# Patient Record
Sex: Female | Born: 1955 | Race: White | Hispanic: No | Marital: Married | Smoking: Current some day smoker
Health system: Southern US, Community
[De-identification: ages and names within clinical notes are randomized; demographics above are authoritative.]

## PROBLEM LIST (undated history)

## (undated) DIAGNOSIS — F410 Panic disorder [episodic paroxysmal anxiety] without agoraphobia: Secondary | ICD-10-CM

## (undated) DIAGNOSIS — N189 Chronic kidney disease, unspecified: Secondary | ICD-10-CM

## (undated) DIAGNOSIS — F319 Bipolar disorder, unspecified: Secondary | ICD-10-CM

## (undated) DIAGNOSIS — N1330 Unspecified hydronephrosis: Secondary | ICD-10-CM

## (undated) DIAGNOSIS — E785 Hyperlipidemia, unspecified: Secondary | ICD-10-CM

## (undated) DIAGNOSIS — R739 Hyperglycemia, unspecified: Secondary | ICD-10-CM

## (undated) DIAGNOSIS — K219 Gastro-esophageal reflux disease without esophagitis: Secondary | ICD-10-CM

## (undated) DIAGNOSIS — R319 Hematuria, unspecified: Secondary | ICD-10-CM

## (undated) DIAGNOSIS — J302 Other seasonal allergic rhinitis: Secondary | ICD-10-CM

## (undated) DIAGNOSIS — F329 Major depressive disorder, single episode, unspecified: Secondary | ICD-10-CM

## (undated) DIAGNOSIS — H269 Unspecified cataract: Secondary | ICD-10-CM

## (undated) DIAGNOSIS — E7800 Pure hypercholesterolemia, unspecified: Secondary | ICD-10-CM

## (undated) DIAGNOSIS — N289 Disorder of kidney and ureter, unspecified: Secondary | ICD-10-CM

## (undated) DIAGNOSIS — N6019 Diffuse cystic mastopathy of unspecified breast: Secondary | ICD-10-CM

## (undated) DIAGNOSIS — I10 Essential (primary) hypertension: Secondary | ICD-10-CM

## (undated) DIAGNOSIS — E876 Hypokalemia: Secondary | ICD-10-CM

## (undated) DIAGNOSIS — T7840XA Allergy, unspecified, initial encounter: Secondary | ICD-10-CM

## (undated) HISTORY — DX: Unspecified hydronephrosis: N13.30

## (undated) HISTORY — DX: Allergy, unspecified, initial encounter: T78.40XA

## (undated) HISTORY — DX: Disorder of kidney and ureter, unspecified: N28.9

## (undated) HISTORY — DX: Major depressive disorder, single episode, unspecified: F32.9

## (undated) HISTORY — DX: Diffuse cystic mastopathy of unspecified breast: N60.19

## (undated) HISTORY — DX: Unspecified cataract: H26.9

## (undated) HISTORY — DX: Hematuria, unspecified: R31.9

## (undated) HISTORY — DX: Chronic kidney disease, unspecified: N18.9

## (undated) HISTORY — DX: Bipolar disorder, unspecified: F31.9

## (undated) HISTORY — PX: ABDOMINAL HYSTERECTOMY: SHX81

## (undated) HISTORY — DX: Pure hypercholesterolemia, unspecified: E78.00

## (undated) HISTORY — PX: CHOLECYSTECTOMY: SHX55

## (undated) HISTORY — DX: Hyperlipidemia, unspecified: E78.5

## (undated) HISTORY — DX: Hypokalemia: E87.6

## (undated) HISTORY — PX: EYE SURGERY: SHX253

## (undated) HISTORY — DX: Hyperglycemia, unspecified: R73.9

## (undated) HISTORY — PX: KIDNEY SURGERY: SHX687

## (undated) HISTORY — DX: Hypercalcemia: E83.52

## (undated) HISTORY — DX: Essential (primary) hypertension: I10

## (undated) HISTORY — PX: TUBAL LIGATION: SHX77

## (undated) HISTORY — DX: Panic disorder (episodic paroxysmal anxiety): F41.0

---

## 1994-05-04 HISTORY — PX: OOPHORECTOMY: SHX86

## 2008-08-24 DIAGNOSIS — I10 Essential (primary) hypertension: Secondary | ICD-10-CM

## 2008-08-24 DIAGNOSIS — F329 Major depressive disorder, single episode, unspecified: Secondary | ICD-10-CM

## 2008-08-24 DIAGNOSIS — F32A Depression, unspecified: Secondary | ICD-10-CM

## 2008-08-24 DIAGNOSIS — R55 Syncope and collapse: Secondary | ICD-10-CM | POA: Insufficient documentation

## 2008-08-24 DIAGNOSIS — G4700 Insomnia, unspecified: Secondary | ICD-10-CM | POA: Insufficient documentation

## 2008-08-24 DIAGNOSIS — R319 Hematuria, unspecified: Secondary | ICD-10-CM

## 2008-08-24 DIAGNOSIS — N809 Endometriosis, unspecified: Secondary | ICD-10-CM | POA: Insufficient documentation

## 2008-08-24 DIAGNOSIS — E785 Hyperlipidemia, unspecified: Secondary | ICD-10-CM

## 2008-08-24 DIAGNOSIS — F319 Bipolar disorder, unspecified: Secondary | ICD-10-CM

## 2008-08-24 DIAGNOSIS — N189 Chronic kidney disease, unspecified: Secondary | ICD-10-CM | POA: Insufficient documentation

## 2008-08-24 HISTORY — DX: Essential (primary) hypertension: I10

## 2008-08-24 HISTORY — DX: Major depressive disorder, single episode, unspecified: F32.9

## 2008-08-24 HISTORY — DX: Bipolar disorder, unspecified: F31.9

## 2008-08-24 HISTORY — DX: Hyperlipidemia, unspecified: E78.5

## 2008-08-24 HISTORY — DX: Depression, unspecified: F32.A

## 2008-08-24 HISTORY — DX: Hematuria, unspecified: R31.9

## 2008-09-21 DIAGNOSIS — F17200 Nicotine dependence, unspecified, uncomplicated: Secondary | ICD-10-CM | POA: Insufficient documentation

## 2009-01-29 DIAGNOSIS — E7800 Pure hypercholesterolemia, unspecified: Secondary | ICD-10-CM

## 2009-01-29 HISTORY — DX: Pure hypercholesterolemia, unspecified: E78.00

## 2009-02-07 DIAGNOSIS — Z09 Encounter for follow-up examination after completed treatment for conditions other than malignant neoplasm: Secondary | ICD-10-CM | POA: Insufficient documentation

## 2009-02-07 DIAGNOSIS — E559 Vitamin D deficiency, unspecified: Secondary | ICD-10-CM | POA: Insufficient documentation

## 2009-03-12 ENCOUNTER — Ambulatory Visit: Payer: Self-pay | Admitting: Internal Medicine

## 2009-07-19 DIAGNOSIS — N399 Disorder of urinary system, unspecified: Secondary | ICD-10-CM | POA: Insufficient documentation

## 2009-07-19 DIAGNOSIS — N289 Disorder of kidney and ureter, unspecified: Secondary | ICD-10-CM

## 2009-07-19 HISTORY — DX: Hypercalcemia: E83.52

## 2009-07-19 HISTORY — DX: Disorder of kidney and ureter, unspecified: N28.9

## 2010-05-04 HISTORY — PX: BREAST BIOPSY: SHX20

## 2010-07-07 ENCOUNTER — Ambulatory Visit: Payer: Self-pay

## 2010-09-22 ENCOUNTER — Ambulatory Visit: Payer: Self-pay | Admitting: General Surgery

## 2010-09-23 LAB — PATHOLOGY REPORT

## 2011-08-13 ENCOUNTER — Ambulatory Visit: Payer: Self-pay | Admitting: Family Medicine

## 2012-05-04 HISTORY — PX: BREAST BIOPSY: SHX20

## 2012-08-29 DIAGNOSIS — I10 Essential (primary) hypertension: Secondary | ICD-10-CM | POA: Insufficient documentation

## 2012-08-29 DIAGNOSIS — N281 Cyst of kidney, acquired: Secondary | ICD-10-CM | POA: Insufficient documentation

## 2012-08-29 DIAGNOSIS — Z8639 Personal history of other endocrine, nutritional and metabolic disease: Secondary | ICD-10-CM | POA: Insufficient documentation

## 2012-08-29 DIAGNOSIS — N182 Chronic kidney disease, stage 2 (mild): Secondary | ICD-10-CM | POA: Insufficient documentation

## 2012-11-18 ENCOUNTER — Ambulatory Visit: Payer: Self-pay

## 2013-07-14 LAB — HEPATIC FUNCTION PANEL
ALT: 20 U/L (ref 7–35)
AST: 24 U/L (ref 13–35)

## 2013-07-14 LAB — BASIC METABOLIC PANEL
BUN: 17 mg/dL (ref 4–21)
Creatinine: 1.3 mg/dL — AB (ref 0.5–1.1)
Glucose: 110 mg/dL
POTASSIUM: 3.6 mmol/L (ref 3.4–5.3)
Sodium: 140 mmol/L (ref 137–147)

## 2013-07-14 LAB — LIPID PANEL
Cholesterol: 190 mg/dL (ref 0–200)
HDL: 51 mg/dL (ref 35–70)
LDL CALC: 102 mg/dL
Triglycerides: 187 mg/dL — AB (ref 40–160)

## 2013-07-14 LAB — HEMOGLOBIN A1C: Hgb A1c MFr Bld: 5.2 % (ref 4.0–6.0)

## 2014-12-06 DIAGNOSIS — J309 Allergic rhinitis, unspecified: Secondary | ICD-10-CM | POA: Insufficient documentation

## 2014-12-06 DIAGNOSIS — N6019 Diffuse cystic mastopathy of unspecified breast: Secondary | ICD-10-CM | POA: Insufficient documentation

## 2014-12-06 DIAGNOSIS — M79673 Pain in unspecified foot: Secondary | ICD-10-CM | POA: Insufficient documentation

## 2014-12-06 DIAGNOSIS — R739 Hyperglycemia, unspecified: Secondary | ICD-10-CM | POA: Insufficient documentation

## 2014-12-06 DIAGNOSIS — F419 Anxiety disorder, unspecified: Secondary | ICD-10-CM | POA: Insufficient documentation

## 2014-12-06 DIAGNOSIS — R799 Abnormal finding of blood chemistry, unspecified: Secondary | ICD-10-CM | POA: Insufficient documentation

## 2014-12-06 DIAGNOSIS — N289 Disorder of kidney and ureter, unspecified: Secondary | ICD-10-CM | POA: Insufficient documentation

## 2014-12-06 DIAGNOSIS — N261 Atrophy of kidney (terminal): Secondary | ICD-10-CM | POA: Insufficient documentation

## 2014-12-06 DIAGNOSIS — Z8659 Personal history of other mental and behavioral disorders: Secondary | ICD-10-CM | POA: Insufficient documentation

## 2014-12-06 DIAGNOSIS — R7989 Other specified abnormal findings of blood chemistry: Secondary | ICD-10-CM | POA: Insufficient documentation

## 2014-12-06 DIAGNOSIS — B379 Candidiasis, unspecified: Secondary | ICD-10-CM | POA: Insufficient documentation

## 2014-12-06 DIAGNOSIS — R031 Nonspecific low blood-pressure reading: Secondary | ICD-10-CM | POA: Insufficient documentation

## 2014-12-06 DIAGNOSIS — R0602 Shortness of breath: Secondary | ICD-10-CM | POA: Insufficient documentation

## 2014-12-06 DIAGNOSIS — Z87448 Personal history of other diseases of urinary system: Secondary | ICD-10-CM | POA: Insufficient documentation

## 2014-12-06 DIAGNOSIS — R928 Other abnormal and inconclusive findings on diagnostic imaging of breast: Secondary | ICD-10-CM | POA: Insufficient documentation

## 2014-12-06 DIAGNOSIS — R945 Abnormal results of liver function studies: Secondary | ICD-10-CM | POA: Insufficient documentation

## 2014-12-06 DIAGNOSIS — E7800 Pure hypercholesterolemia, unspecified: Secondary | ICD-10-CM | POA: Insufficient documentation

## 2014-12-06 DIAGNOSIS — E876 Hypokalemia: Secondary | ICD-10-CM | POA: Insufficient documentation

## 2014-12-06 DIAGNOSIS — R109 Unspecified abdominal pain: Secondary | ICD-10-CM | POA: Insufficient documentation

## 2014-12-07 ENCOUNTER — Encounter: Payer: Self-pay | Admitting: Physician Assistant

## 2014-12-07 ENCOUNTER — Ambulatory Visit (INDEPENDENT_AMBULATORY_CARE_PROVIDER_SITE_OTHER): Payer: Managed Care, Other (non HMO) | Admitting: Physician Assistant

## 2014-12-07 VITALS — BP 104/70 | HR 70 | Temp 97.6°F | Resp 16 | Wt 179.0 lb

## 2014-12-07 DIAGNOSIS — N182 Chronic kidney disease, stage 2 (mild): Secondary | ICD-10-CM | POA: Diagnosis not present

## 2014-12-07 DIAGNOSIS — R739 Hyperglycemia, unspecified: Secondary | ICD-10-CM

## 2014-12-07 DIAGNOSIS — R5383 Other fatigue: Secondary | ICD-10-CM

## 2014-12-07 DIAGNOSIS — G4700 Insomnia, unspecified: Secondary | ICD-10-CM

## 2014-12-07 DIAGNOSIS — I10 Essential (primary) hypertension: Secondary | ICD-10-CM | POA: Diagnosis not present

## 2014-12-07 DIAGNOSIS — E7800 Pure hypercholesterolemia, unspecified: Secondary | ICD-10-CM

## 2014-12-07 DIAGNOSIS — R945 Abnormal results of liver function studies: Secondary | ICD-10-CM

## 2014-12-07 DIAGNOSIS — R7989 Other specified abnormal findings of blood chemistry: Secondary | ICD-10-CM | POA: Diagnosis not present

## 2014-12-07 MED ORDER — ZOLPIDEM TARTRATE 10 MG PO TABS: 10.0000 mg | ORAL_TABLET | Freq: Every evening | ORAL | Status: DC | PRN

## 2014-12-07 NOTE — Patient Instructions (Signed)
Insomnia Insomnia is frequent trouble falling and/or staying asleep. Insomnia can be a long term problem or a short term problem. Both are common. Insomnia can be a short term problem when the wakefulness is related to a certain stress or worry. Long term insomnia is often related to ongoing stress during waking hours and/or poor sleeping habits. Overtime, sleep deprivation itself can make the problem worse. Every little thing feels more severe because you are overtired and your ability to cope is decreased. CAUSES   Stress, anxiety, and depression.  Poor sleeping habits.  Distractions such as TV in the bedroom.  Naps close to bedtime.  Engaging in emotionally charged conversations before bed.  Technical reading before sleep.  Alcohol and other sedatives. They may make the problem worse. They can hurt normal sleep patterns and normal dream activity.  Stimulants such as caffeine for several hours prior to bedtime.  Pain syndromes and shortness of breath can cause insomnia.  Exercise late at night.  Changing time zones may cause sleeping problems (jet lag). It is sometimes helpful to have someone observe your sleeping patterns. They should look for periods of not breathing during the night (sleep apnea). They should also look to see how long those periods last. If you live alone or observers are uncertain, you can also be observed at a sleep clinic where your sleep patterns will be professionally monitored. Sleep apnea requires a checkup and treatment. Give your caregivers your medical history. Give your caregivers observations your family has made about your sleep.  SYMPTOMS   Not feeling rested in the morning.  Anxiety and restlessness at bedtime.  Difficulty falling and staying asleep. TREATMENT   Your caregiver may prescribe treatment for an underlying medical disorders. Your caregiver can give advice or help if you are using alcohol or other drugs for self-medication. Treatment  of underlying problems will usually eliminate insomnia problems.  Medications can be prescribed for short time use. They are generally not recommended for lengthy use.  Over-the-counter sleep medicines are not recommended for lengthy use. They can be habit forming.  You can promote easier sleeping by making lifestyle changes such as:  Using relaxation techniques that help with breathing and reduce muscle tension.  Exercising earlier in the day.  Changing your diet and the time of your last meal. No night time snacks.  Establish a regular time to go to bed.  Counseling can help with stressful problems and worry.  Soothing music and white noise may be helpful if there are background noises you cannot remove.  Stop tedious detailed work at least one hour before bedtime. HOME CARE INSTRUCTIONS   Keep a diary. Inform your caregiver about your progress. This includes any medication side effects. See your caregiver regularly. Take note of:  Times when you are asleep.  Times when you are awake during the night.  The quality of your sleep.  How you feel the next day. This information will help your caregiver care for you.  Get out of bed if you are still awake after 15 minutes. Read or do some quiet activity. Keep the lights down. Wait until you feel sleepy and go back to bed.  Keep regular sleeping and waking hours. Avoid naps.  Exercise regularly.  Avoid distractions at bedtime. Distractions include watching television or engaging in any intense or detailed activity like attempting to balance the household checkbook.  Develop a bedtime ritual. Keep a familiar routine of bathing, brushing your teeth, climbing into bed at the same   time each night, listening to soothing music. Routines increase the success of falling to sleep faster.  Use relaxation techniques. This can be using breathing and muscle tension release routines. It can also include visualizing peaceful scenes. You can  also help control troubling or intruding thoughts by keeping your mind occupied with boring or repetitive thoughts like the old concept of counting sheep. You can make it more creative like imagining planting one beautiful flower after another in your backyard garden.  During your day, work to eliminate stress. When this is not possible use some of the previous suggestions to help reduce the anxiety that accompanies stressful situations. MAKE SURE YOU:   Understand these instructions.  Will watch your condition.  Will get help right away if you are not doing well or get worse. Document Released: 04/17/2000 Document Revised: 07/13/2011 Document Reviewed: 05/18/2007 Exit[employer] Patient Information 2015 [employer]. This information is not intended to replace advice given to you by your health care provider. Make sure you discuss any questions you have with your health care provider.  

## 2014-12-07 NOTE — Progress Notes (Signed)
Patient: Stephanie Ball Female    DOB: 12/25/1955   59 y.o.   MRN: [id number] Visit Date: 12/07/2014  Today's Provider: Mar Daring, PA-C   Chief Complaint  Patient presents with  . Medication Refill   Subjective:    HPI Stephanie Ball is a 59 year old female that returns to the office today for follow-up of her insomnia. She reports she has to take her Ambien nightly but it does help her sleep the majority of the time. Her husband does say that on occasion she will have nights where she doesn't fall asleep immediately, but she does eventually get to sleep and sleep throughout the night. She is also wanting to get her labs today for her 6 month rechecks. 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    Allergies  Allergen Reactions  . Amoxicillin-Pot Clavulanate   . Aciphex  [Rabeprazole Sodium]   . Iron   . Macrobid  [Nitrofurantoin Monohyd Macro]    Previous Medications   ASPIRIN 81 MG TABLET    Take 2 tablets by mouth at bedtime.   BUSPIRONE (BUSPAR) 15 MG TABLET    Take by mouth.   CLOTRIMAZOLE-BETAMETHASONE (LOTRISONE) CREAM    CLOTRIMAZOLE-BETAMETHASONE, 1-0.05% (External Cream)  1 Cream Apply twice a day to affected area for 0 days  Quantity: 45;  Refills: 2   Ordered :09-Aug-2012  Maurine Minister FNP;  Started 09-Aug-2012 Active   HYDROCHLOROTHIAZIDE (HYDRODIURIL) 12.5 MG TABLET    Take by mouth.   LISINOPRIL (PRINIVIL,ZESTRIL) 40 MG TABLET    Take by mouth.   LOVASTATIN (MEVACOR) 40 MG TABLET    Take by mouth.   MONTELUKAST (SINGULAIR) 10 MG TABLET    Take by mouth.   OLANZAPINE (ZYPREXA) 15 MG TABLET    Take by mouth.   OMEGA-3 FATTY ACIDS (FISH OIL) 1000 MG CAPS    Take 5 capsules by mouth daily.   POTASSIUM CHLORIDE (MICRO-K) 10 MEQ CR CAPSULE    Take by mouth.   PSYLLIUM (METAMUCIL) 0.52 G CAPSULE    Take 1 capsule by mouth daily.   TRAZODONE (DESYREL) 100 MG TABLET    Take by mouth.   VENLAFAXINE XR (EFFEXOR XR) 75 MG 24 HR CAPSULE    Take by mouth.   ZOLPIDEM (AMBIEN) 10 MG TABLET    Take by mouth.    Review of Systems  Constitutional: Negative.   HENT: Negative.   Eyes: Negative.   Respiratory: Negative.   Cardiovascular: Negative.   Gastrointestinal: Negative.   Endocrine: Negative.   Genitourinary: Negative.   Musculoskeletal: Negative.   Skin: Negative.   Allergic/Immunologic: Negative.   Neurological: Negative.   Hematological: Negative.   Psychiatric/Behavioral: Negative.     History  Substance Use Topics  . Smoking status: Smoker, Current Status Unknown  . Smokeless tobacco: Never Used     Comment: would like to quit; as stated pt smoked 1/2 PPd for 25 years and now is smoking 5-6 cigarettes per day.  . Alcohol Use: No   Objective:   There were no vitals taken for this visit.  Physical Exam  Constitutional: She appears well-developed and well-nourished. No distress.  Neck: Normal range of motion. Neck supple. No JVD present. No tracheal deviation present. No thyromegaly present.  Cardiovascular: Normal rate, regular rhythm and normal heart sounds.  Exam reveals no gallop and no friction rub.   No  murmur heard. Pulmonary/Chest: Effort normal and breath sounds normal. No respiratory distress. She has no wheezes. She has no rales.  Lymphadenopathy:    She has no cervical adenopathy.  Skin: She is not diaphoretic.  Vitals reviewed.       Assessment & Plan:     1. Insomnia Stable. Continue current medical treatment plan.  We will follow up in 6 months. - zolpidem (AMBIEN) 10 MG tablet; Take 1 tablet (10 mg total) by mouth at bedtime as needed for sleep.  Dispense: 30 tablet; Refill: 5  2. Benign essential HTN Well-controlled. Continue current medical treatment plan. Will check labs today and follow-up pending lab results. If labs are stable will follow-up in 6 months.  3. Chronic kidney  disease, stage 2 (mild) Will check labs. Follow-up pending labs. - Comprehensive Metabolic Panel (CMET)  4. Abnormal LFTs Will check labs. Follow up pending labs. - Comprehensive Metabolic Panel (CMET)  5. Hypercholesteremia Will check labs. Follow up pending labs. - Lipid panel  6. Blood glucose elevated Will check labs. Follow up pending labs. - HgB A1c - CBC with Differential  7. Other fatigue Will check TSH. Follow-up pending results. If labs are stable will follow-up in 6 months. - TSH - CBC with Differential       Mar Daring, PA-C  [facility]

## 2015-01-01 ENCOUNTER — Encounter: Payer: Self-pay | Admitting: Physician Assistant

## 2015-05-22 ENCOUNTER — Other Ambulatory Visit: Payer: Self-pay | Admitting: Family Medicine

## 2015-05-31 ENCOUNTER — Ambulatory Visit (INDEPENDENT_AMBULATORY_CARE_PROVIDER_SITE_OTHER): Payer: Managed Care, Other (non HMO) | Admitting: Family Medicine

## 2015-05-31 ENCOUNTER — Encounter: Payer: Self-pay | Admitting: Family Medicine

## 2015-05-31 VITALS — BP 118/88 | HR 104 | Temp 97.9°F | Resp 16 | Wt 184.6 lb

## 2015-05-31 DIAGNOSIS — H66003 Acute suppurative otitis media without spontaneous rupture of ear drum, bilateral: Secondary | ICD-10-CM | POA: Diagnosis not present

## 2015-05-31 DIAGNOSIS — J0100 Acute maxillary sinusitis, unspecified: Secondary | ICD-10-CM | POA: Diagnosis not present

## 2015-05-31 MED ORDER — AZITHROMYCIN 250 MG PO TABS: ORAL_TABLET | ORAL | Status: DC

## 2015-05-31 NOTE — Progress Notes (Signed)
Patient ID: Stephanie Ball, female   DOB: [birth year], 60 y.o.   MRN: [id number]   Patient: Stephanie Ball Female    DOB: 1955/05/10   60 y.o.   MRN: [id number] Visit Date: 05/31/2015  Today's Provider: Vernie Murders, PA   Chief Complaint  Patient presents with  . URI   Subjective:    URI  This is a new problem. The current episode started 1 to 4 weeks ago (2 weeks). There has been no fever. Associated symptoms include congestion, coughing, ear pain, sneezing and a sore throat. Pertinent negatives include no headaches. Associated symptoms comments: Upper teeth ache.. She has tried decongestant, antihistamine and acetaminophen for the symptoms. Improvement on treatment: Dayquil helped sore throat.   Patient Active Problem List   Diagnosis Date Noted  . Insomnia 12/07/2014  . Anxiety 12/06/2014  . Allergic rhinitis 12/06/2014  . 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 Candidiasis 12/06/2014  . Abnormal LFTs 12/06/2014  . Bloodgood disease 12/06/2014  . Foot pain 12/06/2014  . Hydronephrosis 12/06/2014  . Hypercholesteremia 12/06/2014  . Blood glucose elevated 12/06/2014  . Decreased potassium in the blood 12/06/2014  . Arterial blood pressure decreased 12/06/2014  . Disorder of kidney 12/06/2014  . Panic attack 12/06/2014  . Breath shortness 12/06/2014  . Abnormal mammogram 12/06/2014  . Abnormal blood chemistry 12/06/2014  . Abdominal pain 12/06/2014  . Chronic kidney disease (CKD), stage II (mild) 08/29/2012  . BP (high blood pressure) 08/29/2012  . History of metabolic disorder [id number]  . Kidney cysts 08/29/2012  . Urinary system disease 07/19/2009  . Calcium blood increased 07/19/2009  . Follow-up examination following treatment with high-risk medication 02/07/2009  . Avitaminosis D 02/07/2009  . Hypercholesterolemia without hypertriglyceridemia 01/29/2009  . Compulsive tobacco user syndrome 09/21/2008  . Affective bipolar disorder ([place]) 08/24/2008  . Chronic kidney  disease 08/24/2008  . Clinical depression 08/24/2008  . Endometriosis 08/24/2008  . Blood in the urine 08/24/2008  . HLD (hyperlipidemia) 08/24/2008  . Benign essential HTN 08/24/2008  . Cannot sleep 08/24/2008  . Syncope and collapse 08/24/2008   Past Surgical History  Procedure Laterality Date  . Oophorectomy Bilateral 1996    Dr, DeFrancisco  . Abdominal hysterectomy      abdominal:due to fibroid and endometriosis. No history of abnormal paps. Ovaries removed also.  . Tubal ligation    . Cholecystectomy      no further information given  . Kidney surgery      as stated pt had kidney surgery with no further given   Family History  Problem Relation Age of Onset  . Diabetes Father   . Cancer Paternal Grandmother     skin     Previous Medications   ALPRAZOLAM (XANAX) 0.5 MG TABLET    Take 0.5 mg by mouth.   ASPIRIN 81 MG TABLET    Take 2 tablets by mouth at bedtime.   BUSPIRONE (BUSPAR) 15 MG TABLET    Take by mouth.   CLOTRIMAZOLE-BETAMETHASONE (LOTRISONE) CREAM    CLOTRIMAZOLE-BETAMETHASONE, 1-0.05% (External Cream)  1 Cream Apply twice a day to affected area for 0 days  Quantity: 45;  Refills: 2   Ordered :09-Aug-2012  Maurine Minister FNP;  Started 09-Aug-2012 Active   HYDROCHLOROTHIAZIDE (HYDRODIURIL) 12.5 MG TABLET    Take by mouth.   LISINOPRIL (PRINIVIL,ZESTRIL) 40 MG TABLET    Take by mouth.   LOVASTATIN (MEVACOR) 40 MG TABLET    Take by mouth.   MONTELUKAST (SINGULAIR) 10 MG TABLET    TAKE 1  TABLET BY MOUTH EVERY DAY   OLANZAPINE (ZYPREXA) 15 MG TABLET    Take by mouth.   OMEGA-3 FATTY ACIDS (FISH OIL) 1000 MG CAPS    Take 5 capsules by mouth daily.   POTASSIUM CHLORIDE (MICRO-K) 10 MEQ CR CAPSULE    Take by mouth.   PSYLLIUM (METAMUCIL) 0.52 G CAPSULE    Take 1 capsule by mouth daily.   TRAZODONE (DESYREL) 100 MG TABLET    Take by mouth.   VENLAFAXINE XR (EFFEXOR XR) 75 MG 24 HR CAPSULE    Take by mouth.   ZOLPIDEM (AMBIEN) 10 MG TABLET    Take 1 tablet (10 mg  total) by mouth at bedtime as needed for sleep.   Allergies  Allergen Reactions  . Amoxicillin-Pot Clavulanate   . Aciphex  [Rabeprazole Sodium]   . Iron   . Metoprolol Other (See Comments)    After taking medication for about 72 hours she noted elevated systolic BP and swelling of hands and feet and discontinued.  Marland Kitchen Macrobid  [Nitrofurantoin Monohyd Macro]      Review of Systems  Constitutional: Negative.   HENT: Positive for congestion, ear pain, sneezing and sore throat.   Eyes: Negative.   Respiratory: Positive for cough.   Cardiovascular: Negative.   Gastrointestinal: Negative.   Endocrine: Negative.   Genitourinary: Negative.   Musculoskeletal: Negative.   Skin: Negative.   Allergic/Immunologic: Negative.   Neurological: Negative.  Negative for headaches.  Hematological: Negative.   Psychiatric/Behavioral: Negative.     Social History  Substance Use Topics  . Smoking status: Current Every Day Smoker  . Smokeless tobacco: Never Used     Comment: would like to quit; as stated pt smoked 1/2 PPd for 25 years and now is smoking 5-6 cigarettes per day.  . Alcohol Use: No   Objective:   BP 118/88 mmHg  Pulse 104  Temp(Src) 97.9 F (36.6 C) (Oral)  Resp 16  Wt 184 lb 9.6 oz (83.734 kg)  SpO2 99%  Physical Exam  Constitutional: She is oriented to person, place, and time. She appears well-developed and well-nourished. No distress.  HENT:  Head: Normocephalic and atraumatic.  Right Ear: Hearing normal.  Left Ear: Hearing normal.  Nose: Nose normal.  Opaque and cream colored TM's. Cloudy maxillary sinuses to test transillumination. Slightly reddened posterior pharynx.  Eyes: Conjunctivae, EOM and lids are normal. Right eye exhibits no discharge. Left eye exhibits no discharge. No scleral icterus.  Neck: Normal range of motion. Neck supple.  Cardiovascular: Normal rate and regular rhythm.   Pulmonary/Chest: Effort normal. No respiratory distress.  Abdominal: Bowel  sounds are normal.  Musculoskeletal: Normal range of motion.  Lymphadenopathy:    She has no cervical adenopathy.  Neurological: She is alert and oriented to person, place, and time.  Skin: Skin is intact. No lesion and no rash noted.  Psychiatric: She has a normal mood and affect. Her speech is normal and behavior is normal. Thought content normal.      Assessment & Plan:     1. Subacute maxillary sinusitis Onset with URI symptoms over the past 2 weeks after husband had similar symptoms. No fever or chills. May use Dayquil and/or Mucinex with Flonase-OTC. Increase fluid intake and add Z-pak. Recheck prn. - azithromycin (ZITHROMAX) 250 MG tablet; Two tablets by mouth today then one daily for 4 days.  Dispense: 6 tablet; Refill: 0  2. Suppurative otitis media of both ears without spontaneous rupture of tympanic membrane, recurrence not specified,  unspecified chronicity Onset with sinusitis drainage and ears feel stopped up. No sign of TM rupture or drainage from ears. Treat with antibiotic and Dayquil with Mucinex and/or Flonase. Recheck prn 1 week.

## 2015-05-31 NOTE — Patient Instructions (Signed)

## 2015-06-10 ENCOUNTER — Telehealth: Payer: Self-pay | Admitting: Family Medicine

## 2015-06-10 ENCOUNTER — Encounter: Payer: Self-pay | Admitting: Physician Assistant

## 2015-06-10 ENCOUNTER — Ambulatory Visit (INDEPENDENT_AMBULATORY_CARE_PROVIDER_SITE_OTHER): Payer: Managed Care, Other (non HMO) | Admitting: Physician Assistant

## 2015-06-10 VITALS — BP 140/90 | HR 116 | Temp 98.8°F | Resp 16 | Wt 183.4 lb

## 2015-06-10 DIAGNOSIS — I10 Essential (primary) hypertension: Secondary | ICD-10-CM | POA: Diagnosis not present

## 2015-06-10 DIAGNOSIS — H66002 Acute suppurative otitis media without spontaneous rupture of ear drum, left ear: Secondary | ICD-10-CM

## 2015-06-10 DIAGNOSIS — G4700 Insomnia, unspecified: Secondary | ICD-10-CM | POA: Diagnosis not present

## 2015-06-10 MED ORDER — HYDROCHLOROTHIAZIDE 12.5 MG PO TABS: 12.5000 mg | ORAL_TABLET | Freq: Every day | ORAL | Status: DC

## 2015-06-10 MED ORDER — OLANZAPINE 15 MG PO TABS: 15.0000 mg | ORAL_TABLET | Freq: Every day | ORAL | Status: DC

## 2015-06-10 MED ORDER — ZOLPIDEM TARTRATE 10 MG PO TABS: 10.0000 mg | ORAL_TABLET | Freq: Every evening | ORAL | Status: DC | PRN

## 2015-06-10 MED ORDER — MONTELUKAST SODIUM 10 MG PO TABS: 10.0000 mg | ORAL_TABLET | Freq: Every day | ORAL | Status: DC

## 2015-06-10 MED ORDER — VENLAFAXINE HCL ER 75 MG PO CP24: 75.0000 mg | ORAL_CAPSULE | Freq: Every day | ORAL | Status: DC

## 2015-06-10 MED ORDER — CLARITHROMYCIN 500 MG PO TABS: 500.0000 mg | ORAL_TABLET | Freq: Two times a day (BID) | ORAL | Status: DC

## 2015-06-10 MED ORDER — LOVASTATIN 40 MG PO TABS: 40.0000 mg | ORAL_TABLET | Freq: Every day | ORAL | Status: DC

## 2015-06-10 MED ORDER — TRAZODONE HCL 100 MG PO TABS: 100.0000 mg | ORAL_TABLET | Freq: Every evening | ORAL | Status: DC | PRN

## 2015-06-10 MED ORDER — LISINOPRIL 40 MG PO TABS: 40.0000 mg | ORAL_TABLET | Freq: Every day | ORAL | Status: DC

## 2015-06-10 MED ORDER — BUSPIRONE HCL 15 MG PO TABS: 15.0000 mg | ORAL_TABLET | Freq: Three times a day (TID) | ORAL | Status: DC

## 2015-06-10 NOTE — Telephone Encounter (Signed)
Sent to me by mistake 

## 2015-06-10 NOTE — Patient Instructions (Signed)

## 2015-06-10 NOTE — Telephone Encounter (Signed)
Ok to refill for 90 days? Please advise. Thanks!

## 2015-06-10 NOTE — Progress Notes (Signed)
Patient: Stephanie Ball Female    DOB: 06-28-1955   60 y.o.   MRN: [id number] Visit Date: 06/10/2015  Today's Provider: Mar Daring, PA-C   Chief Complaint  Patient presents with  . Ear Pain   Subjective:    Otalgia  There is pain in the left ear. This is a new (Came two weeks to see Simona Huh and patient was prescribed Zpak) problem. The current episode started yesterday (Patient had blood yesterday in the morning, patient used a Qtip to clean her ear.). The problem occurs constantly. Progression since onset: can't hear. There has been no fever. Associated symptoms include ear discharge (blood). Pertinent negatives include no abdominal pain, coughing, headaches, rhinorrhea, sore throat or vomiting. She has tried antibiotics, acetaminophen and ear drops (Zpak, wax removal, peroxide.) for the symptoms. The treatment provided no relief.       Allergies  Allergen Reactions  . Amoxicillin-Pot Clavulanate   . Aciphex  [Rabeprazole Sodium]   . Iron   . Metoprolol Other (See Comments)    After taking medication for about 72 hours she noted elevated systolic BP and swelling of hands and feet and discontinued.  Marland Kitchen Macrobid  [Nitrofurantoin Monohyd Macro]    Previous Medications   ALPRAZOLAM (XANAX) 0.5 MG TABLET    Take 0.5 mg by mouth.   ASPIRIN 81 MG TABLET    Take 2 tablets by mouth at bedtime.   AZITHROMYCIN (ZITHROMAX) 250 MG TABLET    Two tablets by mouth today then one daily for 4 days.   BUSPIRONE (BUSPAR) 15 MG TABLET    Take by mouth.   CLOTRIMAZOLE-BETAMETHASONE (LOTRISONE) CREAM    CLOTRIMAZOLE-BETAMETHASONE, 1-0.05% (External Cream)  1 Cream Apply twice a day to affected area for 0 days  Quantity: 45;  Refills: 2   Ordered :09-Aug-2012  Maurine Minister FNP;  Started 09-Aug-2012 Active   HYDROCHLOROTHIAZIDE (HYDRODIURIL) 12.5 MG TABLET    Take by mouth.   LISINOPRIL (PRINIVIL,ZESTRIL) 40 MG TABLET    Take by mouth.   LOVASTATIN (MEVACOR) 40 MG TABLET    Take by  mouth.   MONTELUKAST (SINGULAIR) 10 MG TABLET    TAKE 1 TABLET BY MOUTH EVERY DAY   OLANZAPINE (ZYPREXA) 15 MG TABLET    Take by mouth.   OMEGA-3 FATTY ACIDS (FISH OIL) 1000 MG CAPS    Take 5 capsules by mouth daily.   POTASSIUM CHLORIDE (MICRO-K) 10 MEQ CR CAPSULE    Take by mouth.   PSYLLIUM (METAMUCIL) 0.52 G CAPSULE    Take 1 capsule by mouth daily.   TRAZODONE (DESYREL) 100 MG TABLET    Take by mouth.   VENLAFAXINE XR (EFFEXOR XR) 75 MG 24 HR CAPSULE    Take by mouth.   ZOLPIDEM (AMBIEN) 10 MG TABLET    Take 1 tablet (10 mg total) by mouth at bedtime as needed for sleep.    Review of Systems  Constitutional: Positive for fever (low grade; not meausred). Negative for chills and fatigue.  HENT: Positive for ear discharge (blood) and ear pain. Negative for congestion, postnasal drip, rhinorrhea, sinus pressure, sneezing, sore throat and tinnitus.   Respiratory: Negative for cough, chest tightness, shortness of breath and wheezing.   Cardiovascular: Negative for chest pain.  Gastrointestinal: Negative for nausea, vomiting and abdominal pain.  Neurological: Negative for dizziness and headaches.    Social History  Substance Use Topics  . Smoking status: Current Every Day Smoker  . Smokeless tobacco: Never Used  Comment: would like to quit; as stated pt smoked 1/2 PPd for 25 years and now is smoking 5-6 cigarettes per day.  . Alcohol Use: No   Objective:   BP 140/90 mmHg  Pulse 116  Temp(Src) 98.8 F (37.1 C) (Oral)  Resp 16  Wt 183 lb 6.4 oz (83.19 kg)  Physical Exam  Constitutional: She appears well-developed and well-nourished. No distress.  HENT:  Head: Normocephalic and atraumatic.  Right Ear: Hearing, tympanic membrane, external ear and ear canal normal.  Left Ear: Hearing, external ear and ear canal normal. Tympanic membrane is erythematous and bulging. Tympanic membrane is not perforated. A middle ear effusion is present.  Nose: Nose normal.  Mouth/Throat: Uvula is  midline, oropharynx is clear and moist and mucous membranes are normal. No oropharyngeal exudate.  Eyes: Conjunctivae are normal. Pupils are equal, round, and reactive to light. Right eye exhibits no discharge. Left eye exhibits no discharge. No scleral icterus.  Neck: Normal range of motion. Neck supple. No tracheal deviation present. No thyromegaly present.  Cardiovascular: Normal rate, regular rhythm and normal heart sounds.  Exam reveals no gallop and no friction rub.   No murmur heard. Pulmonary/Chest: Effort normal and breath sounds normal. No stridor. No respiratory distress. She has no wheezes. She has no rales.  Lymphadenopathy:    She has no cervical adenopathy.  Skin: Skin is warm and dry. She is not diaphoretic.  Vitals reviewed.       Assessment & Plan:     1. Acute suppurative otitis media of left ear without spontaneous rupture of tympanic membrane, recurrence not specified Worsening. Due to allergies I will use Biaxin as below. She may use Flonase for congestion. She may take Tylenol and/or ibuprofen as needed for fever and pain. She needs to make sure to stay well-hydrated and get plenty of rest. She is to call the office if symptoms fail to improve. - clarithromycin (BIAXIN) 500 MG tablet; Take 1 tablet (500 mg total) by mouth 2 (two) times daily.  Dispense: 20 tablet; Refill: 0  2. Essential hypertension Stable. Diagnosis was pulled to refill lisinopril and HCTZ. Continue current medical treatment plan.  3. Insomnia Stable with Ambien. Diagnosis was pulled for medication refill as below. Continue current medical treatment plan. - zolpidem (AMBIEN) 10 MG tablet; Take 1 tablet (10 mg total) by mouth at bedtime as needed for sleep.  Dispense: 30 tablet; Refill: [place], PA-C  [facility]

## 2015-06-10 NOTE — Telephone Encounter (Signed)
Pt called saying all of her prescriptions need to be for 90 days or insurance will not pay. She uses [facility].  She says she was just in to see you today.  Her call is [phone]  Thanks Con Memos

## 2015-06-11 MED ORDER — BUSPIRONE HCL 15 MG PO TABS: 15.0000 mg | ORAL_TABLET | Freq: Three times a day (TID) | ORAL | Status: DC

## 2015-06-11 MED ORDER — LISINOPRIL 40 MG PO TABS: 40.0000 mg | ORAL_TABLET | Freq: Every day | ORAL | Status: DC

## 2015-06-11 MED ORDER — VENLAFAXINE HCL ER 75 MG PO CP24: 75.0000 mg | ORAL_CAPSULE | Freq: Every day | ORAL | Status: DC

## 2015-06-11 MED ORDER — HYDROCHLOROTHIAZIDE 12.5 MG PO TABS: 12.5000 mg | ORAL_TABLET | Freq: Every day | ORAL | Status: DC

## 2015-06-11 MED ORDER — OLANZAPINE 15 MG PO TABS: 15.0000 mg | ORAL_TABLET | Freq: Every day | ORAL | Status: DC

## 2015-06-11 MED ORDER — MONTELUKAST SODIUM 10 MG PO TABS: 10.0000 mg | ORAL_TABLET | Freq: Every day | ORAL | Status: DC

## 2015-06-11 MED ORDER — LOVASTATIN 40 MG PO TABS: 40.0000 mg | ORAL_TABLET | Freq: Every day | ORAL | Status: DC

## 2015-06-11 MED ORDER — TRAZODONE HCL 100 MG PO TABS: 100.0000 mg | ORAL_TABLET | Freq: Every evening | ORAL | Status: DC | PRN

## 2015-06-11 NOTE — Telephone Encounter (Signed)
Advised patient as below.  

## 2015-06-11 NOTE — Telephone Encounter (Signed)
90 day Rx sent in to [facility]

## 2015-06-17 ENCOUNTER — Telehealth: Payer: Self-pay | Admitting: Family Medicine

## 2015-06-17 NOTE — Telephone Encounter (Signed)
Patient advised as directed below.  Thanks,  -Joseline 

## 2015-06-17 NOTE — Telephone Encounter (Signed)
Please advise.  Thanks,  -Kaeya Schiffer 

## 2015-06-17 NOTE — Telephone Encounter (Signed)
Pt is called stating her left ear is still stopped up.  Pt states she has been taking the Rx clarithromycin (BIAXIN) 500 MG tablet and it is helping a little.  Pt states she is still not able to hear.  [id number]

## 2015-06-17 NOTE — Telephone Encounter (Signed)
She can add flonase to see if that helps if she is not using already. She may also could try coricidin HBP for congestion that wont affect her BP. She can do both also, and that may help the best.  If desperate she could do sudafed and flonase instead but just know sudafed is going to increase her BP. If she uses sudafed I do not recommend using for more than one week.

## 2015-06-26 ENCOUNTER — Other Ambulatory Visit: Payer: Self-pay | Admitting: Family Medicine

## 2015-07-27 ENCOUNTER — Other Ambulatory Visit: Payer: Self-pay | Admitting: Family Medicine

## 2015-08-23 ENCOUNTER — Other Ambulatory Visit: Payer: Self-pay | Admitting: Family Medicine

## 2015-08-23 DIAGNOSIS — F319 Bipolar disorder, unspecified: Secondary | ICD-10-CM

## 2015-08-26 NOTE — Telephone Encounter (Signed)
Stephanie Ball is requesting refill on her her Zyprexa Thanks Elena

## 2015-09-24 ENCOUNTER — Other Ambulatory Visit: Payer: Self-pay

## 2015-09-24 MED ORDER — VENLAFAXINE HCL ER 75 MG PO CP24: 225.0000 mg | ORAL_CAPSULE | Freq: Every day | ORAL | Status: DC

## 2015-09-26 ENCOUNTER — Other Ambulatory Visit: Payer: Self-pay

## 2015-09-26 DIAGNOSIS — F32A Depression, unspecified: Secondary | ICD-10-CM

## 2015-09-26 DIAGNOSIS — F329 Major depressive disorder, single episode, unspecified: Secondary | ICD-10-CM

## 2015-09-26 MED ORDER — VENLAFAXINE HCL ER 75 MG PO CP24: 225.0000 mg | ORAL_CAPSULE | Freq: Every day | ORAL | Status: DC

## 2015-12-06 ENCOUNTER — Ambulatory Visit (INDEPENDENT_AMBULATORY_CARE_PROVIDER_SITE_OTHER): Payer: Managed Care, Other (non HMO) | Admitting: Physician Assistant

## 2015-12-06 ENCOUNTER — Encounter: Payer: Self-pay | Admitting: Physician Assistant

## 2015-12-06 VITALS — BP 120/80 | HR 100 | Temp 98.1°F | Resp 16 | Wt 181.8 lb

## 2015-12-06 DIAGNOSIS — R945 Abnormal results of liver function studies: Secondary | ICD-10-CM

## 2015-12-06 DIAGNOSIS — F319 Bipolar disorder, unspecified: Secondary | ICD-10-CM

## 2015-12-06 DIAGNOSIS — J302 Other seasonal allergic rhinitis: Secondary | ICD-10-CM

## 2015-12-06 DIAGNOSIS — F32A Depression, unspecified: Secondary | ICD-10-CM

## 2015-12-06 DIAGNOSIS — I10 Essential (primary) hypertension: Secondary | ICD-10-CM | POA: Diagnosis not present

## 2015-12-06 DIAGNOSIS — F3170 Bipolar disorder, currently in remission, most recent episode unspecified: Secondary | ICD-10-CM | POA: Diagnosis not present

## 2015-12-06 DIAGNOSIS — F329 Major depressive disorder, single episode, unspecified: Secondary | ICD-10-CM

## 2015-12-06 DIAGNOSIS — R7989 Other specified abnormal findings of blood chemistry: Secondary | ICD-10-CM

## 2015-12-06 DIAGNOSIS — E7800 Pure hypercholesterolemia, unspecified: Secondary | ICD-10-CM | POA: Diagnosis not present

## 2015-12-06 DIAGNOSIS — G4700 Insomnia, unspecified: Secondary | ICD-10-CM

## 2015-12-06 MED ORDER — ZOLPIDEM TARTRATE 10 MG PO TABS: 10.0000 mg | ORAL_TABLET | Freq: Every evening | ORAL | 1 refills | Status: DC | PRN

## 2015-12-06 MED ORDER — LOVASTATIN 40 MG PO TABS: 40.0000 mg | ORAL_TABLET | Freq: Every day | ORAL | 3 refills | Status: DC

## 2015-12-06 MED ORDER — LISINOPRIL 40 MG PO TABS: 40.0000 mg | ORAL_TABLET | Freq: Every day | ORAL | 3 refills | Status: DC

## 2015-12-06 MED ORDER — HYDROCHLOROTHIAZIDE 12.5 MG PO TABS
12.5000 mg | ORAL_TABLET | Freq: Every day | ORAL | 3 refills | Status: DC
Start: 2015-12-06 — End: 2016-12-02

## 2015-12-06 MED ORDER — BUSPIRONE HCL 15 MG PO TABS: 15.0000 mg | ORAL_TABLET | Freq: Two times a day (BID) | ORAL | 3 refills | Status: DC

## 2015-12-06 MED ORDER — MONTELUKAST SODIUM 10 MG PO TABS: 10.0000 mg | ORAL_TABLET | Freq: Every day | ORAL | 3 refills | Status: DC

## 2015-12-06 MED ORDER — VENLAFAXINE HCL ER 75 MG PO CP24: 225.0000 mg | ORAL_CAPSULE | Freq: Every day | ORAL | 3 refills | Status: DC

## 2015-12-06 MED ORDER — TRAZODONE HCL 100 MG PO TABS: 100.0000 mg | ORAL_TABLET | Freq: Every evening | ORAL | 3 refills | Status: DC | PRN

## 2015-12-06 MED ORDER — OLANZAPINE 15 MG PO TABS: 15.0000 mg | ORAL_TABLET | Freq: Every day | ORAL | 3 refills | Status: DC

## 2015-12-06 NOTE — Patient Instructions (Signed)

## 2015-12-06 NOTE — Progress Notes (Signed)
Patient: Stephanie Ball Female    DOB: April 28, 1956   60 y.o.   MRN: [id number] Visit Date: 12/06/2015  Today's Provider: Mar Daring, PA-C   Chief Complaint  Patient presents with  . Medication Refill   Subjective:    HPI Patient is here for Medication Refills of all her medications. She reports feeling well. Denies chest pain/chest discomfort, leg swelling, blurry vision, headache, palpitations, or fatigue. She reports that her diet is well balanced, not exercising and sleeping well.    Allergies  Allergen Reactions  . Aciphex  [Rabeprazole Sodium]   . Amoxicillin-Pot Clavulanate   . Iron   . Macrobid  [employer] Macro]   . Metoprolol Other (See Comments)    After taking medication for about 72 hours she noted elevated systolic BP and swelling of hands and feet and discontinued.   Current Meds  Medication Sig  . aspirin 81 MG tablet Take 2 tablets by mouth at bedtime.  . busPIRone (BUSPAR) 15 MG tablet TAKE 1 TABLET BY MOUTH TWICE A DAY  . clotrimazole-betamethasone (LOTRISONE) cream CLOTRIMAZOLE-BETAMETHASONE, 1-0.05% (External Cream)  1 Cream Apply twice a day to affected area for 0 days  Quantity: 45;  Refills: 2   Ordered :09-Aug-2012  Maurine Minister FNP;  Started 09-Aug-2012 Active  . hydrochlorothiazide (HYDRODIURIL) 12.5 MG tablet TAKE 1 TABLET BY MOUTH EVERY DAY  . lisinopril (PRINIVIL,ZESTRIL) 40 MG tablet Take 1 tablet (40 mg total) by mouth daily.  Marland Kitchen lovastatin (MEVACOR) 40 MG tablet Take 1 tablet (40 mg total) by mouth at bedtime.  . montelukast (SINGULAIR) 10 MG tablet Take 1 tablet (10 mg total) by mouth daily.  Marland Kitchen OLANZapine (ZYPREXA) 15 MG tablet TAKE 1 TABLET BY MOUTH EVERY DAY  . traZODone (DESYREL) 100 MG tablet Take 1 tablet (100 mg total) by mouth at bedtime as needed for sleep.  Marland Kitchen venlafaxine XR (EFFEXOR-XR) 75 MG 24 hr capsule Take 3 capsules (225 mg total) by mouth daily.  Marland Kitchen zolpidem (AMBIEN) 10 MG tablet Take 1 tablet (10  mg total) by mouth at bedtime as needed for sleep.    Review of Systems  Constitutional: Negative for fatigue.  Eyes: Negative for visual disturbance.  Respiratory: Negative for cough, chest tightness and shortness of breath.   Cardiovascular: Negative for chest pain, palpitations and leg swelling.  Gastrointestinal: Negative for abdominal pain.  Neurological: Negative for dizziness, light-headedness and headaches.  Psychiatric/Behavioral: Negative.     Social History  Substance Use Topics  . Smoking status: Current Every Day Smoker  . Smokeless tobacco: Never Used     Comment: would like to quit; as stated pt smoked 1/2 PPd for 25 years and now is smoking 5-6 cigarettes per day.  . Alcohol use No   Objective:   BP 120/80 (BP Location: Right Arm, Patient Position: Sitting, Cuff Size: Normal)   Pulse 100   Temp 98.1 F (36.7 C) (Oral)   Resp 16   Wt 181 lb 12.8 oz (82.5 kg)   Physical Exam  Constitutional: She appears well-developed and well-nourished. No distress.  Neck: Normal range of motion. Neck supple. No tracheal deviation present. No thyromegaly present.  Cardiovascular: Normal rate, regular rhythm and normal heart sounds.  Exam reveals no gallop and no friction rub.   No murmur heard. Pulmonary/Chest: Effort normal and breath sounds normal. No respiratory distress. She has no wheezes. She has no rales.  Lymphadenopathy:    She has no cervical adenopathy.  Skin: She  is not diaphoretic.  Psychiatric: She has a normal mood and affect. Her behavior is normal. Judgment and thought content normal.  Vitals reviewed.     Assessment & Plan:     1. Bipolar disorder in full remission, most recent episode unspecified type ([place]) Stable. Diagnosis pulled for medication refill. Continue current medical treatment plan. - busPIRone (BUSPAR) 15 MG tablet; Take 1 tablet (15 mg total) by mouth 2 (two) times daily.  Dispense: 180 tablet; Refill: 3 - OLANZapine (ZYPREXA) 15 MG tablet;  Take 1 tablet (15 mg total) by mouth daily.  Dispense: 90 tablet; Refill: 3  2. Benign essential HTN Stable. Diagnosis pulled for medication refill. Continue current medical treatment plan. Will check labs as below and f/u pending results. - hydrochlorothiazide (HYDRODIURIL) 12.5 MG tablet; Take 1 tablet (12.5 mg total) by mouth daily.  Dispense: 90 tablet; Refill: 3 - lisinopril (PRINIVIL,ZESTRIL) 40 MG tablet; Take 1 tablet (40 mg total) by mouth daily.  Dispense: 90 tablet; Refill: 3 - CBC with Differential - Comprehensive Metabolic Panel (CMET)  3. Hypercholesterolemia without hypertriglyceridemia Stable. Had labs drawn through her husband's employer for biometric screen and was told her cholesterol was normal. Advised if she had a copy if she could bring [place] a copy to scan into her chart. She agrees.  Diagnosis pulled for medication refill. Continue current medical treatment plan. - lovastatin (MEVACOR) 40 MG tablet; Take 1 tablet (40 mg total) by mouth at bedtime.  Dispense: 90 tablet; Refill: 3  4. Other seasonal allergic rhinitis Stable. Diagnosis pulled for medication refill. Continue current medical treatment plan. - montelukast (SINGULAIR) 10 MG tablet; Take 1 tablet (10 mg total) by mouth daily.  Dispense: 90 tablet; Refill: 3  5. Abnormal LFTs Will check labs as below and f/u pending results. - Comprehensive Metabolic Panel (CMET)  6. Insomnia Stable. Diagnosis pulled for medication refill. Continue current medical treatment plan. - traZODone (DESYREL) 100 MG tablet; Take 1 tablet (100 mg total) by mouth at bedtime as needed for sleep.  Dispense: 90 tablet; Refill: 3 - zolpidem (AMBIEN) 10 MG tablet; Take 1 tablet (10 mg total) by mouth at bedtime as needed for sleep.  Dispense: 90 tablet; Refill: 1  7. Depression Stable. Diagnosis pulled for medication refill. Continue current medical treatment plan. Will check labs as below and f/u pending results. - venlafaxine XR  (EFFEXOR-XR) 75 MG 24 hr capsule; Take 3 capsules (225 mg total) by mouth daily.  Dispense: 270 capsule; Refill: 3 - TSH       Mar Daring, PA-C  [facility]

## 2015-12-16 ENCOUNTER — Telehealth: Payer: Self-pay | Admitting: Family Medicine

## 2015-12-16 DIAGNOSIS — M109 Gout, unspecified: Secondary | ICD-10-CM

## 2015-12-16 MED ORDER — COLCHICINE 0.6 MG PO TABS: ORAL_TABLET | ORAL | 0 refills | Status: DC

## 2015-12-16 NOTE — Telephone Encounter (Signed)
Please review. Does she need an appointment?

## 2015-12-16 NOTE — Telephone Encounter (Signed)
Patient advised as directed below. Voiced understanding.  Thanks,  -Joseline 

## 2015-12-16 NOTE — Telephone Encounter (Signed)
Pt has gout in left foot.  She would like something sent I nfor gout.  [facility]  Call back is [phone]  [facility]

## 2015-12-16 NOTE — Telephone Encounter (Signed)
Colchicine sent in to [facility]. She is to call if symptoms do not improve. May not repeat treatment for 3 days. She needs to hold lovastatin for 3 days as well.

## 2015-12-20 ENCOUNTER — Encounter: Payer: Self-pay | Admitting: Physician Assistant

## 2016-01-15 ENCOUNTER — Encounter: Payer: Self-pay | Admitting: Family Medicine

## 2016-01-15 ENCOUNTER — Ambulatory Visit (INDEPENDENT_AMBULATORY_CARE_PROVIDER_SITE_OTHER): Payer: Managed Care, Other (non HMO) | Admitting: Family Medicine

## 2016-01-15 VITALS — BP 100/76 | HR 78 | Temp 98.4°F | Resp 16 | Wt 183.0 lb

## 2016-01-15 DIAGNOSIS — M109 Gout, unspecified: Secondary | ICD-10-CM | POA: Diagnosis not present

## 2016-01-15 MED ORDER — PREDNISONE 20 MG PO TABS: ORAL_TABLET | ORAL | 1 refills | Status: DC

## 2016-01-15 NOTE — Progress Notes (Signed)
   Subjective:    Patient ID: Stephanie Ball, female    DOB: 06-14-1955, 60 y.o.   MRN: [id number]  HPI  Chief Complaint  Patient presents with  . Foot Pain    Patient comes into office with complaints of left foot pain for the past 2 days. Associated with pain patient reports redness and swelling.   States she is having a recurrence of gout in her foot. Has been taking Tylenol with little relief. Has hx of CKD so does not wish to use nsaid's. Denies injury or alcohol use. Currently on both HCTZ and ASA.    Review of Systems     Objective:   Physical Exam  Constitutional: She appears well-developed and well-nourished. She appears distressed (antalgic gait).  Cardiovascular:  Pulses:      Dorsalis pedis pulses are 2+ on the left side.       Posterior tibial pulses are 2+ on the left side.  Musculoskeletal:  Left foot DF/PF 5/5. Ankle ligaments stable.  Skin:  Dorsum and lateral aspect of left foot swollen with mild erythema. Moderate tenderness on palpation.           Assessment & Plan:  1. Acute gouty arthritis: hand out provided/ update labs when not flaring. - predniSONE (DELTASONE) 20 MG tablet; One pill twice daily with food.  Dispense: 14 tablet; Refill: 1 - Renal function panel - Uric acid

## 2016-01-15 NOTE — Patient Instructions (Addendum)
Please get labs after this has flared down.  Gout Gout is an inflammatory arthritis caused by a buildup of uric acid crystals in the joints. Uric acid is a chemical that is normally present in the blood. When the level of uric acid in the blood is too high it can form crystals that deposit in your joints and tissues. This causes joint redness, soreness, and swelling (inflammation). Repeat attacks are common. Over time, uric acid crystals can form into masses (tophi) near a joint, destroying bone and causing disfigurement. Gout is treatable and often preventable. CAUSES  The disease begins with elevated levels of uric acid in the blood. Uric acid is produced by your body when it breaks down a naturally found substance called purines. Certain foods you eat, such as meats and fish, contain high amounts of purines. Causes of an elevated uric acid level include:  Being passed down from parent to child (heredity).  Diseases that cause increased uric acid production (such as obesity, psoriasis, and certain cancers).  Excessive alcohol use.  Diet, especially diets rich in meat and seafood.  Medicines, including certain cancer-fighting medicines (chemotherapy), water pills (diuretics), and aspirin.  Chronic kidney disease. The kidneys are no longer able to remove uric acid well.  Problems with metabolism. Conditions strongly associated with gout include:  Obesity.  High blood pressure.  High cholesterol.  Diabetes. Not everyone with elevated uric acid levels gets gout. It is not understood why some people get gout and others do not. Surgery, joint injury, and eating too much of certain foods are some of the factors that can lead to gout attacks. SYMPTOMS   An attack of gout comes on quickly. It causes intense pain with redness, swelling, and warmth in a joint.  Fever can occur.  Often, only one joint is involved. Certain joints are more commonly involved:  Base of the big  toe.  Knee.  Ankle.  Wrist.  Finger. Without treatment, an attack usually goes away in a few days to weeks. Between attacks, you usually will not have symptoms, which is different from many other forms of arthritis. DIAGNOSIS  Your caregiver will suspect gout based on your symptoms and exam. In some cases, tests may be recommended. The tests may include:  Blood tests.  Urine tests.  X-rays.  Joint fluid exam. This exam requires a needle to remove fluid from the joint (arthrocentesis). Using a microscope, gout is confirmed when uric acid crystals are seen in the joint fluid. TREATMENT  There are two phases to gout treatment: treating the sudden onset (acute) attack and preventing attacks (prophylaxis).  Treatment of an Acute Attack.  Medicines are used. These include anti-inflammatory medicines or steroid medicines.  An injection of steroid medicine into the affected joint is sometimes necessary.  The painful joint is rested. Movement can worsen the arthritis.  You may use warm or cold treatments on painful joints, depending which works best for you.  Treatment to Prevent Attacks.  If you suffer from frequent gout attacks, your caregiver may advise preventive medicine. These medicines are started after the acute attack subsides. These medicines either help your kidneys eliminate uric acid from your body or decrease your uric acid production. You may need to stay on these medicines for a very long time.  The early phase of treatment with preventive medicine can be associated with an increase in acute gout attacks. For this reason, during the first few months of treatment, your caregiver may also advise you to take medicines  usually used for acute gout treatment. Be sure you understand your caregiver's directions. Your caregiver may make several adjustments to your medicine dose before these medicines are effective.  Discuss dietary treatment with your caregiver or dietitian.  Alcohol and drinks high in sugar and fructose and foods such as meat, poultry, and seafood can increase uric acid levels. Your caregiver or dietitian can advise you on drinks and foods that should be limited. HOME CARE INSTRUCTIONS   Do not take aspirin to relieve pain. This raises uric acid levels.  Only take over-the-counter or prescription medicines for pain, discomfort, or fever as directed by your caregiver.  Rest the joint as much as possible. When in bed, keep sheets and blankets off painful areas.  Keep the affected joint raised (elevated).  Apply warm or cold treatments to painful joints. Use of warm or cold treatments depends on which works best for you.  Use crutches if the painful joint is in your leg.  Drink enough fluids to keep your urine clear or pale yellow. This helps your body get rid of uric acid. Limit alcohol, sugary drinks, and fructose drinks.  Follow your dietary instructions. Pay careful attention to the amount of protein you eat. Your daily diet should emphasize fruits, vegetables, whole grains, and fat-free or low-fat milk products. Discuss the use of coffee, vitamin C, and cherries with your caregiver or dietitian. These may be helpful in lowering uric acid levels.  Maintain a healthy body weight. SEEK MEDICAL CARE IF:   You develop diarrhea, vomiting, or any side effects from medicines.  You do not feel better in 24 hours, or you are getting worse. SEEK IMMEDIATE MEDICAL CARE IF:   Your joint becomes suddenly more tender, and you have chills or a fever. MAKE SURE YOU:   Understand these instructions.  Will watch your condition.  Will get help right away if you are not doing well or get worse.   This information is not intended to replace advice given to you by your health care provider. Make sure you discuss any questions you have with your health care provider.   Document Released: 04/17/2000 Document Revised: 05/11/2014 Document Reviewed:  12/02/2011 Elsevier Interactive Patient Education [employer].

## 2016-01-22 ENCOUNTER — Other Ambulatory Visit: Payer: Self-pay | Admitting: Family Medicine

## 2016-01-22 DIAGNOSIS — M109 Gout, unspecified: Secondary | ICD-10-CM

## 2016-01-22 MED ORDER — ALLOPURINOL 300 MG PO TABS: 300.0000 mg | ORAL_TABLET | Freq: Every day | ORAL | 6 refills | Status: DC

## 2016-01-23 ENCOUNTER — Encounter: Payer: Self-pay | Admitting: Family Medicine

## 2016-01-27 ENCOUNTER — Telehealth: Payer: Self-pay | Admitting: Family Medicine

## 2016-01-27 NOTE — Telephone Encounter (Signed)
Please advise 

## 2016-01-27 NOTE — Telephone Encounter (Signed)
Stop the allopurinol for now but this sounds more like you developed a coincidental upper respiratory infection.  If symptoms persist after stopping allopurinol would suspect viral infection to be the case and would be happy to see you in the office.

## 2016-01-27 NOTE — Telephone Encounter (Signed)
[id number] 

## 2016-01-27 NOTE — Telephone Encounter (Signed)
Pt states she started taking the Rx allopurinol (ZYLOPRIM) 300 MG tablet on Friday and on Saturday morning pt started having side effects.  Pt states she  started having body aches, sore throat and feeling like it was hard to breath.  Pt is asking if she can get something different.  [facility].  [id number]

## 2016-01-27 NOTE — Telephone Encounter (Signed)
Advised patient of results.  

## 2016-02-10 ENCOUNTER — Ambulatory Visit (INDEPENDENT_AMBULATORY_CARE_PROVIDER_SITE_OTHER): Payer: Managed Care, Other (non HMO) | Admitting: Physician Assistant

## 2016-02-10 ENCOUNTER — Encounter: Payer: Self-pay | Admitting: Physician Assistant

## 2016-02-10 DIAGNOSIS — M109 Gout, unspecified: Secondary | ICD-10-CM | POA: Insufficient documentation

## 2016-02-10 MED ORDER — PREDNISONE 20 MG PO TABS: ORAL_TABLET | ORAL | 1 refills | Status: DC

## 2016-02-10 NOTE — Progress Notes (Signed)
Patient: Stephanie Ball Female    DOB: March 19, 1956   60 y.o.   MRN: [id number] Visit Date: 02/10/2016  Today's Provider: Mar Daring, PA-C   No chief complaint on file.  Subjective:    HPI Gout: Patient is here with c/o of gout flare up.  Left 1st MTP joint is red and warm, pain is a 10/10. Even light touch is almost unbearable. Patient is on Allopurinol 300mg . She also reports that she has tried extra strength Tylenol, ice, heat and elevation for the current episode. Most recent episode prior was on 01/15/16. She was treated with prednisone and reports complete relief. She does admit to having red meats recently and shrimp.    Allergies  Allergen Reactions  . Aciphex  [Rabeprazole Sodium]   . Amoxicillin-Pot Clavulanate   . Iron   . Macrobid  [employer] Macro]   . Metoprolol Other (See Comments)    After taking medication for about 72 hours she noted elevated systolic BP and swelling of hands and feet and discontinued.     Current Outpatient Prescriptions:  .  allopurinol (ZYLOPRIM) 300 MG tablet, Take 1 tablet (300 mg total) by mouth daily., Disp: 30 tablet, Rfl: 6 .  aspirin 81 MG tablet, Take 2 tablets by mouth at bedtime., Disp: , Rfl:  .  busPIRone (BUSPAR) 15 MG tablet, Take 1 tablet (15 mg total) by mouth 2 (two) times daily., Disp: 180 tablet, Rfl: 3 .  clotrimazole-betamethasone (LOTRISONE) cream, CLOTRIMAZOLE-BETAMETHASONE, 1-0.05% (External Cream)  1 Cream Apply twice a day to affected area for 0 days  Quantity: 45;  Refills: 2   Ordered :09-Aug-2012  Maurine Minister FNP;  Started 09-Aug-2012 Active, Disp: , Rfl:  .  hydrochlorothiazide (HYDRODIURIL) 12.5 MG tablet, Take 1 tablet (12.5 mg total) by mouth daily., Disp: 90 tablet, Rfl: 3 .  lisinopril (PRINIVIL,ZESTRIL) 40 MG tablet, Take 1 tablet (40 mg total) by mouth daily., Disp: 90 tablet, Rfl: 3 .  lovastatin (MEVACOR) 40 MG tablet, Take 1 tablet (40 mg total) by mouth at bedtime., Disp: 90  tablet, Rfl: 3 .  montelukast (SINGULAIR) 10 MG tablet, Take 1 tablet (10 mg total) by mouth daily., Disp: 90 tablet, Rfl: 3 .  OLANZapine (ZYPREXA) 15 MG tablet, Take 1 tablet (15 mg total) by mouth daily., Disp: 90 tablet, Rfl: 3 .  predniSONE (DELTASONE) 20 MG tablet, One pill twice daily with food., Disp: 14 tablet, Rfl: 1 .  traZODone (DESYREL) 100 MG tablet, Take 1 tablet (100 mg total) by mouth at bedtime as needed for sleep., Disp: 90 tablet, Rfl: 3 .  venlafaxine XR (EFFEXOR-XR) 75 MG 24 hr capsule, Take 3 capsules (225 mg total) by mouth daily., Disp: 270 capsule, Rfl: 3 .  zolpidem (AMBIEN) 10 MG tablet, Take 1 tablet (10 mg total) by mouth at bedtime as needed for sleep., Disp: 90 tablet, Rfl: 1  Review of Systems  Constitutional: Negative.   Respiratory: Negative.   Cardiovascular: Negative for chest pain, palpitations and leg swelling.       Left Toe is swollen and red  Gastrointestinal: Negative.   Musculoskeletal: Positive for arthralgias and joint swelling.  Skin: Positive for color change. Negative for pallor, rash and wound.  Neurological: Negative.     Social History  Substance Use Topics  . Smoking status: Current Every Day Smoker  . Smokeless tobacco: Never Used     Comment: would like to quit; as stated pt smoked 1/2 PPd for 25  years and now is smoking 5-6 cigarettes per day.  . Alcohol use No   Objective:   BP 120/80 (BP Location: Right Arm, Patient Position: Sitting, Cuff Size: Normal)   Pulse 98   Temp 98.7 F (37.1 C) (Oral)   Resp 16   Wt 183 lb 6.4 oz (83.2 kg)   Physical Exam  Constitutional: She appears well-developed and well-nourished. No distress.  Neck: Normal range of motion. Neck supple. No tracheal deviation present. No thyromegaly present.  Cardiovascular: Normal rate, regular rhythm and normal heart sounds.  Exam reveals no gallop and no friction rub.   No murmur heard. Pulmonary/Chest: Effort normal and breath sounds normal. No  respiratory distress. She has no wheezes. She has no rales.  Lymphadenopathy:    She has no cervical adenopathy.  Skin: She is not diaphoretic. There is erythema.     Vitals reviewed.      Assessment & Plan:     1. Acute gouty arthritis Worsening symptoms that has not responded to over-the-counter nonsteroidal anti-inflammatories. Will give prednisone as below. She is to continue allopurinol 300 mg. She is to call the office if symptoms do not improve. If symptoms do improve I will see her back at her earliest convenience for her annual physical exam with Pap smear. - predniSONE (DELTASONE) 20 MG tablet; One pill twice daily with food.  Dispense: 14 tablet; Refill: Gila, PA-C  [facility]

## 2016-02-10 NOTE — Patient Instructions (Signed)
Low-Purine Diet  Purines are compounds that affect the level of uric acid in your body. A low-purine diet is a diet that is low in purines. Eating a low-purine diet can prevent the level of uric acid in your body from getting too high and causing gout or kidney stones or both.  WHAT DO I NEED TO KNOW ABOUT THIS DIET?  · Choose low-purine foods. Examples of low-purine foods are listed in the next section.  · Drink plenty of fluids, especially water. Fluids can help remove uric acid from your body. Try to drink 8-16 cups (1.9-3.8 L) a day.  · Limit foods high in fat, especially saturated fat, as fat makes it harder for the body to get rid of uric acid. Foods high in saturated fat include pizza, cheese, ice cream, whole milk, fried foods, and gravies. Choose foods that are lower in fat and lean sources of protein. Use olive oil when cooking as it contains healthy fats that are not high in saturated fat.  · Limit alcohol. Alcohol interferes with the elimination of uric acid from your body. If you are having a gout attack, avoid all alcohol.  · Keep in mind that different people's bodies react differently to different foods. You will probably learn over time which foods do or do not affect you. If you discover that a food tends to cause your gout to flare up, avoid eating that food. You can more freely enjoy foods that do not cause problems. If you have any questions about a food item, talk to your dietitian or health care provider.  WHICH FOODS ARE LOW, MODERATE, AND HIGH IN PURINES?  The following is a list of foods that are low, moderate, and high in purines. You can eat any amount of the foods that are low in purines. You may be able to have small amounts of foods that are moderate in purines. Ask your health care provider how much of a food moderate in purines you can have. Avoid foods high in purines.  Grains  · Foods low in purines: Enriched white bread, pasta, rice, cake, cornbread, popcorn.  · Foods moderate in  purines: Whole-grain breads and cereals, wheat germ, bran, oatmeal. Uncooked oatmeal. Dry wheat bran or wheat germ.  · Foods high in purines: Pancakes, French toast, biscuits, muffins.  Vegetables  · Foods low in purines: All vegetables, except those that are moderate in purines.  · Foods moderate in purines: Asparagus, cauliflower, spinach, mushrooms, green peas.  Fruits  · All fruits are low in purines.  Meats and other Protein Foods  · Foods low in purines: Eggs, nuts, peanut butter.  · Foods moderate in purines: 80-90% lean beef, lamb, veal, pork, poultry, fish, eggs, peanut butter, nuts. Crab, lobster, oysters, and shrimp. Cooked dried beans, peas, and lentils.  · Foods high in purines: Anchovies, sardines, herring, mussels, tuna, codfish, scallops, trout, and haddock. Bacon. Organ meats (such as liver or kidney). Tripe. Game meat. Goose. Sweetbreads.  Dairy  · All dairy foods are low in purines. Low-fat and fat-free dairy products are best because they are low in saturated fat.  Beverages  · Drinks low in purines: Water, carbonated beverages, tea, coffee, cocoa.  · Drinks moderate in purines: Soft drinks and other drinks sweetened with high-fructose corn syrup. Juices. To find whether a food or drink is sweetened with high-fructose corn syrup, look at the ingredients list.  · Drinks high in purines: Alcoholic beverages (such as beer).  Condiments  · Foods   low in purines: Salt, herbs, olives, pickles, relishes, vinegar.  · Foods moderate in purines: Butter, margarine, oils, mayonnaise.  Fats and Oils  · Foods low in purines: All types, except gravies and sauces made with meat.  · Foods high in purines: Gravies and sauces made with meat.  Other Foods  · Foods low in purines: Sugars, sweets, gelatin. Cake. Soups made without meat.  · Foods moderate in purines: Meat-based or fish-based soups, broths, or bouillons. Foods and drinks sweetened with high-fructose corn syrup.  · Foods high in purines: High-fat desserts  (such as ice cream, cookies, cakes, pies, doughnuts, and chocolate).  Contact your dietitian for more information on foods that are not listed here.     This information is not intended to replace advice given to you by your health care provider. Make sure you discuss any questions you have with your health care provider.     Document Released: 08/15/2010 Document Revised: 04/25/2013 Document Reviewed: 03/27/2013  Elsevier Interactive Patient Education ©2016 Elsevier Inc[employer]

## 2016-02-24 ENCOUNTER — Other Ambulatory Visit: Payer: Self-pay | Admitting: Family Medicine

## 2016-02-24 DIAGNOSIS — M109 Gout, unspecified: Secondary | ICD-10-CM

## 2016-02-27 LAB — BASIC METABOLIC PANEL
BUN: 11 mg/dL (ref 4–21)
Creatinine: 1.4 mg/dL — AB (ref 0.5–1.1)
Glucose: 111 mg/dL
Potassium: 3.7 mmol/L (ref 3.4–5.3)
Sodium: 140 mmol/L (ref 137–147)

## 2016-02-27 LAB — LIPID PANEL: LDL/HDL RATIO: 2.9

## 2016-03-03 ENCOUNTER — Encounter: Payer: Self-pay | Admitting: Family Medicine

## 2016-03-13 ENCOUNTER — Ambulatory Visit (INDEPENDENT_AMBULATORY_CARE_PROVIDER_SITE_OTHER): Payer: Managed Care, Other (non HMO) | Admitting: Physician Assistant

## 2016-03-13 ENCOUNTER — Encounter: Payer: Self-pay | Admitting: Physician Assistant

## 2016-03-13 VITALS — BP 120/86 | HR 96 | Temp 98.1°F | Resp 16 | Ht 63.0 in | Wt 182.0 lb

## 2016-03-13 DIAGNOSIS — N261 Atrophy of kidney (terminal): Secondary | ICD-10-CM

## 2016-03-13 DIAGNOSIS — Z1231 Encounter for screening mammogram for malignant neoplasm of breast: Secondary | ICD-10-CM | POA: Diagnosis not present

## 2016-03-13 DIAGNOSIS — E7800 Pure hypercholesterolemia, unspecified: Secondary | ICD-10-CM | POA: Diagnosis not present

## 2016-03-13 DIAGNOSIS — R7989 Other specified abnormal findings of blood chemistry: Secondary | ICD-10-CM

## 2016-03-13 DIAGNOSIS — Z0000 Encounter for general adult medical examination without abnormal findings: Secondary | ICD-10-CM

## 2016-03-13 DIAGNOSIS — Z1239 Encounter for other screening for malignant neoplasm of breast: Secondary | ICD-10-CM

## 2016-03-13 DIAGNOSIS — Z131 Encounter for screening for diabetes mellitus: Secondary | ICD-10-CM | POA: Diagnosis not present

## 2016-03-13 DIAGNOSIS — I10 Essential (primary) hypertension: Secondary | ICD-10-CM

## 2016-03-13 DIAGNOSIS — Z1211 Encounter for screening for malignant neoplasm of colon: Secondary | ICD-10-CM

## 2016-03-13 DIAGNOSIS — Z1159 Encounter for screening for other viral diseases: Secondary | ICD-10-CM

## 2016-03-13 DIAGNOSIS — N182 Chronic kidney disease, stage 2 (mild): Secondary | ICD-10-CM

## 2016-03-13 DIAGNOSIS — R945 Abnormal results of liver function studies: Secondary | ICD-10-CM

## 2016-03-13 NOTE — Progress Notes (Signed)
Patient: Stephanie Ball, Female    DOB: 1956-03-02, 60 y.o.   MRN: [id number] Visit Date: 03/13/2016  Today's Provider: Mar Daring, PA-C   Chief Complaint  Patient presents with  . Annual Exam   Subjective:    Annual physical exam Stephanie Ball is a 60 y.o. female who presents today for health maintenance and complete physical. She feels well. She reports exercising no. She reports she is sleeping well.  01/29/12 CPE 07/23/10 Pap-neg 02/05/12 Mammogram-BI-RADS 2 -----------------------------------------------------------------   Review of Systems  Constitutional: Negative.   HENT: Negative.   Eyes: Negative.   Respiratory: Negative.   Cardiovascular: Negative.   Gastrointestinal: Negative.   Endocrine: Negative.   Genitourinary: Negative.   Musculoskeletal: Negative.   Skin: Negative.   Allergic/Immunologic: Negative.   Neurological: Negative.   Hematological: Negative.   Psychiatric/Behavioral: Negative.     Social History      She  reports that she has been smoking.  She has never used smokeless tobacco. She reports that she does not drink alcohol or use drugs.       Social History   Social History  . Marital status: Married    Spouse name: N/A  . Number of children: N/A  . Years of education: N/A   Social History Main Topics  . Smoking status: Current Every Day Smoker  . Smokeless tobacco: Never Used     Comment: would like to quit; as stated pt smoked 1/2 PPd for 25 years and now is smoking 5-6 cigarettes per day.  . Alcohol use No  . Drug use: No  . Sexual activity: Not Asked   Other Topics Concern  . None   Social History Narrative  . None    Past Medical History:  Diagnosis Date  . Bipolar disorder ([place]) 08/24/2008  . Chronic kidney disease   . Depressive disorder 08/24/2008  . Disorder of kidney and ureter 07/19/2009  . Fibrocystic breast disease   . Hematuria 08/24/2008  . Hydronephrosis of left kidney   .  Hypercalcemia 07/19/2009  . Hyperglycemia   . Hyperlipidemia 08/24/2008  . Hypertension 08/24/2008   essential, benign  . Hypokalemia   . Panic attack   . Pure hypercholesterolemia 09/28//2010     Patient Active Problem List   Diagnosis Date Noted  . Acute gouty arthritis 02/10/2016  . Insomnia 12/07/2014  . Anxiety 12/06/2014  . Allergic rhinitis 12/06/2014  . 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 Abnormal LFTs 12/06/2014  . Bloodgood disease 12/06/2014  . Hydronephrosis 12/06/2014  . Blood glucose elevated 12/06/2014  . Decreased potassium in the blood 12/06/2014  . Arterial blood pressure decreased 12/06/2014  . Panic attack 12/06/2014  . Breath shortness 12/06/2014  . Abnormal mammogram 12/06/2014  . Abdominal pain 12/06/2014  . Chronic kidney disease (CKD), stage II (mild) 08/29/2012  . History of metabolic disorder [id number]  . Kidney cysts 08/29/2012  . Urinary system disease 07/19/2009  . Calcium blood increased 07/19/2009  . Avitaminosis D 02/07/2009  . Hypercholesterolemia without hypertriglyceridemia 01/29/2009  . Compulsive tobacco user syndrome 09/21/2008  . Affective bipolar disorder ([place]) 08/24/2008  . Clinical depression 08/24/2008  . Endometriosis 08/24/2008  . Blood in the urine 08/24/2008  . Benign essential HTN 08/24/2008  . Syncope and collapse 08/24/2008    Past Surgical History:  Procedure Laterality Date  . ABDOMINAL HYSTERECTOMY     abdominal:due to fibroid and endometriosis. No history of abnormal paps. Ovaries removed also.  . CHOLECYSTECTOMY  no further information given  . KIDNEY SURGERY     as stated pt had kidney surgery with no further given  . OOPHORECTOMY Bilateral 1996   Dr, DeFrancisco  . TUBAL LIGATION      Family History        Family Status  Relation Status  . Mother Deceased   Cerebral aneurysm  . Father Deceased  . Maternal Grandmother Deceased   brain Aneurysm  . Paternal Grandmother Deceased  . Sister Alive  .  Daughter Alive  . Sister Alive        Her family history includes Anuerysm in her mother; Cancer in her paternal grandmother; Diabetes in her father.     Allergies  Allergen Reactions  . Aciphex  [Rabeprazole Sodium]   . Amoxicillin-Pot Clavulanate   . Iron   . Macrobid  [employer] Macro]   . Metoprolol Other (See Comments)    After taking medication for about 72 hours she noted elevated systolic BP and swelling of hands and feet and discontinued.     Current Outpatient Prescriptions:  .  allopurinol (ZYLOPRIM) 300 MG tablet, Take 1 tablet (300 mg total) by mouth daily., Disp: 30 tablet, Rfl: 6 .  aspirin 81 MG tablet, Take 2 tablets by mouth at bedtime., Disp: , Rfl:  .  busPIRone (BUSPAR) 15 MG tablet, Take 1 tablet (15 mg total) by mouth 2 (two) times daily., Disp: 180 tablet, Rfl: 3 .  clotrimazole-betamethasone (LOTRISONE) cream, CLOTRIMAZOLE-BETAMETHASONE, 1-0.05% (External Cream)  1 Cream Apply twice a day to affected area for 0 days  Quantity: 45;  Refills: 2   Ordered :09-Aug-2012  Maurine Minister FNP;  Started 09-Aug-2012 Active, Disp: , Rfl:  .  hydrochlorothiazide (HYDRODIURIL) 12.5 MG tablet, Take 1 tablet (12.5 mg total) by mouth daily., Disp: 90 tablet, Rfl: 3 .  lisinopril (PRINIVIL,ZESTRIL) 40 MG tablet, Take 1 tablet (40 mg total) by mouth daily., Disp: 90 tablet, Rfl: 3 .  lovastatin (MEVACOR) 40 MG tablet, Take 1 tablet (40 mg total) by mouth at bedtime., Disp: 90 tablet, Rfl: 3 .  montelukast (SINGULAIR) 10 MG tablet, Take 1 tablet (10 mg total) by mouth daily., Disp: 90 tablet, Rfl: 3 .  OLANZapine (ZYPREXA) 15 MG tablet, Take 1 tablet (15 mg total) by mouth daily., Disp: 90 tablet, Rfl: 3 .  predniSONE (DELTASONE) 20 MG tablet, One pill twice daily with food., Disp: 14 tablet, Rfl: 1 .  traZODone (DESYREL) 100 MG tablet, Take 1 tablet (100 mg total) by mouth at bedtime as needed for sleep., Disp: 90 tablet, Rfl: 3 .  venlafaxine XR (EFFEXOR-XR) 75 MG 24 hr  capsule, Take 3 capsules (225 mg total) by mouth daily., Disp: 270 capsule, Rfl: 3 .  zolpidem (AMBIEN) 10 MG tablet, Take 1 tablet (10 mg total) by mouth at bedtime as needed for sleep., Disp: 90 tablet, Rfl: 1   Patient Care Team: Jerrol Banana., MD as PCP - General (Family Medicine)      Objective:   Vitals: BP 120/86 (BP Location: Right Arm, Patient Position: Sitting, Cuff Size: Large)   Pulse 96   Temp 98.1 F (36.7 C) (Oral)   Resp 16   Ht 5\' 3"  (1.6 m)   Wt 182 lb (82.6 kg)   BMI 32.24 kg/m    Physical Exam  Constitutional: She is oriented to person, place, and time. She appears well-developed and well-nourished. No distress.  HENT:  Head: Normocephalic and atraumatic.  Right Ear: Tympanic membrane, external  ear and ear canal normal.  Left Ear: Tympanic membrane, external ear and ear canal normal.  Nose: Nose normal.  Mouth/Throat: Uvula is midline, oropharynx is clear and moist and mucous membranes are normal. No oropharyngeal exudate.  Eyes: Conjunctivae and EOM are normal. Pupils are equal, round, and reactive to light. Right eye exhibits no discharge. Left eye exhibits no discharge. No scleral icterus.  Neck: Normal range of motion. Neck supple. No JVD present. Carotid bruit is not present. No tracheal deviation present. No thyromegaly present.  Cardiovascular: Normal rate, regular rhythm, normal heart sounds and intact distal pulses.  Exam reveals no gallop and no friction rub.   No murmur heard. Pulmonary/Chest: Effort normal and breath sounds normal. No respiratory distress. She has no wheezes. She has no rales. She exhibits no tenderness. Right breast exhibits no inverted nipple, no mass, no nipple discharge, no skin change and no tenderness. Left breast exhibits no inverted nipple, no mass, no nipple discharge, no skin change and no tenderness. Breasts are symmetrical.  Abdominal: Soft. Bowel sounds are normal. She exhibits no distension and no mass. There is no  tenderness. There is no rebound and no guarding. Hernia confirmed negative in the right inguinal area and confirmed negative in the left inguinal area.  Genitourinary: Rectum normal and vagina normal. Rectal exam shows no external hemorrhoid, no mass, no tenderness and guaiac negative stool. No erythema, tenderness or bleeding in the vagina. No foreign body in the vagina. No signs of injury around the vagina. No vaginal discharge found.  Genitourinary Comments: Uterus and ovaries surgically absent  Musculoskeletal: Normal range of motion. She exhibits no edema or tenderness.  Lymphadenopathy:    She has no cervical adenopathy.       Right: No inguinal adenopathy present.       Left: No inguinal adenopathy present.  Neurological: She is alert and oriented to person, place, and time.  Skin: Skin is warm and dry. No rash noted. She is not diaphoretic.  Psychiatric: She has a normal mood and affect. Her behavior is normal. Judgment and thought content normal.  Vitals reviewed.    Depression Screen PHQ 2/9 Scores 03/13/2016  PHQ - 2 Score 0      Assessment & Plan:     Routine Health Maintenance and Physical Exam  Exercise Activities and Dietary recommendations Goals    None      Immunization History  Administered Date(s) Administered  . Influenza-Unspecified 03/01/2015  . Tdap 09/03/2005    Health Maintenance  Topic Date Due  . Hepatitis C Screening  08-Dec-1955  . HIV Screening  04/08/1972  . COLONOSCOPY  04/09/2007  . TETANUS/TDAP  09/04/2015  . INFLUENZA VACCINE  12/03/2015  . MAMMOGRAM  05/04/2016  . PAP SMEAR  05/04/2017     Discussed health benefits of physical activity, and encouraged her to engage in regular exercise appropriate for her age and condition.    1. Annual physical exam Normal physical exam today. Will check labs as below and f/u pending lab results. If labs are stable and WNL she will not need to have these rechecked for one year at her next annual  physical exam. She is to call the office in the meantime if she has any acute issue, questions or concerns. - CBC with Differential/Platelet - Comprehensive metabolic panel - TSH  2. Breast cancer screening Breast exam today was normal. There is no family history of breast cancer. She does perform regular self breast exams. Mammogram was ordered as  below. Information for [facility] was given to patient so she may schedule her mammogram at her convenience. - Mammogram Digital Screening; Future  3. Colon cancer screening OC Lite negative.  - IFOBT (OCCULT BLOOD)  4. Benign essential HTN Stable.  5. Atrophic kidney Will check labs as below and f/u pending results. - Comprehensive metabolic panel  6. Chronic kidney disease (CKD), stage II (mild) Will check labs as below and f/u pending results. - Comprehensive metabolic panel  7. Abnormal LFTs Will check labs as below and f/u pending results. - Comprehensive metabolic panel  8. Hypercholesterolemia without hypertriglyceridemia Will check labs as below and f/u pending results. - Lipid panel  9. Screening for diabetes mellitus Will check labs as below and f/u pending results. - Hemoglobin A1c  10. Need for hepatitis C screening test - Hepatitis C antibody screen  --------------------------------------------------------------------    Mar Daring, PA-C  [facility]

## 2016-03-13 NOTE — Patient Instructions (Signed)
Health Maintenance, Female Adopting a healthy lifestyle and getting preventive care can go a long way to promote health and wellness. Talk with your health care provider about what schedule of regular examinations is right for you. This is a good chance for you to check in with your provider about disease prevention and staying healthy. In between checkups, there are plenty of things you can do on your own. Experts have done a lot of research about which lifestyle changes and preventive measures are most likely to keep you healthy. Ask your health care provider for more information. WEIGHT AND DIET  Eat a healthy diet  Be sure to include plenty of vegetables, fruits, low-fat dairy products, and lean protein.  Do not eat a lot of foods high in solid fats, added sugars, or salt.  Get regular exercise. This is one of the most important things you can do for your health.  Most adults should exercise for at least 150 minutes each week. The exercise should increase your heart rate and make you sweat (moderate-intensity exercise).  Most adults should also do strengthening exercises at least twice a week. This is in addition to the moderate-intensity exercise.  Maintain a healthy weight  Body mass index (BMI) is a measurement that can be used to identify possible weight problems. It estimates body fat based on height and weight. Your health care provider can help determine your BMI and help you achieve or maintain a healthy weight.  For females 59 years of age and older:   A BMI below 18.5 is considered underweight.  A BMI of 18.5 to 24.9 is normal.  A BMI of 25 to 29.9 is considered overweight.  A BMI of 30 and above is considered obese.  Watch levels of cholesterol and blood lipids  You should start having your blood tested for lipids and cholesterol at 59 years of age, then have this test every 5 years.  You may need to have your cholesterol levels checked more often if:  Your lipid  or cholesterol levels are high.  You are older than 59 years of age.  You are at high risk for heart disease.  CANCER SCREENING   Lung Cancer  Lung cancer screening is recommended for adults 59-66 years old who are at high risk for lung cancer because of a history of smoking.  A yearly low-dose CT scan of the lungs is recommended for people who:  Currently smoke.  Have quit within the past 15 years.  Have at least a 30-pack-year history of smoking. A pack year is smoking an average of one pack of cigarettes a day for 1 year.  Yearly screening should continue until it has been 15 years since you quit.  Yearly screening should stop if you develop a health problem that would prevent you from having lung cancer treatment.  Breast Cancer  Practice breast self-awareness. This means understanding how your breasts normally appear and feel.  It also means doing regular breast self-exams. Let your health care provider know about any changes, no matter how small.  If you are in your 20s or 30s, you should have a clinical breast exam (CBE) by a health care provider every 1-3 years as part of a regular health exam.  If you are 25 or older, have a CBE every year. Also consider having a breast X-ray (mammogram) every year.  If you have a family history of breast cancer, talk to your health care provider about genetic screening.  If you  are at high risk for breast cancer, talk to your health care provider about having an MRI and a mammogram every year.  Breast cancer gene (BRCA) assessment is recommended for women who have family members with BRCA-related cancers. BRCA-related cancers include:  Breast.  Ovarian.  Tubal.  Peritoneal cancers.  Results of the assessment will determine the need for genetic counseling and BRCA1 and BRCA2 testing. Cervical Cancer Your health care provider may recommend that you be screened regularly for cancer of the pelvic organs (ovaries, uterus, and  vagina). This screening involves a pelvic examination, including checking for microscopic changes to the surface of your cervix (Pap test). You may be encouraged to have this screening done every 3 years, beginning at age 59.  For women ages 30-65, health care providers may recommend pelvic exams and Pap testing every 3 years, or they may recommend the Pap and pelvic exam, combined with testing for human papilloma virus (HPV), every 5 years. Some types of HPV increase your risk of cervical cancer. Testing for HPV may also be done on women of any age with unclear Pap test results.  Other health care providers may not recommend any screening for nonpregnant women who are considered low risk for pelvic cancer and who do not have symptoms. Ask your health care provider if a screening pelvic exam is right for you.  If you have had past treatment for cervical cancer or a condition that could lead to cancer, you need Pap tests and screening for cancer for at least 20 years after your treatment. If Pap tests have been discontinued, your risk factors (such as having a new sexual partner) need to be reassessed to determine if screening should resume. Some women have medical problems that increase the chance of getting cervical cancer. In these cases, your health care provider may recommend more frequent screening and Pap tests. Colorectal Cancer  This type of cancer can be detected and often prevented.  Routine colorectal cancer screening usually begins at 59 years of age and continues through 59 years of age.  Your health care provider may recommend screening at an earlier age if you have risk factors for colon cancer.  Your health care provider may also recommend using home test kits to check for hidden blood in the stool.  A small camera at the end of a tube can be used to examine your colon directly (sigmoidoscopy or colonoscopy). This is done to check for the earliest forms of colorectal  cancer.  Routine screening usually begins at age 50.  Direct examination of the colon should be repeated every 5-10 years through 59 years of age. However, you may need to be screened more often if early forms of precancerous polyps or small growths are found. Skin Cancer  Check your skin from head to toe regularly.  Tell your health care provider about any new moles or changes in moles, especially if there is a change in a mole's shape or color.  Also tell your health care provider if you have a mole that is larger than the size of a pencil eraser.  Always use sunscreen. Apply sunscreen liberally and repeatedly throughout the day.  Protect yourself by wearing long sleeves, pants, a wide-brimmed hat, and sunglasses whenever you are outside. HEART DISEASE, DIABETES, AND HIGH BLOOD PRESSURE   High blood pressure causes heart disease and increases the risk of stroke. High blood pressure is more likely to develop in:  People who have blood pressure in the high end   of the normal range (130-139/85-89 mm Hg).  People who are overweight or obese.  People who are African American.  If you are 38-23 years of age, have your blood pressure checked every 3-5 years. If you are 61 years of age or older, have your blood pressure checked every year. You should have your blood pressure measured twice--once when you are at a hospital or clinic, and once when you are not at a hospital or clinic. Record the average of the two measurements. To check your blood pressure when you are not at a hospital or clinic, you can use:  An automated blood pressure machine at a pharmacy.  A home blood pressure monitor.  If you are between 45 years and 39 years old, ask your health care provider if you should take aspirin to prevent strokes.  Have regular diabetes screenings. This involves taking a blood sample to check your fasting blood sugar level.  If you are at a normal weight and have a low risk for diabetes,  have this test once every three years after 59 years of age.  If you are overweight and have a high risk for diabetes, consider being tested at a younger age or more often. PREVENTING INFECTION  Hepatitis B  If you have a higher risk for hepatitis B, you should be screened for this virus. You are considered at high risk for hepatitis B if:  You were born in a country where hepatitis B is common. Ask your health care provider which countries are considered high risk.  Your parents were born in a high-risk country, and you have not been immunized against hepatitis B (hepatitis B vaccine).  You have HIV or AIDS.  You use needles to inject street drugs.  You live with someone who has hepatitis B.  You have had sex with someone who has hepatitis B.  You get hemodialysis treatment.  You take certain medicines for conditions, including cancer, organ transplantation, and autoimmune conditions. Hepatitis C  Blood testing is recommended for:  Everyone born from [birth year] through 1964.  Anyone with known risk factors for hepatitis C. Sexually transmitted infections (STIs)  You should be screened for sexually transmitted infections (STIs) including gonorrhea and chlamydia if:  You are sexually active and are younger than 59 years of age.  You are older than 59 years of age and your health care provider tells you that you are at risk for this type of infection.  Your sexual activity has changed since you were last screened and you are at an increased risk for chlamydia or gonorrhea. Ask your health care provider if you are at risk.  If you do not have HIV, but are at risk, it may be recommended that you take a prescription medicine daily to prevent HIV infection. This is called pre-exposure prophylaxis (PrEP). You are considered at risk if:  You are sexually active and do not regularly use condoms or know the HIV status of your partner(s).  You take drugs by injection.  You are sexually  active with a partner who has HIV. Talk with your health care provider about whether you are at high risk of being infected with HIV. If you choose to begin PrEP, you should first be tested for HIV. You should then be tested every 3 months for as long as you are taking PrEP.  PREGNANCY   If you are premenopausal and you may become pregnant, ask your health care provider about preconception counseling.  If you may  become pregnant, take 400 to 800 micrograms (mcg) of folic acid every day.  If you want to prevent pregnancy, talk to your health care provider about birth control (contraception). OSTEOPOROSIS AND MENOPAUSE   Osteoporosis is a disease in which the bones lose minerals and strength with aging. This can result in serious bone fractures. Your risk for osteoporosis can be identified using a bone density scan.  If you are 61 years of age or older, or if you are at risk for osteoporosis and fractures, ask your health care provider if you should be screened.  Ask your health care provider whether you should take a calcium or vitamin D supplement to lower your risk for osteoporosis.  Menopause may have certain physical symptoms and risks.  Hormone replacement therapy may reduce some of these symptoms and risks. Talk to your health care provider about whether hormone replacement therapy is right for you.  HOME CARE INSTRUCTIONS   Schedule regular health, dental, and eye exams.  Stay current with your immunizations.   Do not use any tobacco products including cigarettes, chewing tobacco, or electronic cigarettes.  If you are pregnant, do not drink alcohol.  If you are breastfeeding, limit how much and how often you drink alcohol.  Limit alcohol intake to no more than 1 drink per day for nonpregnant women. One drink equals 12 ounces of beer, 5 ounces of wine, or 1 ounces of hard liquor.  Do not use street drugs.  Do not share needles.  Ask your health care provider for help if  you need support or information about quitting drugs.  Tell your health care provider if you often feel depressed.  Tell your health care provider if you have ever been abused or do not feel safe at home.   This information is not intended to replace advice given to you by your health care provider. Make sure you discuss any questions you have with your health care provider.   Document Released: 11/03/2010 Document Revised: 05/11/2014 Document Reviewed: 03/22/2013 Elsevier Interactive Patient Education [employer].

## 2016-03-16 ENCOUNTER — Other Ambulatory Visit: Payer: Self-pay | Admitting: Physician Assistant

## 2016-03-17 LAB — LIPID PANEL WITH LDL/HDL RATIO
CHOLESTEROL TOTAL: 192 mg/dL (ref 100–199)
HDL: 47 mg/dL (ref >39)
LDL Calculated: 94 mg/dL (ref 0–99)
LDl/HDL Ratio: 2 ratio units (ref 0.0–3.2)
TRIGLYCERIDES: 254 mg/dL — AB (ref 0–149)
VLDL CHOLESTEROL CAL: 51 mg/dL — AB (ref 5–40)

## 2016-03-17 LAB — CBC WITH DIFFERENTIAL/PLATELET
BASOS ABS: 0 10*3/uL (ref 0.0–0.2)
Basos: 0 %
EOS (ABSOLUTE): 0.3 10*3/uL (ref 0.0–0.4)
Eos: 3 %
Hematocrit: 37.6 % (ref 34.0–46.6)
Hemoglobin: 12.9 g/dL (ref 11.1–15.9)
IMMATURE GRANS (ABS): 0.1 10*3/uL (ref 0.0–0.1)
Immature Granulocytes: 1 %
LYMPHS: 31 %
Lymphocytes Absolute: 2.9 10*3/uL (ref 0.7–3.1)
MCH: 29.9 pg (ref 26.6–33.0)
MCHC: 34.3 g/dL (ref 31.5–35.7)
MCV: 87 fL (ref 79–97)
MONOS ABS: 0.5 10*3/uL (ref 0.1–0.9)
Monocytes: 5 %
NEUTROS ABS: 5.5 10*3/uL (ref 1.4–7.0)
NEUTROS PCT: 60 %
PLATELETS: 317 10*3/uL (ref 150–379)
RBC: 4.32 x10E6/uL (ref 3.77–5.28)
RDW: 15.1 % (ref 12.3–15.4)
WBC: 9.3 10*3/uL (ref 3.4–10.8)

## 2016-03-17 LAB — IFOBT (OCCULT BLOOD): IMMUNOLOGICAL FECAL OCCULT BLOOD TEST: NEGATIVE

## 2016-03-17 LAB — COMPREHENSIVE METABOLIC PANEL
ALK PHOS: 91 IU/L (ref 39–117)
ALT: 14 IU/L (ref 0–32)
AST: 15 IU/L (ref 0–40)
Albumin/Globulin Ratio: 1.6 (ref 1.2–2.2)
Albumin: 4.1 g/dL (ref 3.5–5.5)
BILIRUBIN TOTAL: 0.4 mg/dL (ref 0.0–1.2)
BUN/Creatinine Ratio: 10 (ref 9–23)
BUN: 12 mg/dL (ref 6–24)
CHLORIDE: 100 mmol/L (ref 96–106)
CO2: 27 mmol/L (ref 18–29)
Calcium: 10.6 mg/dL — ABNORMAL HIGH (ref 8.7–10.2)
Creatinine, Ser: 1.18 mg/dL — ABNORMAL HIGH (ref 0.57–1.00)
GFR calc non Af Amer: 51 mL/min/{1.73_m2} — ABNORMAL LOW (ref >59)
GFR, EST AFRICAN AMERICAN: 59 mL/min/{1.73_m2} — AB (ref >59)
GLUCOSE: 106 mg/dL — AB (ref 65–99)
Globulin, Total: 2.5 g/dL (ref 1.5–4.5)
POTASSIUM: 3.7 mmol/L (ref 3.5–5.2)
Sodium: 142 mmol/L (ref 134–144)
TOTAL PROTEIN: 6.6 g/dL (ref 6.0–8.5)

## 2016-03-17 LAB — HGB A1C W/O EAG: Hgb A1c MFr Bld: 5.5 % (ref 4.8–5.6)

## 2016-03-17 LAB — TSH: TSH: 1.72 u[IU]/mL (ref 0.450–4.500)

## 2016-03-18 ENCOUNTER — Telehealth: Payer: Self-pay

## 2016-03-18 NOTE — Telephone Encounter (Signed)
-----   Message from Mar Daring, [place] sent at 03/18/2016  1:14 PM EST ----- All labs are within normal limits and stable.  Triglycerides elevated. This is most closely related to diet habits. Try to avoid foods with high fat content. Thanks! -JB

## 2016-03-18 NOTE — Telephone Encounter (Signed)
Patient advised as below. Patient verbalizes understanding and is in agreement with treatment plan.  

## 2016-03-20 ENCOUNTER — Other Ambulatory Visit: Payer: Self-pay

## 2016-03-20 DIAGNOSIS — M109 Gout, unspecified: Secondary | ICD-10-CM

## 2016-03-20 MED ORDER — ALLOPURINOL 300 MG PO TABS: 300.0000 mg | ORAL_TABLET | Freq: Every day | ORAL | 1 refills | Status: DC

## 2016-03-20 NOTE — Telephone Encounter (Signed)
Refill request from [facility] for Allopurinol but to get this changed to 90 day supply instead-aa

## 2016-03-23 ENCOUNTER — Encounter: Payer: Self-pay | Admitting: Family Medicine

## 2016-03-30 ENCOUNTER — Telehealth: Payer: Self-pay | Admitting: Family Medicine

## 2016-03-30 DIAGNOSIS — R928 Other abnormal and inconclusive findings on diagnostic imaging of breast: Secondary | ICD-10-CM

## 2016-03-30 NOTE — Telephone Encounter (Signed)
NA

## 2016-03-30 NOTE — Telephone Encounter (Signed)
Pt had her mammogram at [place]. They told her she needed addition imaging studies.  She needs to schedule them at [facility].  She needs a referral   Pt's call back is [phone]  Thanks Con Memos

## 2016-03-30 NOTE — Telephone Encounter (Signed)
Diagnostic mammogram and [place] ordered for both breasts.

## 2016-04-01 ENCOUNTER — Inpatient Hospital Stay
Admission: RE | Admit: 2016-04-01 | Discharge: 2016-04-01 | Disposition: A | Discharge status: Home / Self Care | Payer: Self-pay | Source: Ambulatory Visit | Attending: *Deleted | Admitting: *Deleted

## 2016-04-01 ENCOUNTER — Other Ambulatory Visit: Payer: Self-pay | Admitting: *Deleted

## 2016-04-01 DIAGNOSIS — Z1231 Encounter for screening mammogram for malignant neoplasm of breast: Secondary | ICD-10-CM

## 2016-04-09 ENCOUNTER — Encounter: Payer: Self-pay | Admitting: Physician Assistant

## 2016-04-09 ENCOUNTER — Encounter: Payer: Self-pay | Admitting: Family Medicine

## 2016-04-21 ENCOUNTER — Telehealth: Payer: Self-pay | Admitting: Family Medicine

## 2016-04-21 DIAGNOSIS — N3000 Acute cystitis without hematuria: Secondary | ICD-10-CM

## 2016-04-21 MED ORDER — CIPROFLOXACIN HCL 250 MG PO TABS: 250.0000 mg | ORAL_TABLET | Freq: Two times a day (BID) | ORAL | 0 refills | Status: DC

## 2016-04-21 NOTE — Telephone Encounter (Signed)
Pt advised. Emily Drozdowski, CMA  

## 2016-04-21 NOTE — Telephone Encounter (Signed)
I have sent in cipro 250mg  for her to take BID x 3 days. She is to call if symptoms persist.

## 2016-04-21 NOTE — Telephone Encounter (Signed)
Pt states she is having pain when voiding and frequency.  Pt is requesting a Rx to help with this without coming in for an appointment.  [facility].  [id number]

## 2016-04-21 NOTE — Telephone Encounter (Signed)
Please advise. Emily Drozdowski, CMA  

## 2016-06-10 ENCOUNTER — Other Ambulatory Visit: Payer: Self-pay | Admitting: Physician Assistant

## 2016-06-10 DIAGNOSIS — F3170 Bipolar disorder, currently in remission, most recent episode unspecified: Secondary | ICD-10-CM

## 2016-06-10 MED ORDER — VENLAFAXINE HCL ER 225 MG PO TB24: 225.0000 mg | ORAL_TABLET | Freq: Every day | ORAL | 3 refills | Status: DC

## 2016-06-10 NOTE — Progress Notes (Signed)
Insurance denied venlafaxine 75mg  TID, thus updated to 225mg  ER once daily

## 2016-06-12 ENCOUNTER — Ambulatory Visit (INDEPENDENT_AMBULATORY_CARE_PROVIDER_SITE_OTHER): Payer: Managed Care, Other (non HMO) | Admitting: Physician Assistant

## 2016-06-12 ENCOUNTER — Telehealth: Payer: Self-pay | Admitting: Family Medicine

## 2016-06-12 ENCOUNTER — Encounter: Payer: Self-pay | Admitting: Physician Assistant

## 2016-06-12 VITALS — BP 132/82 | HR 94 | Temp 98.1°F | Resp 16 | Wt 180.0 lb

## 2016-06-12 DIAGNOSIS — J209 Acute bronchitis, unspecified: Secondary | ICD-10-CM

## 2016-06-12 DIAGNOSIS — I10 Essential (primary) hypertension: Secondary | ICD-10-CM | POA: Diagnosis not present

## 2016-06-12 DIAGNOSIS — F5101 Primary insomnia: Secondary | ICD-10-CM | POA: Diagnosis not present

## 2016-06-12 DIAGNOSIS — F3170 Bipolar disorder, currently in remission, most recent episode unspecified: Secondary | ICD-10-CM

## 2016-06-12 MED ORDER — AZITHROMYCIN 250 MG PO TABS: ORAL_TABLET | ORAL | 0 refills | Status: DC

## 2016-06-12 MED ORDER — CLOTRIMAZOLE-BETAMETHASONE 1-0.05 % EX CREA: TOPICAL_CREAM | Freq: Two times a day (BID) | CUTANEOUS | 2 refills | Status: DC

## 2016-06-12 MED ORDER — BENZONATATE 200 MG PO CAPS: 200.0000 mg | ORAL_CAPSULE | Freq: Two times a day (BID) | ORAL | 0 refills | Status: DC | PRN

## 2016-06-12 MED ORDER — PREDNISONE 10 MG (21) PO TBPK: ORAL_TABLET | ORAL | 0 refills | Status: DC

## 2016-06-12 MED ORDER — ZOLPIDEM TARTRATE 10 MG PO TABS: 10.0000 mg | ORAL_TABLET | Freq: Every evening | ORAL | 1 refills | Status: DC | PRN

## 2016-06-12 NOTE — Telephone Encounter (Signed)
Pt husband called stating insurance will not cover the Rx Venlafaxine HCl 225 MG TB24.  Pt is requesting a different Rx.  Pt husband states he did call the insurance company but they did not say what would be covered.  [facility].  CB#[phone]/MW

## 2016-06-12 NOTE — Progress Notes (Signed)
Patient: Stephanie Ball Female    DOB: [birth year]   61 y.o.   MRN: [id number] Visit Date: 06/12/2016  Today's Provider: Mar Daring, PA-C   Chief Complaint  Patient presents with  . Follow-up   Subjective:    HPI   Follow up for insomnia  The patient was last seen for this 6 months ago. Changes made at last visit include no changes.  She reports excellent compliance with treatment. She feels that condition is Improved. She is not having side effects.   ------------------------------------------------------------------------------------   Hypertension, follow-up:  BP Readings from Last 3 Encounters:  06/12/16 132/82  03/13/16 120/86  02/10/16 120/80    She was last seen for hypertension 3 months ago.  BP at that visit was 120/86. Management changes since that visit include no changes. She reports excellent compliance with treatment. She is not having side effects.  She is not exercising. She is adherent to low salt diet.   Outside blood pressures are not being checked. She is experiencing chest pressure/discomfort.  Patient denies lower extremity edema.   Cardiovascular risk factors include advanced age (older than 63 for men, 10 for women), diabetes mellitus, hypertension, obesity (BMI >= 30 kg/m2) and smoking/ tobacco exposure.  Use of agents associated with hypertension: decongestants.     Weight trend: stable Wt Readings from Last 3 Encounters:  06/12/16 180 lb (81.6 kg)  03/13/16 182 lb (82.6 kg)  02/10/16 183 lb 6.4 oz (83.2 kg)   Current diet: in general, a "healthy" diet    ------------------------------------------------------------------------  Upper Respiratory Infection: Patient complains of symptoms of a URI. Symptoms include congestion and cough. Onset of symptoms was 5 days ago, gradually worsening since that time. She also c/o productive cough with  yellow colored sputum and wheezing for the past 2 days .  She is drinking  plenty of fluids. Evaluation to date: none. Treatment to date: cough suppressants and decongestants.      Allergies  Allergen Reactions  . Aciphex  [Rabeprazole Sodium]   . Amoxicillin-Pot Clavulanate   . Iron   . Macrobid  [employer] Macro]   . Metoprolol Other (See Comments)    After taking medication for about 72 hours she noted elevated systolic BP and swelling of hands and feet and discontinued.     Current Outpatient Prescriptions:  .  allopurinol (ZYLOPRIM) 300 MG tablet, Take 1 tablet (300 mg total) by mouth daily., Disp: 90 tablet, Rfl: 1 .  aspirin 81 MG tablet, Take 2 tablets by mouth at bedtime., Disp: , Rfl:  .  busPIRone (BUSPAR) 15 MG tablet, Take 1 tablet (15 mg total) by mouth 2 (two) times daily., Disp: 180 tablet, Rfl: 3 .  clotrimazole-betamethasone (LOTRISONE) cream, CLOTRIMAZOLE-BETAMETHASONE, 1-0.05% (External Cream)  1 Cream Apply twice a day to affected area for 0 days  Quantity: 45;  Refills: 2   Ordered :09-Aug-2012  Maurine Minister FNP;  Started 09-Aug-2012 Active, Disp: , Rfl:  .  hydrochlorothiazide (HYDRODIURIL) 12.5 MG tablet, Take 1 tablet (12.5 mg total) by mouth daily., Disp: 90 tablet, Rfl: 3 .  lisinopril (PRINIVIL,ZESTRIL) 40 MG tablet, Take 1 tablet (40 mg total) by mouth daily., Disp: 90 tablet, Rfl: 3 .  lovastatin (MEVACOR) 40 MG tablet, Take 1 tablet (40 mg total) by mouth at bedtime., Disp: 90 tablet, Rfl: 3 .  montelukast (SINGULAIR) 10 MG tablet, Take 1 tablet (10 mg total) by mouth daily., Disp: 90 tablet, Rfl: 3 .  OLANZapine (ZYPREXA)  15 MG tablet, Take 1 tablet (15 mg total) by mouth daily., Disp: 90 tablet, Rfl: 3 .  traZODone (DESYREL) 100 MG tablet, Take 1 tablet (100 mg total) by mouth at bedtime as needed for sleep., Disp: 90 tablet, Rfl: 3 .  Venlafaxine HCl 225 MG TB24, Take 1 tablet (225 mg total) by mouth daily., Disp: 90 each, Rfl: 3 .  zolpidem (AMBIEN) 10 MG tablet, Take 1 tablet (10 mg total) by mouth at bedtime as  needed for sleep., Disp: 90 tablet, Rfl: 1  Review of Systems  Constitutional: Negative.   HENT: Positive for congestion, ear pain, postnasal drip and sore throat.   Respiratory: Positive for cough, chest tightness and wheezing.   Cardiovascular: Negative.   Gastrointestinal: Negative.   Endocrine: Negative.   Neurological: Positive for headaches. Negative for dizziness.  Psychiatric/Behavioral: Negative.     Social History  Substance Use Topics  . Smoking status: Current Every Day Smoker  . Smokeless tobacco: Never Used     Comment: would like to quit; as stated pt smoked 1/2 PPd for 25 years and now is smoking 5-6 cigarettes per day.  . Alcohol use No   Objective:   BP 132/82 (BP Location: Left Arm, Patient Position: Sitting, Cuff Size: Large)   Pulse 94   Temp 98.1 F (36.7 C) (Oral)   Resp 16   Wt 180 lb (81.6 kg)   SpO2 95%   BMI 31.89 kg/m   Physical Exam  Constitutional: She appears well-developed and well-nourished. No distress.  HENT:  Head: Normocephalic and atraumatic.  Right Ear: Hearing, tympanic membrane, external ear and ear canal normal.  Left Ear: Hearing, tympanic membrane, external ear and ear canal normal.  Nose: Mucosal edema and rhinorrhea present. Right sinus exhibits no maxillary sinus tenderness and no frontal sinus tenderness. Left sinus exhibits no maxillary sinus tenderness and no frontal sinus tenderness.  Mouth/Throat: Uvula is midline, oropharynx is clear and moist and mucous membranes are normal. No oropharyngeal exudate, posterior oropharyngeal edema or posterior oropharyngeal erythema.  Eyes: Conjunctivae are normal. Pupils are equal, round, and reactive to light. Right eye exhibits no discharge. Left eye exhibits no discharge. No scleral icterus.  Neck: Normal range of motion. Neck supple. No tracheal deviation present. No thyromegaly present.  Cardiovascular: Normal rate, regular rhythm and normal heart sounds.  Exam reveals no gallop and  no friction rub.   No murmur heard. Pulmonary/Chest: Effort normal. No stridor. No respiratory distress. She has no wheezes. She has no rales.  Lymphadenopathy:    She has no cervical adenopathy.  Skin: Skin is warm and dry. She is not diaphoretic.  Psychiatric: She has a normal mood and affect. Her behavior is normal. Judgment and thought content normal.  Vitals reviewed.     Assessment & Plan:     1. Acute bronchitis, unspecified organism Improving. Continue current OTC medications as they are helping. Continue singulair. Delsym or Mucinex DM for cough.  2. Primary insomnia Stable. Diagnosis pulled for medication refill. Continue current medical treatment plan. - zolpidem (AMBIEN) 10 MG tablet; Take 1 tablet (10 mg total) by mouth at bedtime as needed for sleep.  Dispense: 90 tablet; Refill: 1  3. Benign essential HTN Stable. Continue lisinopril 40mg , HCTZ 12.5 mg.       Mar Daring, PA-C  [facility]

## 2016-06-12 NOTE — Telephone Encounter (Signed)
She is on Buspar. I do not see any other medications for similar issue except xanax.-aa

## 2016-06-12 NOTE — Patient Instructions (Signed)

## 2016-06-12 NOTE — Telephone Encounter (Signed)
Please review. Stephanie Ball, CMA  

## 2016-06-17 MED ORDER — VENLAFAXINE HCL 75 MG PO TABS: 75.0000 mg | ORAL_TABLET | Freq: Three times a day (TID) | ORAL | 3 refills | Status: DC

## 2016-06-17 NOTE — Telephone Encounter (Signed)
Maybe 75mg  3 daily might be covered?

## 2016-06-17 NOTE — Telephone Encounter (Signed)
Sent in 75mg  TID to see if covered better. I do recommend if it is still not covered have pharmacy start PA for [place] to do.

## 2016-06-17 NOTE — Telephone Encounter (Signed)
Patient's husband reports that they wont cover 75mg  TID either. He reports that he has a list from the insurance co of the medications they will cover. Appt was scheduled (on 2/16) so that you and the patient could review and find out which medication is best.

## 2016-06-17 NOTE — Telephone Encounter (Signed)
Ok I was thinking we had discussed in the room that the 75mg  wasn't covered, but couldn't remember. I will see them Friday and apologize for them having to come back in for this.

## 2016-06-19 ENCOUNTER — Encounter: Payer: Self-pay | Admitting: Physician Assistant

## 2016-06-19 ENCOUNTER — Ambulatory Visit (INDEPENDENT_AMBULATORY_CARE_PROVIDER_SITE_OTHER): Payer: Managed Care, Other (non HMO) | Admitting: Physician Assistant

## 2016-06-19 VITALS — BP 110/80 | HR 112 | Temp 98.1°F | Resp 16 | Wt 180.0 lb

## 2016-06-19 DIAGNOSIS — F3170 Bipolar disorder, currently in remission, most recent episode unspecified: Secondary | ICD-10-CM

## 2016-06-19 NOTE — Progress Notes (Signed)
Patient: Stephanie Ball Female    DOB: February 09, 1956   61 y.o.   MRN: [id number] Visit Date: 06/19/2016  Today's Provider: Mar Daring, PA-C   Chief Complaint  Patient presents with  . Follow-up   Subjective:    HPI  Follow up for Bipolar  The patient was last seen for this 6 months ago. Changes made at last visit include no changes.  She reports excellent compliance with treatment. Patient reports she has been taking venlafaxine 75 mg TID for several years. Patient reports that her insurance is limiting quantity. Patient reports that she did try to fill her venlafaxine 225 mg tablet ER, but reports that she was having to pay $300 for a 90 day supply. She feels that condition is Improved. She is not having side effects.  ------------------------------------------------------------------------------------      Allergies  Allergen Reactions  . Aciphex  [Rabeprazole Sodium]   . Amoxicillin-Pot Clavulanate   . Iron   . Macrobid  [employer] Macro]   . Metoprolol Other (See Comments)    After taking medication for about 72 hours she noted elevated systolic BP and swelling of hands and feet and discontinued.     Current Outpatient Prescriptions:  .  allopurinol (ZYLOPRIM) 300 MG tablet, Take 1 tablet (300 mg total) by mouth daily., Disp: 90 tablet, Rfl: 1 .  aspirin 81 MG tablet, Take 2 tablets by mouth at bedtime., Disp: , Rfl:  .  busPIRone (BUSPAR) 15 MG tablet, Take 1 tablet (15 mg total) by mouth 2 (two) times daily., Disp: 180 tablet, Rfl: 3 .  clotrimazole-betamethasone (LOTRISONE) cream, Apply topically 2 (two) times daily., Disp: 30 g, Rfl: 2 .  hydrochlorothiazide (HYDRODIURIL) 12.5 MG tablet, Take 1 tablet (12.5 mg total) by mouth daily., Disp: 90 tablet, Rfl: 3 .  lisinopril (PRINIVIL,ZESTRIL) 40 MG tablet, Take 1 tablet (40 mg total) by mouth daily., Disp: 90 tablet, Rfl: 3 .  lovastatin (MEVACOR) 40 MG tablet, Take 1 tablet (40 mg  total) by mouth at bedtime., Disp: 90 tablet, Rfl: 3 .  montelukast (SINGULAIR) 10 MG tablet, Take 1 tablet (10 mg total) by mouth daily., Disp: 90 tablet, Rfl: 3 .  OLANZapine (ZYPREXA) 15 MG tablet, Take 1 tablet (15 mg total) by mouth daily., Disp: 90 tablet, Rfl: 3 .  traZODone (DESYREL) 100 MG tablet, Take 1 tablet (100 mg total) by mouth at bedtime as needed for sleep., Disp: 90 tablet, Rfl: 3 .  venlafaxine (EFFEXOR) 75 MG tablet, Take 1 tablet (75 mg total) by mouth 3 (three) times daily with meals., Disp: 270 tablet, Rfl: 3 .  zolpidem (AMBIEN) 10 MG tablet, Take 1 tablet (10 mg total) by mouth at bedtime as needed for sleep., Disp: 90 tablet, Rfl: 1  Review of Systems  Constitutional: Negative.   HENT: Negative.   Respiratory: Negative.   Cardiovascular: Negative.   Psychiatric/Behavioral: Negative.     Social History  Substance Use Topics  . Smoking status: Current Every Day Smoker  . Smokeless tobacco: Never Used     Comment: would like to quit; as stated pt smoked 1/2 PPd for 25 years and now is smoking 5-6 cigarettes per day.  . Alcohol use No   Objective:   BP 110/80 (BP Location: Right Arm, Patient Position: Sitting, Cuff Size: Large)   Pulse (!) 112   Temp 98.1 F (36.7 C) (Oral)   Resp 16   Wt 180 lb (81.6 kg)   SpO2 98%  BMI 31.89 kg/m   Physical Exam  Constitutional: She appears well-developed and well-nourished. No distress.  Skin: She is not diaphoretic.  Psychiatric: She has a normal mood and affect. Her behavior is normal. Judgment and thought content normal.        Assessment & Plan:     1. Bipolar disorder in full remission, most recent episode unspecified type ([place]) Stable and well controlled on venlafaxine 75mg  TID. [facility] and they stated it is now covered for venlafaxine 75mg  TID for $15. Patient will be no charged for today since we did not change medications.       Mar Daring, PA-C  [facility]

## 2016-10-09 ENCOUNTER — Telehealth: Payer: Self-pay | Admitting: Physician Assistant

## 2016-10-09 DIAGNOSIS — M109 Gout, unspecified: Secondary | ICD-10-CM

## 2016-10-09 MED ORDER — PREDNISONE 10 MG (21) PO TBPK: ORAL_TABLET | ORAL | 0 refills | Status: DC

## 2016-10-09 NOTE — Telephone Encounter (Signed)
Patient advised.

## 2016-10-09 NOTE — Telephone Encounter (Signed)
Request received from patient for prednisone 20mg . Can we see why she needs this? Thanks.

## 2016-10-09 NOTE — Telephone Encounter (Signed)
Prednisone taper sent to [facility]

## 2016-10-09 NOTE — Telephone Encounter (Signed)
Patient reports that you prescribed this to her when she had a gout flare in her foot. She reports that she has another flare. Onset about 2 days now.

## 2016-10-27 ENCOUNTER — Ambulatory Visit (INDEPENDENT_AMBULATORY_CARE_PROVIDER_SITE_OTHER): Payer: 59 | Admitting: Physician Assistant

## 2016-10-27 ENCOUNTER — Encounter: Payer: Self-pay | Admitting: Physician Assistant

## 2016-10-27 VITALS — BP 120/70 | HR 100 | Temp 98.7°F | Resp 16 | Wt 178.0 lb

## 2016-10-27 DIAGNOSIS — R300 Dysuria: Secondary | ICD-10-CM | POA: Diagnosis not present

## 2016-10-27 DIAGNOSIS — N3000 Acute cystitis without hematuria: Secondary | ICD-10-CM

## 2016-10-27 LAB — POCT URINALYSIS DIPSTICK
Bilirubin, UA: NEGATIVE
Glucose, UA: NEGATIVE
Ketones, UA: NEGATIVE
NITRITE UA: POSITIVE
PROTEIN UA: 100
Spec Grav, UA: 1.01 (ref 1.010–1.025)
Urobilinogen, UA: 0.2 E.U./dL
pH, UA: 6.5 (ref 5.0–8.0)

## 2016-10-27 MED ORDER — SULFAMETHOXAZOLE-TRIMETHOPRIM 800-160 MG PO TABS: 1.0000 | ORAL_TABLET | Freq: Two times a day (BID) | ORAL | 0 refills | Status: DC

## 2016-10-27 NOTE — Patient Instructions (Signed)

## 2016-10-27 NOTE — Progress Notes (Signed)
Patient: Stephanie Ball Female    DOB: 1955/12/22   61 y.o.   MRN: [id number] Visit Date: 10/27/2016  Today's Provider: Mar Daring, PA-C   Chief Complaint  Patient presents with  . Urinary Tract Infection   Subjective:    Urinary Tract Infection   This is a new problem. The current episode started in the past 7 days (Saturday night). The problem occurs every urination. The problem has been unchanged. The quality of the pain is described as burning and aching. The pain is at a severity of 5/10. The pain is moderate. There has been no fever. She is sexually active. Associated symptoms include frequency, hesitancy and urgency. Pertinent negatives include no chills, discharge, flank pain, hematuria, nausea, possible pregnancy, sweats or vomiting. She has tried increased fluids (On Saturday she tried Monistat) for the symptoms. The treatment provided no relief. There is no history of recurrent UTIs.  History of CKD Stage II     Allergies  Allergen Reactions  . Aciphex  [Rabeprazole Sodium]   . Amoxicillin-Pot Clavulanate   . Iron   . Macrobid  [employer] Macro]   . Metoprolol Other (See Comments)    After taking medication for about 72 hours she noted elevated systolic BP and swelling of hands and feet and discontinued.     Current Outpatient Prescriptions:  .  allopurinol (ZYLOPRIM) 300 MG tablet, Take 1 tablet (300 mg total) by mouth daily., Disp: 90 tablet, Rfl: 1 .  aspirin 81 MG tablet, Take 2 tablets by mouth at bedtime., Disp: , Rfl:  .  busPIRone (BUSPAR) 15 MG tablet, Take 1 tablet (15 mg total) by mouth 2 (two) times daily., Disp: 180 tablet, Rfl: 3 .  clotrimazole-betamethasone (LOTRISONE) cream, Apply topically 2 (two) times daily., Disp: 30 g, Rfl: 2 .  hydrochlorothiazide (HYDRODIURIL) 12.5 MG tablet, Take 1 tablet (12.5 mg total) by mouth daily., Disp: 90 tablet, Rfl: 3 .  lisinopril (PRINIVIL,ZESTRIL) 40 MG tablet, Take 1 tablet (40 mg  total) by mouth daily., Disp: 90 tablet, Rfl: 3 .  lovastatin (MEVACOR) 40 MG tablet, Take 1 tablet (40 mg total) by mouth at bedtime., Disp: 90 tablet, Rfl: 3 .  montelukast (SINGULAIR) 10 MG tablet, Take 1 tablet (10 mg total) by mouth daily., Disp: 90 tablet, Rfl: 3 .  OLANZapine (ZYPREXA) 15 MG tablet, Take 1 tablet (15 mg total) by mouth daily., Disp: 90 tablet, Rfl: 3 .  traZODone (DESYREL) 100 MG tablet, Take 1 tablet (100 mg total) by mouth at bedtime as needed for sleep., Disp: 90 tablet, Rfl: 3 .  venlafaxine (EFFEXOR) 75 MG tablet, Take 1 tablet (75 mg total) by mouth 3 (three) times daily with meals., Disp: 270 tablet, Rfl: 3 .  zolpidem (AMBIEN) 10 MG tablet, Take 1 tablet (10 mg total) by mouth at bedtime as needed for sleep., Disp: 90 tablet, Rfl: 1 .  predniSONE (STERAPRED UNI-PAK 21 TAB) 10 MG (21) TBPK tablet, Take as directed on package instructions (Patient not taking: Reported on 10/27/2016), Disp: 21 tablet, Rfl: 0  Review of Systems  Constitutional: Negative for chills and fever.  Cardiovascular: Negative for chest pain, palpitations and leg swelling.  Gastrointestinal: Negative for abdominal pain, nausea and vomiting.  Genitourinary: Positive for dysuria, frequency, hesitancy and urgency. Negative for flank pain, hematuria, pelvic pain, vaginal bleeding, vaginal discharge and vaginal pain.  Musculoskeletal: Negative for back pain.    Social History  Substance Use Topics  . Smoking status:  Current Every Day Smoker  . Smokeless tobacco: Never Used     Comment: would like to quit; as stated pt smoked 1/2 PPd for 25 years and now is smoking 5-6 cigarettes per day.  . Alcohol use No   Objective:   BP 120/70 (BP Location: Right Arm, Patient Position: Sitting, Cuff Size: Normal)   Pulse 100   Temp 98.7 F (37.1 C) (Oral)   Resp 16   Wt 178 lb (80.7 kg)   SpO2 97%   BMI 31.53 kg/m  Vitals:   10/27/16 0846  BP: 120/70  Pulse: 100  Resp: 16  Temp: 98.7 F (37.1 C)   TempSrc: Oral  SpO2: 97%  Weight: 178 lb (80.7 kg)     Physical Exam  Constitutional: She is oriented to person, place, and time. She appears well-developed and well-nourished. No distress.  Cardiovascular: Normal rate, regular rhythm and normal heart sounds.  Exam reveals no gallop and no friction rub.   No murmur heard. Pulmonary/Chest: Effort normal and breath sounds normal. No respiratory distress. She has no wheezes. She has no rales.  Abdominal: Soft. Normal appearance and bowel sounds are normal. She exhibits no distension and no mass. There is no hepatosplenomegaly. There is tenderness in the suprapubic area. There is no rebound, no guarding and no CVA tenderness.  Suprapubic pressure  Neurological: She is alert and oriented to person, place, and time.  Skin: Skin is warm and dry. She is not diaphoretic.  Vitals reviewed.     Assessment & Plan:     1. Acute cystitis without hematuria Worsening symptoms. UA positive. Will treat empirically with Bactrim as below. Continue cranberry juice. Continue to push fluids. Urine sent for culture. Will follow up pending C&S results. She is to call if symptoms do not improve or if they worsen.  - sulfamethoxazole-trimethoprim (BACTRIM DS,SEPTRA DS) 800-160 MG tablet; Take 1 tablet by mouth 2 (two) times daily.  Dispense: 14 tablet; Refill: 0 - Urine Culture  2. Dysuria UA positive for nitrites, leuks, hematuria and some protein.  - POCT urinalysis dipstick       Mar Daring, PA-C  [facility]

## 2016-10-29 ENCOUNTER — Telehealth: Payer: Self-pay | Admitting: Family Medicine

## 2016-10-29 NOTE — Telephone Encounter (Signed)
Check with Tawanna Sat.

## 2016-10-29 NOTE — Telephone Encounter (Signed)
Please review-aa 

## 2016-10-29 NOTE — Telephone Encounter (Signed)
Urine culture has shown bacterial growth but has not resulted yet for sensitivities. Once I receive sensitivities if the antibiotic (bactrim) that I put her on is resistant I will change. Continue antibiotic for now.

## 2016-10-29 NOTE — Telephone Encounter (Signed)
Pt was in on Tuesday.  She says she is still burning when she urinates and having to go very frequent.  Pt's call back is [phone]  Thanks Con Memos

## 2016-10-30 ENCOUNTER — Telehealth: Payer: Self-pay

## 2016-10-30 LAB — URINE CULTURE

## 2016-10-30 NOTE — Telephone Encounter (Signed)
-----   Message from Mar Daring, PA-C sent at 10/30/2016  8:46 AM EDT ----- Culture result finally returned. Urine did grow klebsiella pneumoniae and is susceptible to bactrim. Continue antibiotic until completed. Call next week if symptoms remain after you have completed the antibiotic.

## 2016-10-30 NOTE — Telephone Encounter (Signed)
Patient advised as directed below.  Thanks,  -Jakia Kennebrew 

## 2016-10-30 NOTE — Telephone Encounter (Signed)
Patient advised as directed below.  Thanks,  -[place] Current 

## 2016-11-30 LAB — HM MAMMOGRAPHY

## 2016-12-02 ENCOUNTER — Ambulatory Visit (INDEPENDENT_AMBULATORY_CARE_PROVIDER_SITE_OTHER): Payer: 59 | Admitting: Physician Assistant

## 2016-12-02 ENCOUNTER — Encounter: Payer: Self-pay | Admitting: Physician Assistant

## 2016-12-02 ENCOUNTER — Encounter: Payer: Self-pay | Admitting: Family Medicine

## 2016-12-02 VITALS — BP 120/80 | HR 104 | Temp 98.0°F | Resp 16 | Wt 178.8 lb

## 2016-12-02 DIAGNOSIS — G4700 Insomnia, unspecified: Secondary | ICD-10-CM

## 2016-12-02 DIAGNOSIS — I10 Essential (primary) hypertension: Secondary | ICD-10-CM | POA: Diagnosis not present

## 2016-12-02 DIAGNOSIS — F5101 Primary insomnia: Secondary | ICD-10-CM | POA: Diagnosis not present

## 2016-12-02 DIAGNOSIS — N182 Chronic kidney disease, stage 2 (mild): Secondary | ICD-10-CM | POA: Diagnosis not present

## 2016-12-02 DIAGNOSIS — F3170 Bipolar disorder, currently in remission, most recent episode unspecified: Secondary | ICD-10-CM | POA: Diagnosis not present

## 2016-12-02 DIAGNOSIS — E7800 Pure hypercholesterolemia, unspecified: Secondary | ICD-10-CM | POA: Diagnosis not present

## 2016-12-02 DIAGNOSIS — Z23 Encounter for immunization: Secondary | ICD-10-CM | POA: Diagnosis not present

## 2016-12-02 DIAGNOSIS — M109 Gout, unspecified: Secondary | ICD-10-CM

## 2016-12-02 DIAGNOSIS — F319 Bipolar disorder, unspecified: Secondary | ICD-10-CM | POA: Diagnosis not present

## 2016-12-02 DIAGNOSIS — J302 Other seasonal allergic rhinitis: Secondary | ICD-10-CM

## 2016-12-02 MED ORDER — ZOLPIDEM TARTRATE 10 MG PO TABS: 10.0000 mg | ORAL_TABLET | Freq: Every evening | ORAL | 1 refills | Status: DC | PRN

## 2016-12-02 MED ORDER — HYDROCHLOROTHIAZIDE 12.5 MG PO TABS: 12.5000 mg | ORAL_TABLET | Freq: Every day | ORAL | 3 refills | Status: DC

## 2016-12-02 MED ORDER — LOVASTATIN 40 MG PO TABS: 40.0000 mg | ORAL_TABLET | Freq: Every day | ORAL | 3 refills | Status: DC

## 2016-12-02 MED ORDER — VENLAFAXINE HCL 75 MG PO TABS: 75.0000 mg | ORAL_TABLET | Freq: Three times a day (TID) | ORAL | 3 refills | Status: DC

## 2016-12-02 MED ORDER — OLANZAPINE 15 MG PO TABS: 15.0000 mg | ORAL_TABLET | Freq: Every day | ORAL | 3 refills | Status: DC

## 2016-12-02 MED ORDER — BUSPIRONE HCL 15 MG PO TABS: 15.0000 mg | ORAL_TABLET | Freq: Two times a day (BID) | ORAL | 3 refills | Status: DC

## 2016-12-02 MED ORDER — MONTELUKAST SODIUM 10 MG PO TABS: 10.0000 mg | ORAL_TABLET | Freq: Every day | ORAL | 3 refills | Status: DC

## 2016-12-02 MED ORDER — TRAZODONE HCL 100 MG PO TABS: 100.0000 mg | ORAL_TABLET | Freq: Every evening | ORAL | 3 refills | Status: DC | PRN

## 2016-12-02 MED ORDER — ALLOPURINOL 300 MG PO TABS: 300.0000 mg | ORAL_TABLET | Freq: Every day | ORAL | 3 refills | Status: DC

## 2016-12-02 MED ORDER — LISINOPRIL 40 MG PO TABS: 40.0000 mg | ORAL_TABLET | Freq: Every day | ORAL | 3 refills | Status: DC

## 2016-12-02 NOTE — Progress Notes (Signed)
Patient: Stephanie Ball Female    DOB: [birth year]   61 y.o.   MRN: [id number] Visit Date: 12/02/2016  Today's Provider: Mar Daring, PA-C   Chief Complaint  Patient presents with  . Follow-up   Subjective:    HPI Patient here today to follow up on medications. Patient reports feeling well today denies any symptoms. Patient reports good tolerance and compliance with medications. Patient is requesting refill on all of her medications.   Patient refused screen today due to insurance.   Depression screen PHQ 2/9 03/13/2016  Decreased Interest 0  Down, Depressed, Hopeless 0  PHQ - 2 Score 0      Allergies  Allergen Reactions  . Aciphex  [Rabeprazole Sodium]   . Amoxicillin-Pot Clavulanate   . Iron   . Macrobid  [employer] Macro]   . Metoprolol Other (See Comments)    After taking medication for about 72 hours she noted elevated systolic BP and swelling of hands and feet and discontinued.     Current Outpatient Prescriptions:  .  allopurinol (ZYLOPRIM) 300 MG tablet, Take 1 tablet (300 mg total) by mouth daily., Disp: 90 tablet, Rfl: 1 .  aspirin 81 MG tablet, Take 2 tablets by mouth at bedtime., Disp: , Rfl:  .  busPIRone (BUSPAR) 15 MG tablet, Take 1 tablet (15 mg total) by mouth 2 (two) times daily., Disp: 180 tablet, Rfl: 3 .  clotrimazole-betamethasone (LOTRISONE) cream, Apply topically 2 (two) times daily., Disp: 30 g, Rfl: 2 .  hydrochlorothiazide (HYDRODIURIL) 12.5 MG tablet, Take 1 tablet (12.5 mg total) by mouth daily., Disp: 90 tablet, Rfl: 3 .  lisinopril (PRINIVIL,ZESTRIL) 40 MG tablet, Take 1 tablet (40 mg total) by mouth daily., Disp: 90 tablet, Rfl: 3 .  lovastatin (MEVACOR) 40 MG tablet, Take 1 tablet (40 mg total) by mouth at bedtime., Disp: 90 tablet, Rfl: 3 .  montelukast (SINGULAIR) 10 MG tablet, Take 1 tablet (10 mg total) by mouth daily., Disp: 90 tablet, Rfl: 3 .  OLANZapine (ZYPREXA) 15 MG tablet, Take 1 tablet (15 mg  total) by mouth daily., Disp: 90 tablet, Rfl: 3 .  sulfamethoxazole-trimethoprim (BACTRIM DS,SEPTRA DS) 800-160 MG tablet, Take 1 tablet by mouth 2 (two) times daily., Disp: 14 tablet, Rfl: 0 .  traZODone (DESYREL) 100 MG tablet, Take 1 tablet (100 mg total) by mouth at bedtime as needed for sleep., Disp: 90 tablet, Rfl: 3 .  venlafaxine (EFFEXOR) 75 MG tablet, Take 1 tablet (75 mg total) by mouth 3 (three) times daily with meals., Disp: 270 tablet, Rfl: 3 .  zolpidem (AMBIEN) 10 MG tablet, Take 1 tablet (10 mg total) by mouth at bedtime as needed for sleep., Disp: 90 tablet, Rfl: 1  Review of Systems  Constitutional: Negative.   Respiratory: Negative.   Cardiovascular: Negative.   Neurological: Negative.   Psychiatric/Behavioral: Negative.     Social History  Substance Use Topics  . Smoking status: Current Every Day Smoker  . Smokeless tobacco: Never Used     Comment: would like to quit; as stated pt smoked 1/2 PPd for 25 years and now is smoking 5-6 cigarettes per day.  . Alcohol use No   Objective:   BP 120/80 (BP Location: Left Arm, Patient Position: Sitting, Cuff Size: Large)   Pulse (!) 104   Temp 98 F (36.7 C) (Oral)   Resp 16   Wt 178 lb 12.8 oz (81.1 kg)   SpO2 98%   BMI 31.67 kg/m  Vitals:   12/02/16 0829  BP: 120/80  Pulse: (!) 104  Resp: 16  Temp: 98 F (36.7 C)  TempSrc: Oral  SpO2: 98%  Weight: 178 lb 12.8 oz (81.1 kg)     Physical Exam  Constitutional: She appears well-developed and well-nourished. No distress.  Neck: Normal range of motion. Neck supple. No JVD present. No tracheal deviation present. No thyromegaly present.  Cardiovascular: Normal rate, regular rhythm and normal heart sounds.  Exam reveals no gallop and no friction rub.   No murmur heard. Pulmonary/Chest: Effort normal and breath sounds normal. No respiratory distress. She has no wheezes. She has no rales.  Lymphadenopathy:    She has no cervical adenopathy.  Skin: She is not  diaphoretic.  Vitals reviewed.      Assessment & Plan:     1. Bipolar disorder in full remission, most recent episode unspecified type ([place]) Stable. Diagnosis pulled for medication refill. Continue current medical treatment plan. - busPIRone (BUSPAR) 15 MG tablet; Take 1 tablet (15 mg total) by mouth 2 (two) times daily.  Dispense: 180 tablet; Refill: 3 - venlafaxine (EFFEXOR) 75 MG tablet; Take 1 tablet (75 mg total) by mouth 3 (three) times daily with meals.  Dispense: 270 tablet; Refill: 3  2. Primary insomnia Stable. Diagnosis pulled for medication refill. Continue current medical treatment plan. - zolpidem (AMBIEN) 10 MG tablet; Take 1 tablet (10 mg total) by mouth at bedtime as needed for sleep.  Dispense: 90 tablet; Refill: 1  3. Benign essential HTN Stable. Diagnosis pulled for medication refill. Continue current medical treatment plan. - hydrochlorothiazide (HYDRODIURIL) 12.5 MG tablet; Take 1 tablet (12.5 mg total) by mouth daily.  Dispense: 90 tablet; Refill: 3 - lisinopril (PRINIVIL,ZESTRIL) 40 MG tablet; Take 1 tablet (40 mg total) by mouth daily.  Dispense: 90 tablet; Refill: 3  4. Chronic kidney disease (CKD), stage II (mild) Stable.   5. Other seasonal allergic rhinitis Stable. Diagnosis pulled for medication refill. Continue current medical treatment plan. - montelukast (SINGULAIR) 10 MG tablet; Take 1 tablet (10 mg total) by mouth daily.  Dispense: 90 tablet; Refill: 3  6. Insomnia, unspecified type Stable. Diagnosis pulled for medication refill. Continue current medical treatment plan. - traZODone (DESYREL) 100 MG tablet; Take 1 tablet (100 mg total) by mouth at bedtime as needed for sleep.  Dispense: 90 tablet; Refill: 3  7. Bipolar 1 disorder ([facility]) Stable. Diagnosis pulled for medication refill. Continue current medical treatment plan. - OLANZapine (ZYPREXA) 15 MG tablet; Take 1 tablet (15 mg total) by mouth daily.  Dispense: 90 tablet; Refill: 3  8. Gouty  arthritis Stable. Diagnosis pulled for medication refill. Continue current medical treatment plan. - allopurinol (ZYLOPRIM) 300 MG tablet; Take 1 tablet (300 mg total) by mouth daily.  Dispense: 90 tablet; Refill: 3  9. Hypercholesterolemia without hypertriglyceridemia Stable. Diagnosis pulled for medication refill. Continue current medical treatment plan. - lovastatin (MEVACOR) 40 MG tablet; Take 1 tablet (40 mg total) by mouth at bedtime.  Dispense: 90 tablet; Refill: 3  10. Need for Td vaccine Td Vaccine given to patient without complications. Patient sat for 15 minutes after administration and was tolerated well without adverse effects. - Td : Tetanus/diphtheria >7yo Preservative  free       Mar Daring, PA-C  [facility]

## 2017-01-05 ENCOUNTER — Ambulatory Visit (INDEPENDENT_AMBULATORY_CARE_PROVIDER_SITE_OTHER): Payer: 59 | Admitting: Physician Assistant

## 2017-01-05 ENCOUNTER — Encounter: Payer: Self-pay | Admitting: Physician Assistant

## 2017-01-05 VITALS — BP 110/62 | HR 102 | Temp 97.8°F | Resp 16 | Ht 63.0 in | Wt 180.0 lb

## 2017-01-05 DIAGNOSIS — Z1231 Encounter for screening mammogram for malignant neoplasm of breast: Secondary | ICD-10-CM

## 2017-01-05 DIAGNOSIS — N182 Chronic kidney disease, stage 2 (mild): Secondary | ICD-10-CM | POA: Diagnosis not present

## 2017-01-05 DIAGNOSIS — I10 Essential (primary) hypertension: Secondary | ICD-10-CM

## 2017-01-05 DIAGNOSIS — F3170 Bipolar disorder, currently in remission, most recent episode unspecified: Secondary | ICD-10-CM | POA: Diagnosis not present

## 2017-01-05 DIAGNOSIS — Z1159 Encounter for screening for other viral diseases: Secondary | ICD-10-CM | POA: Diagnosis not present

## 2017-01-05 DIAGNOSIS — Z1211 Encounter for screening for malignant neoplasm of colon: Secondary | ICD-10-CM | POA: Diagnosis not present

## 2017-01-05 DIAGNOSIS — E559 Vitamin D deficiency, unspecified: Secondary | ICD-10-CM

## 2017-01-05 DIAGNOSIS — Z1272 Encounter for screening for malignant neoplasm of vagina: Secondary | ICD-10-CM

## 2017-01-05 DIAGNOSIS — F17200 Nicotine dependence, unspecified, uncomplicated: Secondary | ICD-10-CM

## 2017-01-05 DIAGNOSIS — E7800 Pure hypercholesterolemia, unspecified: Secondary | ICD-10-CM | POA: Diagnosis not present

## 2017-01-05 DIAGNOSIS — Z23 Encounter for immunization: Secondary | ICD-10-CM

## 2017-01-05 DIAGNOSIS — Z0000 Encounter for general adult medical examination without abnormal findings: Secondary | ICD-10-CM | POA: Diagnosis not present

## 2017-01-05 DIAGNOSIS — Z1239 Encounter for other screening for malignant neoplasm of breast: Secondary | ICD-10-CM

## 2017-01-05 DIAGNOSIS — Z6831 Body mass index (BMI) 31.0-31.9, adult: Secondary | ICD-10-CM

## 2017-01-05 DIAGNOSIS — Z114 Encounter for screening for human immunodeficiency virus [HIV]: Secondary | ICD-10-CM

## 2017-01-05 LAB — POCT URINALYSIS DIPSTICK
Bilirubin, UA: NEGATIVE
GLUCOSE UA: NEGATIVE
Ketones, UA: NEGATIVE
Leukocytes, UA: NEGATIVE
NITRITE UA: NEGATIVE
PH UA: 7.5 (ref 5.0–8.0)
Protein, UA: NEGATIVE
SPEC GRAV UA: 1.01 (ref 1.010–1.025)
UROBILINOGEN UA: 0.2 U/dL

## 2017-01-05 LAB — IFOBT (OCCULT BLOOD): IMMUNOLOGICAL FECAL OCCULT BLOOD TEST: NEGATIVE

## 2017-01-05 NOTE — Progress Notes (Signed)
Patient: Stephanie Ball, Female    DOB: 1956-03-15, 61 y.o.   MRN: [id number] Visit Date: 01/05/2017  Today's Provider: Mar Daring, PA-C   Chief Complaint  Patient presents with  . Annual Exam   Subjective:    Annual physical exam Stephanie Ball is a 61 y.o. female who presents today for health maintenance and complete physical. She feels well. She reports exercising active with daily activies. She reports she is sleeping well.  03/13/16 CPE 07/23/10 Pap-neg 11/30/16 Mammogram-Bi-RADS 3; repeat in 6 months -----------------------------------------------------------------   Review of Systems  Constitutional: Negative.   HENT: Negative.   Eyes: Negative.   Respiratory: Negative.   Cardiovascular: Negative.   Gastrointestinal: Negative.   Endocrine: Negative.   Genitourinary: Negative.   Musculoskeletal: Negative.   Skin: Negative.   Allergic/Immunologic: Negative.   Neurological: Negative.   Hematological: Negative.   Psychiatric/Behavioral: Negative.     Social History      She  reports that she has been smoking.  She has never used smokeless tobacco. She reports that she does not drink alcohol or use drugs.       Social History   Social History  . Marital status: Married    Spouse name: N/A  . Number of children: N/A  . Years of education: N/A   Social History Main Topics  . Smoking status: Current Every Day Smoker  . Smokeless tobacco: Never Used     Comment: would like to quit; as stated pt smoked 1/2 PPd for 25 years and now is smoking 5-6 cigarettes per day.  . Alcohol use No  . Drug use: No  . Sexual activity: Not Asked   Other Topics Concern  . None   Social History Narrative  . None    Past Medical History:  Diagnosis Date  . Bipolar disorder ([place]) 08/24/2008  . Chronic kidney disease   . Depressive disorder 08/24/2008  . Disorder of kidney and ureter 07/19/2009  . Fibrocystic breast disease   . Hematuria 08/24/2008    . Hydronephrosis of left kidney   . Hypercalcemia 07/19/2009  . Hyperglycemia   . Hyperlipidemia 08/24/2008  . Hypertension 08/24/2008   essential, benign  . Hypokalemia   . Panic attack   . Pure hypercholesterolemia 09/28//2010     Patient Active Problem List   Diagnosis Date Noted  . Acute gouty arthritis 02/10/2016  . Insomnia 12/07/2014  . Anxiety 12/06/2014  . Allergic rhinitis 12/06/2014  . 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 Abnormal LFTs 12/06/2014  . Bloodgood disease 12/06/2014  . History of hydronephrosis 12/06/2014  . Blood glucose elevated 12/06/2014  . Decreased potassium in the blood 12/06/2014  . Arterial blood pressure decreased 12/06/2014  . History of panic attacks 12/06/2014  . Breath shortness 12/06/2014  . Abnormal mammogram 12/06/2014  . Abdominal pain 12/06/2014  . Chronic kidney disease (CKD), stage II (mild) 08/29/2012  . History of metabolic disorder 36/62/9476  . Kidney cysts 08/29/2012  . Calcium blood increased 07/19/2009  . Avitaminosis D 02/07/2009  . Hypercholesterolemia without hypertriglyceridemia 01/29/2009  . Compulsive tobacco user syndrome 09/21/2008  . Affective bipolar disorder ([place]) 08/24/2008  . Clinical depression 08/24/2008  . Endometriosis 08/24/2008  . Blood in the urine 08/24/2008  . Benign essential HTN 08/24/2008  . Syncope and collapse 08/24/2008    Past Surgical History:  Procedure Laterality Date  . ABDOMINAL HYSTERECTOMY     abdominal:due to fibroid and endometriosis. No history of abnormal paps. Ovaries removed  also.  Marland Kitchen CHOLECYSTECTOMY     no further information given  . KIDNEY SURGERY     as stated pt had kidney surgery with no further given  . OOPHORECTOMY Bilateral 1996   Dr, DeFrancisco  . TUBAL LIGATION      Family History        Family Status  Relation Status  . Mother Deceased       Cerebral aneurysm  . Father Deceased  . MGM Deceased       brain Aneurysm  . PGM Deceased  . Sister Alive  .  Daughter Alive  . Sister Alive        Her family history includes Anuerysm in her mother; Cancer in her paternal grandmother; Diabetes in her father.     Allergies  Allergen Reactions  . Aciphex  [Rabeprazole Sodium]   . Amoxicillin-Pot Clavulanate   . Iron   . Macrobid  [employer] Macro]   . Metoprolol Other (See Comments)    After taking medication for about 72 hours she noted elevated systolic BP and swelling of hands and feet and discontinued.     Current Outpatient Prescriptions:  .  allopurinol (ZYLOPRIM) 300 MG tablet, Take 1 tablet (300 mg total) by mouth daily., Disp: 90 tablet, Rfl: 3 .  aspirin 81 MG tablet, Take 2 tablets by mouth at bedtime., Disp: , Rfl:  .  busPIRone (BUSPAR) 15 MG tablet, Take 1 tablet (15 mg total) by mouth 2 (two) times daily., Disp: 180 tablet, Rfl: 3 .  clotrimazole-betamethasone (LOTRISONE) cream, Apply topically 2 (two) times daily., Disp: 30 g, Rfl: 2 .  hydrochlorothiazide (HYDRODIURIL) 12.5 MG tablet, Take 1 tablet (12.5 mg total) by mouth daily., Disp: 90 tablet, Rfl: 3 .  lisinopril (PRINIVIL,ZESTRIL) 40 MG tablet, Take 1 tablet (40 mg total) by mouth daily., Disp: 90 tablet, Rfl: 3 .  lovastatin (MEVACOR) 40 MG tablet, Take 1 tablet (40 mg total) by mouth at bedtime., Disp: 90 tablet, Rfl: 3 .  montelukast (SINGULAIR) 10 MG tablet, Take 1 tablet (10 mg total) by mouth daily., Disp: 90 tablet, Rfl: 3 .  OLANZapine (ZYPREXA) 15 MG tablet, Take 1 tablet (15 mg total) by mouth daily., Disp: 90 tablet, Rfl: 3 .  traZODone (DESYREL) 100 MG tablet, Take 1 tablet (100 mg total) by mouth at bedtime as needed for sleep., Disp: 90 tablet, Rfl: 3 .  venlafaxine (EFFEXOR) 75 MG tablet, Take 1 tablet (75 mg total) by mouth 3 (three) times daily with meals., Disp: 270 tablet, Rfl: 3 .  zolpidem (AMBIEN) 10 MG tablet, Take 1 tablet (10 mg total) by mouth at bedtime as needed for sleep., Disp: 90 tablet, Rfl: 1   Patient Care Team: Jerrol Banana., MD as PCP - General (Family Medicine)      Objective:   Vitals: BP 110/62 (BP Location: Left Arm, Patient Position: Sitting, Cuff Size: Large)   Pulse (!) 102   Temp 97.8 F (36.6 C) (Oral)   Resp 16   Ht 5\' 3"  (1.6 m)   Wt 180 lb (81.6 kg)   SpO2 98%   BMI 31.89 kg/m    Vitals:   01/05/17 0952  BP: 110/62  Pulse: (!) 102  Resp: 16  Temp: 97.8 F (36.6 C)  TempSrc: Oral  SpO2: 98%  Weight: 180 lb (81.6 kg)  Height: 5\' 3"  (1.6 m)     Physical Exam  Constitutional: She is oriented to person, place, and time. She appears  well-developed and well-nourished. No distress.  HENT:  Head: Normocephalic and atraumatic.  Right Ear: Hearing, tympanic membrane, external ear and ear canal normal.  Left Ear: Hearing, tympanic membrane, external ear and ear canal normal.  Nose: Nose normal.  Mouth/Throat: Uvula is midline, oropharynx is clear and moist and mucous membranes are normal. No oropharyngeal exudate.  Eyes: Pupils are equal, round, and reactive to light. Conjunctivae and EOM are normal. Right eye exhibits no discharge. Left eye exhibits no discharge. No scleral icterus.  Neck: Normal range of motion. Neck supple. No JVD present. Carotid bruit is not present. No tracheal deviation present. No thyromegaly present.  Cardiovascular: Normal rate, regular rhythm, normal heart sounds and intact distal pulses.  Exam reveals no gallop and no friction rub.   No murmur heard. Pulmonary/Chest: Effort normal and breath sounds normal. No respiratory distress. She has no wheezes. She has no rales. She exhibits no tenderness. Right breast exhibits no inverted nipple, no mass, no nipple discharge, no skin change and no tenderness. Left breast exhibits no inverted nipple, no mass, no nipple discharge, no skin change and no tenderness. Breasts are symmetrical.  Abdominal: Soft. Bowel sounds are normal. She exhibits no distension and no mass. There is no tenderness. There is no rebound  and no guarding. Hernia confirmed negative in the right inguinal area and confirmed negative in the left inguinal area.  Genitourinary: Vagina normal. There is no rash, tenderness, lesion or injury on the right labia. There is no rash, tenderness, lesion or injury on the left labia. No erythema in the vagina. No signs of injury around the vagina. No vaginal discharge found.  Genitourinary Comments: Uterus and ovaries surgically absent  Musculoskeletal: Normal range of motion. She exhibits no edema or tenderness.  Lymphadenopathy:    She has no cervical adenopathy.       Right: No inguinal adenopathy present.       Left: No inguinal adenopathy present.  Neurological: She is alert and oriented to person, place, and time.  Skin: Skin is warm and dry. No rash noted. She is not diaphoretic.  Psychiatric: She has a normal mood and affect. Her behavior is normal. Judgment and thought content normal.  Vitals reviewed.    Depression Screen PHQ 2/9 Scores 01/05/2017 12/02/2016 03/13/2016  PHQ - 2 Score 0 - 0  PHQ- 9 Score 1 - -  Exception Documentation - Patient refusal -      Assessment & Plan:     Routine Health Maintenance and Physical Exam  Exercise Activities and Dietary recommendations Goals    None      Immunization History  Administered Date(s) Administered  . Influenza-Unspecified 03/01/2015  . Td 12/02/2016  . Tdap 09/03/2005    Health Maintenance  Topic Date Due  . Hepatitis C Screening  Jul 30, 1955  . HIV Screening  04/08/1972  . COLONOSCOPY  04/09/2007  . INFLUENZA VACCINE  02/01/2017 (Originally 12/02/2016)  . PAP SMEAR  05/04/2017  . MAMMOGRAM  06/02/2017  . TETANUS/TDAP  12/03/2026     Discussed health benefits of physical activity, and encouraged her to engage in regular exercise appropriate for her age and condition.    1. Annual physical exam Normal physical exam today. Will check labs as below and f/u pending lab results. If labs are stable and WNL she will  not need to have these rechecked for one year at her next annual physical exam. She is to call the office in the meantime if she has any acute issue,  questions or concerns. - CBC with Differential/Platelet - Comprehensive metabolic panel - TSH - POCT urinalysis dipstick  2. Screening for vaginal cancer Pap collected today. Will send as below and f/u pending results. - Pap IG and HPV (high risk) DNA detection  3. Breast cancer screening Last mammogram was in 11/2016 and was BiRads:3, repeat in 6 months.  4. Benign essential HTN Stable. Continue current medical treatment plan with lisinopril 40mg , HCTZ 12.5mg . Will check labs as below and f/u pending results. - CBC with Differential/Platelet - Comprehensive metabolic panel - Lipid Panel With LDL/HDL Ratio  5. Chronic kidney disease (CKD), stage II (mild) Stable. Will check labs as below and f/u pending results. - Comprehensive metabolic panel  6. Hypercholesterolemia without hypertriglyceridemia Stable. Continue lovastatin 40mg . Will check labs as below and f/u pending results. - Lipid Panel With LDL/HDL Ratio  7. Colon cancer screening OC lite collected today and was negative. - IFOBT POC (occult bld, rslt in office); Future  8. Compulsive tobacco user syndrome Patient declines wanting to quit smoking at this time.   9. Avitaminosis D Will check labs as below and f/u pending results. - VITAMIN D 25 Hydroxy (Vit-D Deficiency, Fractures)  10. Bipolar disorder in full remission, most recent episode unspecified type ([place]) Stable. Continue zyprexa, venlafaxine, trazodone and Buspar as prescribed.   11. Need for hepatitis C screening test - Hepatitis C antibody  12. Screening for HIV (human immunodeficiency virus) - HIV antibody  13. BMI 31.0-31.9,adult Counseled patient on healthy lifestyle modifications including dieting and exercise.   14. Need for influenza vaccination Flu Vaccine given to patient without complications.  Patient sat for 15 minutes after administration and was tolerated well without adverse effects. - Flu Vaccine QUAD 6+ mos PF IM (Fluarix Quad PF)  --------------------------------------------------------------------    Mar Daring, PA-C  [facility]

## 2017-01-05 NOTE — Patient Instructions (Signed)
Health Maintenance for Postmenopausal Women Menopause is a normal process in which your reproductive ability comes to an end. This process happens gradually over a span of months to years, usually between the ages of 22 and 9. Menopause is complete when you have missed 12 consecutive menstrual periods. It is important to talk with your health care provider about some of the most common conditions that affect postmenopausal women, such as heart disease, cancer, and bone loss (osteoporosis). Adopting a healthy lifestyle and getting preventive care can help to promote your health and wellness. Those actions can also lower your chances of developing some of these common conditions. What should I know about menopause? During menopause, you may experience a number of symptoms, such as:  Moderate-to-severe hot flashes.  Night sweats.  Decrease in sex drive.  Mood swings.  Headaches.  Tiredness.  Irritability.  Memory problems.  Insomnia.  Choosing to treat or not to treat menopausal changes is an individual decision that you make with your health care provider. What should I know about hormone replacement therapy and supplements? Hormone therapy products are effective for treating symptoms that are associated with menopause, such as hot flashes and night sweats. Hormone replacement carries certain risks, especially as you become older. If you are thinking about using estrogen or estrogen with progestin treatments, discuss the benefits and risks with your health care provider. What should I know about heart disease and stroke? Heart disease, heart attack, and stroke become more likely as you age. This may be due, in part, to the hormonal changes that your body experiences during menopause. These can affect how your body processes dietary fats, triglycerides, and cholesterol. Heart attack and stroke are both medical emergencies. There are many things that you can do to help prevent heart disease  and stroke:  Have your blood pressure checked at least every 1-2 years. High blood pressure causes heart disease and increases the risk of stroke.  If you are 53-22 years old, ask your health care provider if you should take aspirin to prevent a heart attack or a stroke.  Do not use any tobacco products, including cigarettes, chewing tobacco, or electronic cigarettes. If you need help quitting, ask your health care provider.  It is important to eat a healthy diet and maintain a healthy weight. ? Be sure to include plenty of vegetables, fruits, low-fat dairy products, and lean protein. ? Avoid eating foods that are high in solid fats, added sugars, or salt (sodium).  Get regular exercise. This is one of the most important things that you can do for your health. ? Try to exercise for at least 150 minutes each week. The type of exercise that you do should increase your heart rate and make you sweat. This is known as moderate-intensity exercise. ? Try to do strengthening exercises at least twice each week. Do these in addition to the moderate-intensity exercise.  Know your numbers.Ask your health care provider to check your cholesterol and your blood glucose. Continue to have your blood tested as directed by your health care provider.  What should I know about cancer screening? There are several types of cancer. Take the following steps to reduce your risk and to catch any cancer development as early as possible. Breast Cancer  Practice breast self-awareness. ? This means understanding how your breasts normally appear and feel. ? It also means doing regular breast self-exams. Let your health care provider know about any changes, no matter how small.  If you are 40  or older, have a clinician do a breast exam (clinical breast exam or CBE) every year. Depending on your age, family history, and medical history, it may be recommended that you also have a yearly breast X-ray (mammogram).  If you  have a family history of breast cancer, talk with your health care provider about genetic screening.  If you are at high risk for breast cancer, talk with your health care provider about having an MRI and a mammogram every year.  Breast cancer (BRCA) gene test is recommended for women who have family members with BRCA-related cancers. Results of the assessment will determine the need for genetic counseling and BRCA1 and for BRCA2 testing. BRCA-related cancers include these types: ? Breast. This occurs in males or females. ? Ovarian. ? Tubal. This may also be called fallopian tube cancer. ? Cancer of the abdominal or pelvic lining (peritoneal cancer). ? Prostate. ? Pancreatic.  Cervical, Uterine, and Ovarian Cancer Your health care provider may recommend that you be screened regularly for cancer of the pelvic organs. These include your ovaries, uterus, and vagina. This screening involves a pelvic exam, which includes checking for microscopic changes to the surface of your cervix (Pap test).  For women ages 21-65, health care providers may recommend a pelvic exam and a Pap test every three years. For women ages 79-65, they may recommend the Pap test and pelvic exam, combined with testing for human papilloma virus (HPV), every five years. Some types of HPV increase your risk of cervical cancer. Testing for HPV may also be done on women of any age who have unclear Pap test results.  Other health care providers may not recommend any screening for nonpregnant women who are considered low risk for pelvic cancer and have no symptoms. Ask your health care provider if a screening pelvic exam is right for you.  If you have had past treatment for cervical cancer or a condition that could lead to cancer, you need Pap tests and screening for cancer for at least 20 years after your treatment. If Pap tests have been discontinued for you, your risk factors (such as having a new sexual partner) need to be  reassessed to determine if you should start having screenings again. Some women have medical problems that increase the chance of getting cervical cancer. In these cases, your health care provider may recommend that you have screening and Pap tests more often.  If you have a family history of uterine cancer or ovarian cancer, talk with your health care provider about genetic screening.  If you have vaginal bleeding after reaching menopause, tell your health care provider.  There are currently no reliable tests available to screen for ovarian cancer.  Lung Cancer Lung cancer screening is recommended for adults 69-62 years old who are at high risk for lung cancer because of a history of smoking. A yearly low-dose CT scan of the lungs is recommended if you:  Currently smoke.  Have a history of at least 30 pack-years of smoking and you currently smoke or have quit within the past 15 years. A pack-year is smoking an average of one pack of cigarettes per day for one year.  Yearly screening should:  Continue until it has been 15 years since you quit.  Stop if you develop a health problem that would prevent you from having lung cancer treatment.  Colorectal Cancer  This type of cancer can be detected and can often be prevented.  Routine colorectal cancer screening usually begins at  age 42 and continues through age 45.  If you have risk factors for colon cancer, your health care provider may recommend that you be screened at an earlier age.  If you have a family history of colorectal cancer, talk with your health care provider about genetic screening.  Your health care provider may also recommend using home test kits to check for hidden blood in your stool.  A small camera at the end of a tube can be used to examine your colon directly (sigmoidoscopy or colonoscopy). This is done to check for the earliest forms of colorectal cancer.  Direct examination of the colon should be repeated every  5-10 years until age 71. However, if early forms of precancerous polyps or small growths are found or if you have a family history or genetic risk for colorectal cancer, you may need to be screened more often.  Skin Cancer  Check your skin from head to toe regularly.  Monitor any moles. Be sure to tell your health care provider: ? About any new moles or changes in moles, especially if there is a change in a mole's shape or color. ? If you have a mole that is larger than the size of a pencil eraser.  If any of your family members has a history of skin cancer, especially at a young age, talk with your health care provider about genetic screening.  Always use sunscreen. Apply sunscreen liberally and repeatedly throughout the day.  Whenever you are outside, protect yourself by wearing long sleeves, pants, a wide-brimmed hat, and sunglasses.  What should I know about osteoporosis? Osteoporosis is a condition in which bone destruction happens more quickly than new bone creation. After menopause, you may be at an increased risk for osteoporosis. To help prevent osteoporosis or the bone fractures that can happen because of osteoporosis, the following is recommended:  If you are 46-71 years old, get at least 1,000 mg of calcium and at least 600 mg of vitamin D per day.  If you are older than age 55 but younger than age 65, get at least 1,200 mg of calcium and at least 600 mg of vitamin D per day.  If you are older than age 54, get at least 1,200 mg of calcium and at least 800 mg of vitamin D per day.  Smoking and excessive alcohol intake increase the risk of osteoporosis. Eat foods that are rich in calcium and vitamin D, and do weight-bearing exercises several times each week as directed by your health care provider. What should I know about how menopause affects my mental health? Depression may occur at any age, but it is more common as you become older. Common symptoms of depression  include:  Low or sad mood.  Changes in sleep patterns.  Changes in appetite or eating patterns.  Feeling an overall lack of motivation or enjoyment of activities that you previously enjoyed.  Frequent crying spells.  Talk with your health care provider if you think that you are experiencing depression. What should I know about immunizations? It is important that you get and maintain your immunizations. These include:  Tetanus, diphtheria, and pertussis (Tdap) booster vaccine.  Influenza every year before the flu season begins.  Pneumonia vaccine.  Shingles vaccine.  Your health care provider may also recommend other immunizations. This information is not intended to replace advice given to you by your health care provider. Make sure you discuss any questions you have with your health care provider. Document Released: 06/12/2005  Document Revised: 11/08/2015 Document Reviewed: 01/22/2015 Elsevier Interactive Patient Education  2018 [employer]  

## 2017-01-06 LAB — CBC WITH DIFFERENTIAL/PLATELET
BASOS: 0 %
Basophils Absolute: 0 10*3/uL (ref 0.0–0.2)
EOS (ABSOLUTE): 0.2 10*3/uL (ref 0.0–0.4)
EOS: 2 %
HEMATOCRIT: 40.4 % (ref 34.0–46.6)
HEMOGLOBIN: 14 g/dL (ref 11.1–15.9)
IMMATURE GRANS (ABS): 0 10*3/uL (ref 0.0–0.1)
IMMATURE GRANULOCYTES: 0 %
LYMPHS: 35 %
Lymphocytes Absolute: 4.2 10*3/uL — ABNORMAL HIGH (ref 0.7–3.1)
MCH: 30.8 pg (ref 26.6–33.0)
MCHC: 34.7 g/dL (ref 31.5–35.7)
MCV: 89 fL (ref 79–97)
Monocytes Absolute: 0.6 10*3/uL (ref 0.1–0.9)
Monocytes: 5 %
NEUTROS ABS: 6.8 10*3/uL (ref 1.4–7.0)
NEUTROS PCT: 58 %
Platelets: 335 10*3/uL (ref 150–379)
RBC: 4.54 x10E6/uL (ref 3.77–5.28)
RDW: 14.4 % (ref 12.3–15.4)
WBC: 11.9 10*3/uL — ABNORMAL HIGH (ref 3.4–10.8)

## 2017-01-06 LAB — COMPREHENSIVE METABOLIC PANEL
A/G RATIO: 1.7 (ref 1.2–2.2)
ALBUMIN: 4.3 g/dL (ref 3.5–5.5)
ALT: 14 IU/L (ref 0–32)
AST: 20 IU/L (ref 0–40)
Alkaline Phosphatase: 86 IU/L (ref 39–117)
BILIRUBIN TOTAL: 0.6 mg/dL (ref 0.0–1.2)
BUN / CREAT RATIO: 6 — AB (ref 9–23)
BUN: 8 mg/dL (ref 6–24)
CHLORIDE: 102 mmol/L (ref 96–106)
CO2: 22 mmol/L (ref 20–29)
Calcium: 10.8 mg/dL — ABNORMAL HIGH (ref 8.7–10.2)
Creatinine, Ser: 1.31 mg/dL — ABNORMAL HIGH (ref 0.57–1.00)
GFR calc Af Amer: 51 mL/min/{1.73_m2} — ABNORMAL LOW (ref >59)
GFR, EST NON AFRICAN AMERICAN: 45 mL/min/{1.73_m2} — AB (ref >59)
GLOBULIN, TOTAL: 2.6 g/dL (ref 1.5–4.5)
Glucose: 100 mg/dL — ABNORMAL HIGH (ref 65–99)
POTASSIUM: 3.5 mmol/L (ref 3.5–5.2)
Sodium: 140 mmol/L (ref 134–144)
Total Protein: 6.9 g/dL (ref 6.0–8.5)

## 2017-01-06 LAB — LIPID PANEL WITH LDL/HDL RATIO
Cholesterol, Total: 196 mg/dL (ref 100–199)
HDL: 50 mg/dL (ref >39)
LDL Calculated: 98 mg/dL (ref 0–99)
LDL/HDL RATIO: 2 ratio (ref 0.0–3.2)
Triglycerides: 242 mg/dL — ABNORMAL HIGH (ref 0–149)
VLDL Cholesterol Cal: 48 mg/dL — ABNORMAL HIGH (ref 5–40)

## 2017-01-06 LAB — HIV ANTIBODY (ROUTINE TESTING W REFLEX): HIV SCREEN 4TH GENERATION: NONREACTIVE

## 2017-01-06 LAB — HEPATITIS C ANTIBODY: Hep C Virus Ab: <0.1 s/co ratio (ref 0.0–0.9)

## 2017-01-06 LAB — VITAMIN D 25 HYDROXY (VIT D DEFICIENCY, FRACTURES): VIT D 25 HYDROXY: 13.8 ng/mL — AB (ref 30.0–100.0)

## 2017-01-06 LAB — TSH: TSH: 2.48 u[IU]/mL (ref 0.450–4.500)

## 2017-01-06 MED ORDER — VITAMIN D (ERGOCALCIFEROL) 1.25 MG (50000 UNIT) PO CAPS: 50000.0000 [IU] | ORAL_CAPSULE | ORAL | 3 refills | Status: DC

## 2017-01-06 NOTE — Addendum Note (Signed)
Addended by: Mar Daring on: 01/06/2017 01:56 PM   Modules accepted: Orders

## 2017-01-07 ENCOUNTER — Telehealth: Payer: Self-pay

## 2017-01-07 LAB — PAP IG AND HPV HIGH-RISK
HPV, HIGH-RISK: NEGATIVE
PAP SMEAR COMMENT: 0

## 2017-01-07 NOTE — Telephone Encounter (Signed)
Patient advised as directed below.  Thanks,  -Amahri Dengel 

## 2017-01-07 NOTE — Telephone Encounter (Signed)
-----   Message from Mar Daring, PA-C sent at 01/07/2017 10:12 AM EDT ----- Pap is normal, HPV negative. No need to recheck for 3-5 years, if desired.

## 2017-01-08 ENCOUNTER — Other Ambulatory Visit: Payer: Self-pay | Admitting: Physician Assistant

## 2017-01-08 DIAGNOSIS — G4700 Insomnia, unspecified: Secondary | ICD-10-CM

## 2017-01-13 ENCOUNTER — Encounter: Payer: Self-pay | Admitting: Family Medicine

## 2017-03-22 ENCOUNTER — Other Ambulatory Visit: Payer: Self-pay | Admitting: Family Medicine

## 2017-03-22 DIAGNOSIS — F3170 Bipolar disorder, currently in remission, most recent episode unspecified: Secondary | ICD-10-CM

## 2017-03-22 NOTE — Telephone Encounter (Signed)
Pt contacted office for refill request on the following medications:  busPIRone (BUSPAR) 15 MG tablet   [facility] [address]  FQ#[phone]

## 2017-03-22 NOTE — Telephone Encounter (Signed)
Please review. Thanks!  

## 2017-03-23 ENCOUNTER — Other Ambulatory Visit: Payer: Self-pay

## 2017-03-23 DIAGNOSIS — F3170 Bipolar disorder, currently in remission, most recent episode unspecified: Secondary | ICD-10-CM

## 2017-03-23 MED ORDER — BUSPIRONE HCL 15 MG PO TABS: 15.0000 mg | ORAL_TABLET | Freq: Two times a day (BID) | ORAL | 3 refills | Status: DC

## 2017-05-03 ENCOUNTER — Other Ambulatory Visit: Payer: Self-pay | Admitting: Physician Assistant

## 2017-05-03 ENCOUNTER — Telehealth: Payer: Self-pay | Admitting: Family Medicine

## 2017-05-03 DIAGNOSIS — F319 Bipolar disorder, unspecified: Secondary | ICD-10-CM

## 2017-05-03 MED ORDER — OLANZAPINE 15 MG PO TABS: 15.0000 mg | ORAL_TABLET | Freq: Every day | ORAL | 3 refills | Status: DC

## 2017-05-03 NOTE — Telephone Encounter (Signed)
Open in ERROR

## 2017-05-03 NOTE — Telephone Encounter (Signed)
[facility] pharmacy faxed a refill request for a 90-days supply for the following medication. Thanks CC  OLANZapine (ZYPREXA) 15 MG tablet

## 2017-05-25 ENCOUNTER — Ambulatory Visit: Payer: 59 | Admitting: Physician Assistant

## 2017-05-25 ENCOUNTER — Encounter: Payer: Self-pay | Admitting: Physician Assistant

## 2017-05-25 VITALS — BP 142/100 | HR 88 | Temp 97.6°F | Resp 16 | Ht 63.0 in | Wt 170.0 lb

## 2017-05-25 DIAGNOSIS — J40 Bronchitis, not specified as acute or chronic: Secondary | ICD-10-CM | POA: Diagnosis not present

## 2017-05-25 DIAGNOSIS — I10 Essential (primary) hypertension: Secondary | ICD-10-CM | POA: Diagnosis not present

## 2017-05-25 DIAGNOSIS — F5101 Primary insomnia: Secondary | ICD-10-CM | POA: Diagnosis not present

## 2017-05-25 MED ORDER — AZITHROMYCIN 250 MG PO TABS: ORAL_TABLET | ORAL | 0 refills | Status: DC

## 2017-05-25 MED ORDER — PREDNISONE 10 MG (21) PO TBPK: ORAL_TABLET | ORAL | 0 refills | Status: DC

## 2017-05-25 MED ORDER — TRAZODONE HCL 100 MG PO TABS: 100.0000 mg | ORAL_TABLET | Freq: Every evening | ORAL | 3 refills | Status: DC | PRN

## 2017-05-25 MED ORDER — ZOLPIDEM TARTRATE 10 MG PO TABS: 10.0000 mg | ORAL_TABLET | Freq: Every evening | ORAL | 1 refills | Status: DC | PRN

## 2017-05-25 NOTE — Patient Instructions (Signed)

## 2017-05-25 NOTE — Progress Notes (Signed)
Patient: Stephanie Ball Female    DOB: 1955-06-04   62 y.o.   MRN: [id number] Visit Date: 05/25/2017  Today's Provider: Mar Daring, PA-C   Chief Complaint  Patient presents with  . Hypertension  . Hyperlipidemia  . Follow-up  . URI   Subjective:    HPI  Hypertension, follow-up:  BP Readings from Last 3 Encounters:  05/25/17 (!) 142/100  01/05/17 110/62  12/02/16 120/80    She was last seen for hypertension 4 months ago.  BP at that visit was 110/62. Management changes since that visit include no changes. She reports excellent compliance with treatment. She is not having side effects.  She is not exercising. She is adherent to low salt diet.   Outside blood pressures are not being checked. She is experiencing none.  Patient denies chest pain and lower extremity edema.   Cardiovascular risk factors include hypertension and smoking/ tobacco exposure.  Use of agents associated with hypertension: decongestants.     Weight trend: stable Wt Readings from Last 3 Encounters:  05/25/17 170 lb (77.1 kg)  01/05/17 180 lb (81.6 kg)  12/02/16 178 lb 12.8 oz (81.1 kg)    Current diet: in general, a "healthy" diet    ------------------------------------------------------------------------   Lipid/Cholesterol, Follow-up:   Last seen for this4 months ago.  Management changes since that visit include no changes. . Last Lipid Panel:    Component Value Date/Time   CHOL 196 01/05/2017 1116   TRIG 242 (H) 01/05/2017 1116   HDL 50 01/05/2017 1116   [place] 98 01/05/2017 1116    Risk factors for vascular disease include hypercholesterolemia and hypertension  She reports excellent compliance with treatment. She is not having side effects.  Current symptoms include none and have been stable. Weight trend: stable Prior visit with dietician: no Current diet: in general, a "healthy" diet   Current exercise: housecleaning and no regular exercise  Wt  Readings from Last 3 Encounters:  05/25/17 170 lb (77.1 kg)  01/05/17 180 lb (81.6 kg)  12/02/16 178 lb 12.8 oz (81.1 kg)    -------------------------------------------------------------------  Follow up for vitamin d  The patient was last seen for this 4 months ago. Changes made at last visit include start Vitamin D 50,000 units once a week.  She reports excellent compliance with treatment. She feels that condition is Improved. She is not having side effects.   ------------------------------------------------------------------------------------  Patient C/O cough, congestion, shortness of breath, and wheezing. Patient reports she has been around family members with same symptoms since Christmas. Patient reports that her symptoms have worsened in the last 2 weeks.    Allergies  Allergen Reactions  . Aciphex  [Rabeprazole Sodium]   . Amoxicillin-Pot Clavulanate   . Iron   . Macrobid  [employer] Macro]   . Metoprolol Other (See Comments)    After taking medication for about 72 hours she noted elevated systolic BP and swelling of hands and feet and discontinued.     Current Outpatient Medications:  .  aspirin 81 MG tablet, Take 2 tablets by mouth at bedtime., Disp: , Rfl:  .  busPIRone (BUSPAR) 15 MG tablet, Take 1 tablet (15 mg total) 2 (two) times daily by mouth., Disp: 180 tablet, Rfl: 3 .  clotrimazole-betamethasone (LOTRISONE) cream, Apply topically 2 (two) times daily., Disp: 30 g, Rfl: 2 .  hydrochlorothiazide (HYDRODIURIL) 12.5 MG tablet, Take 1 tablet (12.5 mg total) by mouth daily., Disp: 90 tablet, Rfl: 3 .  lisinopril (PRINIVIL,ZESTRIL) 40 MG tablet, Take 1 tablet (40 mg total) by mouth daily., Disp: 90 tablet, Rfl: 3 .  lovastatin (MEVACOR) 40 MG tablet, Take 1 tablet (40 mg total) by mouth at bedtime., Disp: 90 tablet, Rfl: 3 .  montelukast (SINGULAIR) 10 MG tablet, Take 1 tablet (10 mg total) by mouth daily., Disp: 90 tablet, Rfl: 3 .  OLANZapine  (ZYPREXA) 15 MG tablet, Take 1 tablet (15 mg total) by mouth daily., Disp: 90 tablet, Rfl: 3 .  traZODone (DESYREL) 100 MG tablet, TAKE 1 TABLET (100 MG TOTAL) BY MOUTH AT BEDTIME AS NEEDED FOR SLEEP., Disp: 90 tablet, Rfl: 1 .  venlafaxine (EFFEXOR) 75 MG tablet, Take 1 tablet (75 mg total) by mouth 3 (three) times daily with meals., Disp: 270 tablet, Rfl: 3 .  Vitamin D, Ergocalciferol, (DRISDOL) 50000 units CAPS capsule, Take 1 capsule (50,000 Units total) by mouth every 7 (seven) days., Disp: 12 capsule, Rfl: 3 .  zolpidem (AMBIEN) 10 MG tablet, Take 1 tablet (10 mg total) by mouth at bedtime as needed for sleep., Disp: 90 tablet, Rfl: 1  Review of Systems  Constitutional: Positive for appetite change. Negative for fatigue and fever.  HENT: Positive for congestion, postnasal drip, rhinorrhea and sore throat. Negative for ear pain, sinus pressure, sinus pain and sneezing.   Respiratory: Positive for cough, chest tightness, shortness of breath and wheezing.   Cardiovascular: Negative.   Gastrointestinal: Negative.   Neurological: Negative.   Psychiatric/Behavioral: Negative.     Social History   Tobacco Use  . Smoking status: Current Every Day Smoker  . Smokeless tobacco: Never Used  . Tobacco comment: would like to quit; as stated pt smoked 1/2 PPd for 25 years and now is smoking 5-6 cigarettes per day.  Substance Use Topics  . Alcohol use: No   Objective:   BP (!) 142/100 (BP Location: Right Arm, Patient Position: Sitting, Cuff Size: Normal)   Pulse 88   Temp 97.6 F (36.4 C)   Resp 16   Ht 5\' 3"  (1.6 m)   Wt 170 lb (77.1 kg)   SpO2 97%   BMI 30.11 kg/m  Vitals:   05/25/17 0859  BP: (!) 142/100  Pulse: 88  Resp: 16  Temp: 97.6 F (36.4 C)  SpO2: 97%  Weight: 170 lb (77.1 kg)  Height: 5\' 3"  (1.6 m)     Physical Exam  Constitutional: She appears well-developed and well-nourished. No distress.  HENT:  Head: Normocephalic and atraumatic.  Right Ear: Hearing,  tympanic membrane, external ear and ear canal normal.  Left Ear: Hearing, tympanic membrane, external ear and ear canal normal.  Nose: Nose normal.  Mouth/Throat: Uvula is midline, oropharynx is clear and moist and mucous membranes are normal. No oropharyngeal exudate.  Eyes: Conjunctivae are normal. Pupils are equal, round, and reactive to light. Right eye exhibits no discharge. Left eye exhibits no discharge. No scleral icterus.  Neck: Normal range of motion. Neck supple. No tracheal deviation present. No thyromegaly present.  Cardiovascular: Normal rate, regular rhythm and normal heart sounds. Exam reveals no gallop and no friction rub.  No murmur heard. Pulmonary/Chest: Effort normal. No stridor. No respiratory distress. She has wheezes (fine expiratory heard diffusely throughout). She has no rales.  Lymphadenopathy:    She has no cervical adenopathy.  Skin: Skin is warm and dry. She is not diaphoretic.  Vitals reviewed.       Assessment & Plan:     1. Bronchitis Worsening. Will give zpak  and prednisone as below. Stop Dayquil/Nyquil as they are increasing BP. Advised may use Coricidin HBP or Mucinex DM for congestion. She is to call if symptoms fail to improve or worsen. - azithromycin (ZITHROMAX) 250 MG tablet; Take 2 tablets PO on day one, and one tablet PO daily thereafter until completed.  Dispense: 6 tablet; Refill: 0 - predniSONE (STERAPRED UNI-PAK 21 TAB) 10 MG (21) TBPK tablet; 6 day taper; take as directed on package instructions  Dispense: 21 tablet; Refill: 0  2. Benign essential HTN Elevated today due to Dayquil/Nyquil use over one month. Continue BP medications as prescribed. I will see her back in 6 months for medication f/u.  3. Primary insomnia Stable. Diagnosis pulled for medication refill. Continue current medical treatment plan. - zolpidem (AMBIEN) 10 MG tablet; Take 1 tablet (10 mg total) by mouth at bedtime as needed for sleep.  Dispense: 90 tablet; Refill: 1 -  traZODone (DESYREL) 100 MG tablet; Take 1 tablet (100 mg total) by mouth at bedtime as needed for sleep.  Dispense: 90 tablet; Refill: [place], PA-C  [facility]

## 2017-06-22 ENCOUNTER — Other Ambulatory Visit: Payer: Self-pay | Admitting: Physician Assistant

## 2017-07-03 ENCOUNTER — Emergency Department: Payer: 59

## 2017-07-03 ENCOUNTER — Other Ambulatory Visit: Payer: Self-pay

## 2017-07-03 ENCOUNTER — Encounter: Payer: Self-pay | Admitting: Emergency Medicine

## 2017-07-03 ENCOUNTER — Emergency Department
Admission: EM | Admit: 2017-07-03 | Discharge: 2017-07-03 | Disposition: A | Discharge status: Home / Self Care | Payer: 59 | Attending: Emergency Medicine | Admitting: Emergency Medicine

## 2017-07-03 DIAGNOSIS — Z7982 Long term (current) use of aspirin: Secondary | ICD-10-CM | POA: Insufficient documentation

## 2017-07-03 DIAGNOSIS — R229 Localized swelling, mass and lump, unspecified: Secondary | ICD-10-CM

## 2017-07-03 DIAGNOSIS — I129 Hypertensive chronic kidney disease with stage 1 through stage 4 chronic kidney disease, or unspecified chronic kidney disease: Secondary | ICD-10-CM | POA: Diagnosis not present

## 2017-07-03 DIAGNOSIS — Z79899 Other long term (current) drug therapy: Secondary | ICD-10-CM | POA: Insufficient documentation

## 2017-07-03 DIAGNOSIS — F17210 Nicotine dependence, cigarettes, uncomplicated: Secondary | ICD-10-CM | POA: Insufficient documentation

## 2017-07-03 DIAGNOSIS — R220 Localized swelling, mass and lump, head: Secondary | ICD-10-CM

## 2017-07-03 DIAGNOSIS — E876 Hypokalemia: Secondary | ICD-10-CM | POA: Diagnosis not present

## 2017-07-03 DIAGNOSIS — N182 Chronic kidney disease, stage 2 (mild): Secondary | ICD-10-CM | POA: Diagnosis not present

## 2017-07-03 DIAGNOSIS — IMO0002 Reserved for concepts with insufficient information to code with codable children: Secondary | ICD-10-CM

## 2017-07-03 DIAGNOSIS — R600 Localized edema: Secondary | ICD-10-CM | POA: Insufficient documentation

## 2017-07-03 LAB — CBC WITH DIFFERENTIAL/PLATELET
Basophils Absolute: 0.1 10*3/uL (ref 0–0.1)
Basophils Relative: 1 %
Eosinophils Absolute: 0.3 10*3/uL (ref 0–0.7)
Eosinophils Relative: 2 %
HEMATOCRIT: 41.5 % (ref 35.0–47.0)
HEMOGLOBIN: 14.3 g/dL (ref 12.0–16.0)
LYMPHS ABS: 4.3 10*3/uL — AB (ref 1.0–3.6)
Lymphocytes Relative: 40 %
MCH: 30 pg (ref 26.0–34.0)
MCHC: 34.6 g/dL (ref 32.0–36.0)
MCV: 86.8 fL (ref 80.0–100.0)
MONO ABS: 0.7 10*3/uL (ref 0.2–0.9)
MONOS PCT: 6 %
NEUTROS ABS: 5.4 10*3/uL (ref 1.4–6.5)
Neutrophils Relative %: 51 %
Platelets: 303 10*3/uL (ref 150–440)
RBC: 4.78 MIL/uL (ref 3.80–5.20)
RDW: 14.7 % — AB (ref 11.5–14.5)
WBC: 10.7 10*3/uL (ref 3.6–11.0)

## 2017-07-03 LAB — COMPREHENSIVE METABOLIC PANEL
ALBUMIN: 4.3 g/dL (ref 3.5–5.0)
ALK PHOS: 74 U/L (ref 38–126)
ALT: 13 U/L — AB (ref 14–54)
ANION GAP: 11 (ref 5–15)
AST: 19 U/L (ref 15–41)
BUN: 16 mg/dL (ref 6–20)
CHLORIDE: 102 mmol/L (ref 101–111)
CO2: 27 mmol/L (ref 22–32)
CREATININE: 1.09 mg/dL — AB (ref 0.44–1.00)
Calcium: 10.3 mg/dL (ref 8.9–10.3)
GFR calc Af Amer: >60 mL/min (ref >60)
GFR calc non Af Amer: 54 mL/min — ABNORMAL LOW (ref >60)
GLUCOSE: 96 mg/dL (ref 65–99)
Potassium: 2.8 mmol/L — ABNORMAL LOW (ref 3.5–5.1)
SODIUM: 140 mmol/L (ref 135–145)
Total Bilirubin: 0.8 mg/dL (ref 0.3–1.2)
Total Protein: 7.3 g/dL (ref 6.5–8.1)

## 2017-07-03 LAB — URIC ACID: Uric Acid, Serum: 8.7 mg/dL — ABNORMAL HIGH (ref 2.3–6.6)

## 2017-07-03 MED ORDER — PREDNISONE 10 MG PO TABS: 10.0000 mg | ORAL_TABLET | Freq: Every day | ORAL | 0 refills | Status: DC

## 2017-07-03 MED ORDER — DEXAMETHASONE SODIUM PHOSPHATE 10 MG/ML IJ SOLN
10.0000 mg | Freq: Once | INTRAMUSCULAR | Status: AC
  Administered 2017-07-03: 10 mg via INTRAVENOUS
  Filled 2017-07-03: qty 1

## 2017-07-03 MED ORDER — SODIUM CHLORIDE 0.9 % IV BOLUS (SEPSIS)
1000.0000 mL | Freq: Once | INTRAVENOUS | Status: AC
  Administered 2017-07-03: 1000 mL via INTRAVENOUS

## 2017-07-03 NOTE — ED Provider Notes (Signed)
[facility] Department Provider Note  ____________________________________________  Time seen: Approximately 6:21 PM  I have reviewed the triage vital signs and the nursing notes.   HISTORY  Chief Complaint Facial Pain    HPI Stephanie Ball is a 62 y.o. female who presents emergency department complaining of swelling and pain to the left mandibular region.  Patient reports that approximately an hour to an hour and a half ago she was eating ice cream when she noticed sudden sharp pain to the angle of the mandible as well as appreciable swelling and erythema.  Patient reports that this significantly increased in size and she presents the emergency department.  Patient has not take any medication for this complaint but states that edema is starting to subside somewhat.  Patient denies any pain currently.  She denies any difficulty breathing or swallowing.  Patient denies any dental issues, recent dental surgery, dental pain.  She denies any sore throat.  Patient has had no recent URI type infections with nasal congestion, sore throat, cough.  She denies any headache, visual changes, nasal congestion, sinus pressure, chest pain, shortness of breath, abdominal pain, nausea or vomiting.  No history of same.  No medications for this complaint prior to arrival.  Patient has a significant medical history of bipolar disorder, chronic kidney disease, hypercalcemia, hyperglycemia, hyperlipidemia, hypertension, hypokalemia.  Patient denies any complaints with chronic medical problems.  Past Medical History:  Diagnosis Date  . Bipolar disorder ([place]) 08/24/2008  . Chronic kidney disease   . Depressive disorder 08/24/2008  . Disorder of kidney and ureter 07/19/2009  . Fibrocystic breast disease   . Hematuria 08/24/2008  . Hydronephrosis of left kidney   . Hypercalcemia 07/19/2009  . Hyperglycemia   . Hyperlipidemia 08/24/2008  . Hypertension 08/24/2008    essential, benign  . Hypokalemia   . Panic attack   . Pure hypercholesterolemia 09/28//2010    Patient Active Problem List   Diagnosis Date Noted  . Acute gouty arthritis 02/10/2016  . Insomnia 12/07/2014  . Anxiety 12/06/2014  . Allergic rhinitis 12/06/2014  . 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 Abnormal LFTs 12/06/2014  . Bloodgood disease 12/06/2014  . History of hydronephrosis 12/06/2014  . Blood glucose elevated 12/06/2014  . Decreased potassium in the blood 12/06/2014  . Arterial blood pressure decreased 12/06/2014  . History of panic attacks 12/06/2014  . Breath shortness 12/06/2014  . Abnormal mammogram 12/06/2014  . Abdominal pain 12/06/2014  . Chronic kidney disease (CKD), stage II (mild) 08/29/2012  . History of metabolic disorder 16/02/9603  . Kidney cysts 08/29/2012  . Calcium blood increased 07/19/2009  . Avitaminosis D 02/07/2009  . Hypercholesterolemia without hypertriglyceridemia 01/29/2009  . Compulsive tobacco user syndrome 09/21/2008  . Affective bipolar disorder ([place]) 08/24/2008  . Clinical depression 08/24/2008  . Endometriosis 08/24/2008  . Blood in the urine 08/24/2008  . Benign essential HTN 08/24/2008  . Syncope and collapse 08/24/2008    Past Surgical History:  Procedure Laterality Date  . ABDOMINAL HYSTERECTOMY     abdominal:due to fibroid and endometriosis. No history of abnormal paps. Ovaries removed also.  . CHOLECYSTECTOMY     no further information given  . KIDNEY SURGERY     as stated pt had kidney surgery with no further given  . OOPHORECTOMY Bilateral 1996   Dr, DeFrancisco  . TUBAL LIGATION      Prior to Admission medications   Medication Sig Start Date End Date Taking? Authorizing Provider  aspirin 81 MG tablet Take  2 tablets by mouth at bedtime.    [provider]  azithromycin (ZITHROMAX) 250 MG tablet Take 2 tablets PO on day one, and one tablet PO daily thereafter until completed. 05/25/17   Mar Daring, PA-C   busPIRone (BUSPAR) 15 MG tablet Take 1 tablet (15 mg total) 2 (two) times daily by mouth. 03/23/17   Burnette, Clearnce Sorrel, PA-C  clotrimazole-betamethasone (LOTRISONE) cream APPLY TOPICALLY 2 TIMES DAILY 06/22/17   Mar Daring, PA-C  hydrochlorothiazide (HYDRODIURIL) 12.5 MG tablet Take 1 tablet (12.5 mg total) by mouth daily. 12/02/16   Mar Daring, PA-C  lisinopril (PRINIVIL,ZESTRIL) 40 MG tablet Take 1 tablet (40 mg total) by mouth daily. 12/02/16   Mar Daring, PA-C  lovastatin (MEVACOR) 40 MG tablet Take 1 tablet (40 mg total) by mouth at bedtime. 12/02/16   Mar Daring, PA-C  montelukast (SINGULAIR) 10 MG tablet Take 1 tablet (10 mg total) by mouth daily. 12/02/16   Mar Daring, PA-C  OLANZapine (ZYPREXA) 15 MG tablet Take 1 tablet (15 mg total) by mouth daily. 05/03/17   Mar Daring, PA-C  predniSONE (DELTASONE) 10 MG tablet Take 1 tablet (10 mg total) by mouth daily. 07/03/17   Naeem Quillin, Brittanny Bills, PA-C  traZODone (DESYREL) 100 MG tablet Take 1 tablet (100 mg total) by mouth at bedtime as needed for sleep. 05/25/17   Mar Daring, PA-C  venlafaxine (EFFEXOR) 75 MG tablet Take 1 tablet (75 mg total) by mouth 3 (three) times daily with meals. 12/02/16   Mar Daring, PA-C  Vitamin D, Ergocalciferol, (DRISDOL) 50000 units CAPS capsule Take 1 capsule (50,000 Units total) by mouth every 7 (seven) days. 01/06/17   Mar Daring, PA-C  zolpidem (AMBIEN) 10 MG tablet Take 1 tablet (10 mg total) by mouth at bedtime as needed for sleep. 05/25/17   Mar Daring, PA-C    Allergies Aciphex  [rabeprazole sodium]; Amoxicillin-pot clavulanate; Iron; Macrobid  [nitrofurantoin monohyd macro]; and Metoprolol  Family History  Problem Relation Age of Onset  . Anuerysm Mother   . Diabetes Father   . Cancer Paternal Grandmother        skin    Social History Social History   Tobacco Use  . Smoking status: Current Every Day Smoker   . Smokeless tobacco: Never Used  . Tobacco comment: would like to quit; as stated pt smoked 1/2 PPd for 25 years and now is smoking 5-6 cigarettes per day.  Substance Use Topics  . Alcohol use: No  . Drug use: No     Review of Systems  Constitutional: No fever/chills Eyes: No visual changes. No discharge ENT: Positive for left external jaw swelling and erythema.  No sore throat. Cardiovascular: no chest pain. Respiratory: no cough. No SOB. Gastrointestinal: No abdominal pain.  No nausea, no vomiting.  Musculoskeletal: Negative for musculoskeletal pain. Skin: Negative for rash, abrasions, lacerations, ecchymosis. Neurological: Negative for headaches, focal weakness or numbness. 10-point ROS otherwise negative.  ____________________________________________   PHYSICAL EXAM:  VITAL SIGNS: ED Triage Vitals  Enc Vitals Group     BP 07/03/17 1714 (!) 153/69     Pulse Rate 07/03/17 1714 82     Resp 07/03/17 1714 18     Temp 07/03/17 1714 98.4 F (36.9 C)     Temp Source 07/03/17 1714 Oral     SpO2 07/03/17 1714 100 %     Weight 07/03/17 1715 168 lb (76.2 kg)     Height 07/03/17  1715 5\' 2"  (1.575 m)     Head Circumference --      Peak Flow --      Pain Score 07/03/17 1715 5     Pain Loc --      Pain Edu? --      Excl. in [place]? --      Constitutional: Alert and oriented. Well appearing and in no acute distress. Eyes: Conjunctivae are normal. PERRL. EOMI. Head: Moderate erythema and edema noted to the angle of the mandible left side.  Area is mildly tender to palpation but no fluctuance or induration is appreciated.  No other visible lesions or findings to the skull or face.  No other tenderness to palpation over the osseous structures of the skull and face.  No preauricular or postauricular lymphadenopathy.  No ecchymosis appreciated. ENT:      Ears:       Nose: No congestion/rhinnorhea.      Mouth/Throat: Mucous membranes are moist.  Oropharynx with no erythema or edema.   Dentition is intact with no signs of infection.  Uvula is midline.  Tonsils are neither erythematous nor edematous.  No exudates.  Uvula is midline. Neck: No stridor.  Supple full range of motion Hematological/Lymphatic/Immunilogical: No cervical lymphadenopathy. Cardiovascular: Normal rate, regular rhythm. Normal S1 and S2.  Good peripheral circulation. Respiratory: Normal respiratory effort without tachypnea or retractions. Lungs CTAB. Good air entry to the bases with no decreased or absent breath sounds. Musculoskeletal: Full range of motion to all extremities. No gross deformities appreciated. Neurologic:  Normal speech and language. No gross focal neurologic deficits are appreciated.  Skin:  Skin is warm, dry and intact. No rash noted. Psychiatric: Mood and affect are normal. Speech and behavior are normal. Patient exhibits appropriate insight and judgement.   ____________________________________________   LABS (all labs ordered are listed, but only abnormal results are displayed)  Labs Reviewed  COMPREHENSIVE METABOLIC PANEL - Abnormal; Notable for the following components:      Result Value   Potassium 2.8 (*)    Creatinine, Ser 1.09 (*)    ALT 13 (*)    GFR calc non Af Amer 54 (*)    All other components within normal limits  CBC WITH DIFFERENTIAL/PLATELET - Abnormal; Notable for the following components:   RDW 14.7 (*)    Lymphs Abs 4.3 (*)    All other components within normal limits  URIC ACID - Abnormal; Notable for the following components:   Uric Acid, Serum 8.7 (*)    All other components within normal limits   ____________________________________________  EKG   ____________________________________________  RADIOLOGY Diamantina [facility] Marcine Gadway, personally viewed and evaluated the written report by the radiologist.  US Soft Tissue Head & Neck (non-thyroid)  Result Date: 07/03/2017 CLINICAL DATA:  Redness and swelling in the left side of the mandible today. EXAM:  ULTRASOUND OF HEAD/NECK SOFT TISSUES TECHNIQUE: Ultrasound examination of the head and neck soft tissues was performed in the area of clinical concern. COMPARISON:  None. FINDINGS: Scanning was directed toward the region of concern about the left parotid gland. No fluid collection or mass is identified. No abnormality is seen. The areas appear symmetric from right to left. IMPRESSION: Normal exam. Electronically Signed   By: Inge Rise M.D.   On: 07/03/2017 19:51    ____________________________________________    PROCEDURES  Procedure(s) performed:    Procedures    Medications  sodium chloride 0.9 % bolus 1,000 mL (0 mLs Intravenous Stopped 07/03/17 2043)  dexamethasone (DECADRON) injection 10 mg (10 mg Intravenous Given 07/03/17 2042)     ____________________________________________   INITIAL IMPRESSION / ASSESSMENT AND PLAN / ED COURSE  Pertinent labs & imaging results that were available during my care of the patient were reviewed by me and considered in my medical decision making (see chart for details).  Review of the [place] CSRS was performed in accordance of the [place] prior to dispensing any controlled drugs.     Patient's diagnosis is consistent with left-sided facial swelling.  Patient presented with sudden onset of left-sided facial swelling.  Patient denies any respiratory complaints.  No hives or pruritus.  Exam reveals edema to the angle of the left mandible.  No fluctuance or induration.  No warmth to palpation.  Patient has chronic kidney failure and as such ultrasound is ordered to evaluate area versus CT scan with contrast.  Basic labs are also ordered.  Patient has mild hypokalemia consistent with previous known diagnosis.  Patient is advised to take potassium supplements for same.  Labs are otherwise reassuring.  Patient does have an elevated uric acid with a history of gout.  I suspect that elevated uric acid level are noncontributory to the patient's current  symptoms.  At this time, patient's symptoms have been improving without any medication.  I will prescribe a course of steroids for inflammation control.  At this time, there is no signs of infection requiring antibiotics.  The patient's elevated uric acid level, if this should in fact be associated with symptoms,should improve with prescribed  corticosteroids.  Patient will be discharged home with prescriptions for short course of prednisone. Patient is to follow up with primary care as needed or otherwise directed. Patient is given ED precautions to return to the ED for any worsening or new symptoms.     ____________________________________________  FINAL CLINICAL IMPRESSION(S) / ED DIAGNOSES  Final diagnoses:  Mass  Left facial swelling      NEW MEDICATIONS STARTED DURING THIS VISIT:  ED Discharge Orders        Ordered    predniSONE (DELTASONE) 10 MG tablet  Daily    Comments:  Take 6 pills x 2 days, 5 pills x 2 days, 4 pills x 2 days, 3 pills x 2 days, 2 pills x 2 days, and 1 pill x 2 days   07/03/17 2033          This chart was dictated using voice recognition software/Dragon. Despite best efforts to proofread, errors can occur which can change the meaning. Any change was purely unintentional.    Darletta Moll, PA-C 07/04/17 0058    Carrie Mew, MD 07/04/17 [phone]

## 2017-07-03 NOTE — ED Triage Notes (Signed)
States was eating ice cream about 1/2 hour ago and sudden painful swelling below L jaw. Tender to touch now.

## 2017-07-03 NOTE — ED Notes (Signed)
Pt discharged to home.  Family member driving.  Discharge instructions reviewed.  Verbalized understanding.  No questions or concerns at this time.  Teach back verified.  Pt in NAD.  No items left in ED.   

## 2017-10-06 ENCOUNTER — Ambulatory Visit (INDEPENDENT_AMBULATORY_CARE_PROVIDER_SITE_OTHER): Payer: 59 | Admitting: Physician Assistant

## 2017-10-06 ENCOUNTER — Encounter: Payer: 59 | Admitting: Physician Assistant

## 2017-10-06 ENCOUNTER — Encounter: Payer: Self-pay | Admitting: Physician Assistant

## 2017-10-06 VITALS — BP 140/98 | HR 103 | Resp 16 | Ht 63.0 in | Wt 167.4 lb

## 2017-10-06 DIAGNOSIS — N182 Chronic kidney disease, stage 2 (mild): Secondary | ICD-10-CM

## 2017-10-06 DIAGNOSIS — E876 Hypokalemia: Secondary | ICD-10-CM

## 2017-10-06 DIAGNOSIS — Z1239 Encounter for other screening for malignant neoplasm of breast: Secondary | ICD-10-CM

## 2017-10-06 DIAGNOSIS — Z1231 Encounter for screening mammogram for malignant neoplasm of breast: Secondary | ICD-10-CM | POA: Diagnosis not present

## 2017-10-06 DIAGNOSIS — F3170 Bipolar disorder, currently in remission, most recent episode unspecified: Secondary | ICD-10-CM

## 2017-10-06 DIAGNOSIS — F5101 Primary insomnia: Secondary | ICD-10-CM

## 2017-10-06 DIAGNOSIS — E559 Vitamin D deficiency, unspecified: Secondary | ICD-10-CM

## 2017-10-06 DIAGNOSIS — Z0000 Encounter for general adult medical examination without abnormal findings: Secondary | ICD-10-CM | POA: Diagnosis not present

## 2017-10-06 DIAGNOSIS — E7800 Pure hypercholesterolemia, unspecified: Secondary | ICD-10-CM

## 2017-10-06 DIAGNOSIS — I10 Essential (primary) hypertension: Secondary | ICD-10-CM

## 2017-10-06 DIAGNOSIS — R739 Hyperglycemia, unspecified: Secondary | ICD-10-CM | POA: Diagnosis not present

## 2017-10-06 MED ORDER — ZOLPIDEM TARTRATE 10 MG PO TABS: 10.0000 mg | ORAL_TABLET | Freq: Every evening | ORAL | 1 refills | Status: DC | PRN

## 2017-10-06 NOTE — Progress Notes (Signed)
Patient: Stephanie Ball, Female    DOB: 1955/07/14, 62 y.o.   MRN: [id number] Visit Date: 10/06/2017  Today's Provider: Mar Daring, PA-C   Chief Complaint  Patient presents with  . Annual Exam   Subjective:    Annual physical exam Stephanie Ball is a 62 y.o. female who presents today for health maintenance and complete physical. She feels well. She reports exercising none but is active with daily activities. She reports she is sleeping well. Patient needs her Biometric form fill out.  Last CPE:01/05/17 Pap:01/05/17-Negative, HPV-Negative Mammogram:12/02/16-BI-RADS 3. iFOBT-01/05/17-Negative  -----------------------------------------------------------------   Review of Systems  Constitutional: Negative.   HENT: Negative.   Eyes: Negative.   Respiratory: Negative.   Cardiovascular: Negative.   Gastrointestinal: Positive for constipation.  Endocrine: Negative.   Genitourinary: Negative.   Musculoskeletal: Negative.   Skin: Negative.   Allergic/Immunologic: Negative.   Neurological: Negative.   Hematological: Negative.   Psychiatric/Behavioral: Negative.     Social History      She  reports that she has been smoking.  She has never used smokeless tobacco. She reports that she does not drink alcohol or use drugs.       Social History   Socioeconomic History  . Marital status: Married    Spouse name: Not on file  . Number of children: Not on file  . Years of education: Not on file  . Highest education level: Not on file  Occupational History  . Not on file  Social Needs  . Financial resource strain: Not on file  . Food insecurity:    Worry: Not on file    Inability: Not on file  . Transportation needs:    Medical: Not on file    Non-medical: Not on file  Tobacco Use  . Smoking status: Current Every Day Smoker  . Smokeless tobacco: Never Used  . Tobacco comment: would like to quit; as stated pt smoked 1/2 PPd for 25 years and now is  smoking 5-6 cigarettes per day.  Substance and Sexual Activity  . Alcohol use: No  . Drug use: No  . Sexual activity: Not on file  Lifestyle  . Physical activity:    Days per week: Not on file    Minutes per session: Not on file  . Stress: Not on file  Relationships  . Social connections:    Talks on phone: Not on file    Gets together: Not on file    Attends religious service: Not on file    Active member of club or organization: Not on file    Attends meetings of clubs or organizations: Not on file    Relationship status: Not on file  Other Topics Concern  . Not on file  Social History Narrative  . Not on file    Past Medical History:  Diagnosis Date  . Bipolar disorder ([place]) 08/24/2008  . Chronic kidney disease   . Depressive disorder 08/24/2008  . Disorder of kidney and ureter 07/19/2009  . Fibrocystic breast disease   . Hematuria 08/24/2008  . Hydronephrosis of left kidney   . Hypercalcemia 07/19/2009  . Hyperglycemia   . Hyperlipidemia 08/24/2008  . Hypertension 08/24/2008   essential, benign  . Hypokalemia   . Panic attack   . Pure hypercholesterolemia 09/28//2010     Patient Active Problem List   Diagnosis Date Noted  . Acute gouty arthritis 02/10/2016  . Insomnia 12/07/2014  . Anxiety 12/06/2014  . Allergic rhinitis 12/06/2014  .  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 Abnormal LFTs 12/06/2014  . Bloodgood disease 12/06/2014  . History of hydronephrosis 12/06/2014  . Blood glucose elevated 12/06/2014  . Decreased potassium in the blood 12/06/2014  . Arterial blood pressure decreased 12/06/2014  . History of panic attacks 12/06/2014  . Breath shortness 12/06/2014  . Abnormal mammogram 12/06/2014  . Abdominal pain 12/06/2014  . Chronic kidney disease (CKD), stage II (mild) 08/29/2012  . History of metabolic disorder 37/85/8850  . Kidney cysts 08/29/2012  . Calcium blood increased 07/19/2009  . Avitaminosis D 02/07/2009  . Hypercholesterolemia without  hypertriglyceridemia 01/29/2009  . Compulsive tobacco user syndrome 09/21/2008  . Affective bipolar disorder ([place]) 08/24/2008  . Clinical depression 08/24/2008  . Endometriosis 08/24/2008  . Blood in the urine 08/24/2008  . Benign essential HTN 08/24/2008  . Syncope and collapse 08/24/2008    Past Surgical History:  Procedure Laterality Date  . ABDOMINAL HYSTERECTOMY     abdominal:due to fibroid and endometriosis. No history of abnormal paps. Ovaries removed also.  . CHOLECYSTECTOMY     no further information given  . KIDNEY SURGERY     as stated pt had kidney surgery with no further given  . OOPHORECTOMY Bilateral 1996   Dr, DeFrancisco  . TUBAL LIGATION      Family History        Family Status  Relation Name Status  . Mother  Deceased       Cerebral aneurysm  . Father  Deceased  . MGM  Deceased       brain Aneurysm  . PGM  Deceased  . Sister  Alive  . Daughter  Alive  . Sister  Alive        Her family history includes Anuerysm in her mother; Cancer in her paternal grandmother; Diabetes in her father.      Allergies  Allergen Reactions  . Aciphex  [Rabeprazole Sodium]   . Amoxicillin-Pot Clavulanate   . Iron   . Macrobid  [employer] Macro]   . Metoprolol Other (See Comments)    After taking medication for about 72 hours she noted elevated systolic BP and swelling of hands and feet and discontinued.     Current Outpatient Medications:  .  aspirin 81 MG tablet, Take 2 tablets by mouth at bedtime., Disp: , Rfl:  .  azithromycin (ZITHROMAX) 250 MG tablet, Take 2 tablets PO on day one, and one tablet PO daily thereafter until completed., Disp: 6 tablet, Rfl: 0 .  busPIRone (BUSPAR) 15 MG tablet, Take 1 tablet (15 mg total) 2 (two) times daily by mouth., Disp: 180 tablet, Rfl: 3 .  clotrimazole-betamethasone (LOTRISONE) cream, APPLY TOPICALLY 2 TIMES DAILY, Disp: 30 g, Rfl: 5 .  hydrochlorothiazide (HYDRODIURIL) 12.5 MG tablet, Take 1 tablet (12.5 mg  total) by mouth daily., Disp: 90 tablet, Rfl: 3 .  lisinopril (PRINIVIL,ZESTRIL) 40 MG tablet, Take 1 tablet (40 mg total) by mouth daily., Disp: 90 tablet, Rfl: 3 .  lovastatin (MEVACOR) 40 MG tablet, Take 1 tablet (40 mg total) by mouth at bedtime., Disp: 90 tablet, Rfl: 3 .  montelukast (SINGULAIR) 10 MG tablet, Take 1 tablet (10 mg total) by mouth daily., Disp: 90 tablet, Rfl: 3 .  OLANZapine (ZYPREXA) 15 MG tablet, Take 1 tablet (15 mg total) by mouth daily., Disp: 90 tablet, Rfl: 3 .  traZODone (DESYREL) 100 MG tablet, Take 1 tablet (100 mg total) by mouth at bedtime as needed for sleep., Disp: 90 tablet, Rfl: 3 .  venlafaxine (EFFEXOR) 75 MG tablet, Take 1 tablet (75 mg total) by mouth 3 (three) times daily with meals., Disp: 270 tablet, Rfl: 3 .  Vitamin D, Ergocalciferol, (DRISDOL) 50000 units CAPS capsule, Take 1 capsule (50,000 Units total) by mouth every 7 (seven) days., Disp: 12 capsule, Rfl: 3 .  zolpidem (AMBIEN) 10 MG tablet, Take 1 tablet (10 mg total) by mouth at bedtime as needed for sleep., Disp: 90 tablet, Rfl: 1 .  predniSONE (DELTASONE) 10 MG tablet, Take 1 tablet (10 mg total) by mouth daily. (Patient not taking: Reported on 10/06/2017), Disp: 42 tablet, Rfl: 0   Patient Care Team: Jerrol Banana., MD as PCP - General (Family Medicine)      Objective:   Vitals: BP (!) 140/98 (BP Location: Right Arm, Patient Position: Sitting, Cuff Size: Normal)   Pulse (!) 103   Resp 16   Ht 5\' 3"  (1.6 m)   Wt 167 lb 6.4 oz (75.9 kg)   BMI 29.65 kg/m     Physical Exam  Constitutional: She appears well-developed and well-nourished.  HENT:  Head: Normocephalic.  Right Ear: Hearing, tympanic membrane, external ear and ear canal normal.  Left Ear: Hearing, tympanic membrane, external ear and ear canal normal.  Nose: Nose normal.  Mouth/Throat: Uvula is midline, oropharynx is clear and moist and mucous membranes are normal.  Eyes: Pupils are equal, round, and reactive to  light. Conjunctivae and EOM are normal.  Neck: Normal range of motion. Carotid bruit is not present.  Cardiovascular: Normal rate, regular rhythm, normal heart sounds and intact distal pulses.  Pulmonary/Chest: Effort normal and breath sounds normal. Right breast exhibits no inverted nipple, no mass, no nipple discharge, no skin change and no tenderness. Left breast exhibits no inverted nipple, no mass, no nipple discharge, no skin change and no tenderness. No breast swelling, tenderness, discharge or bleeding. Breasts are symmetrical.  Abdominal: Soft. Bowel sounds are normal.  Musculoskeletal: Normal range of motion.  Skin: Skin is warm.  Psychiatric: She has a normal mood and affect. Her behavior is normal. Judgment and thought content normal.  Vitals reviewed.    Depression Screen PHQ 2/9 Scores 10/06/2017 01/05/2017 12/02/2016 03/13/2016  PHQ - 2 Score 2 0 - 0  PHQ- 9 Score 4 1 - -  Exception Documentation - - Patient refusal -      Assessment & Plan:     Routine Health Maintenance and Physical Exam  Exercise Activities and Dietary recommendations Goals    None      Immunization History  Administered Date(s) Administered  . Influenza Split 02/13/2009, 01/22/2010, 01/14/2011  . Influenza,inj,Quad PF,6+ Mos 01/20/2013, 03/09/2014, 01/05/2017  . Influenza-Unspecified 03/01/2015  . Td 12/02/2016  . Tdap 09/03/2005    Health Maintenance  Topic Date Due  . COLONOSCOPY  04/09/2007  . COLON CANCER SCREENING ANNUAL FOBT  03/17/2017  . MAMMOGRAM  06/02/2017  . INFLUENZA VACCINE  12/02/2017  . PAP SMEAR  01/06/2020  . TETANUS/TDAP  12/03/2026  . Hepatitis C Screening  Completed  . HIV Screening  Completed     Discussed health benefits of physical activity, and encouraged her to engage in regular exercise appropriate for her age and condition.    1. Annual physical exam Normal physical exam today. Will check labs as below and f/u pending lab results. If labs are stable and  WNL she will not need to have these rechecked for one year at her next annual physical exam. She is to call the  office in the meantime if she has any acute issue, questions or concerns.  2. Breast cancer screening Breast exam today was normal. There is no family history of breast cancer. She does perform regular self breast exams. Mammogram was ordered as below. Information for [facility] was given to patient so she may schedule her mammogram at her convenience. - MM Digital Diagnostic Bilat; Future  3. Benign essential HTN Stable. Continue Lisinopril 40mg , HCTZ 12.5mg . Will check labs as below and f/u pending results. - Lipid panel - TSH - CBC w/Diff/Platelet - Basic Metabolic Panel (BMET)  4. Chronic kidney disease (CKD), stage II (mild) Stable. Will check labs as below and f/u pending results. - Lipid panel - Basic Metabolic Panel (BMET)  5. Bipolar disorder in full remission, most recent episode unspecified type ([place]) Stable. Continue Venlafaxine 75mg  TID, Zyprexa 15mg  daily and buspar 15mg  BID.  - TSH  6. Blood glucose elevated H/O this. Diet controlled. Will check labs as below and f/u pending results. - Hemoglobin T7G - Basic Metabolic Panel (BMET)  7. Decreased potassium in the blood H/O this. On supplementation. Will check labs as below and f/u pending results. - Basic Metabolic Panel (BMET)  8. Hypercholesterolemia without hypertriglyceridemia Stable. Continue lovastatin 40mg . Will check labs as below and f/u pending results. - Lipid panel - Basic Metabolic Panel (BMET)  9. Avitaminosis D H/o this and postmenopausal. Will check labs as below and f/u pending results. - CBC w/Diff/Platelet - Vitamin D (25 hydroxy)  10. Primary insomnia Stable. Diagnosis pulled for medication refill. Continue current medical treatment plan. - zolpidem (AMBIEN) 10 MG tablet; Take 1 tablet (10 mg total) by mouth at bedtime as needed for sleep.  Dispense: 90 tablet; Refill:  1  --------------------------------------------------------------------    Mar Daring, PA-C  [facility]

## 2017-10-06 NOTE — Addendum Note (Signed)
Addended by: Doristine Devoid on: 10/06/2017 10:10 AM   Modules accepted: Orders

## 2017-10-06 NOTE — Patient Instructions (Signed)
Health Maintenance for Postmenopausal Women Menopause is a normal process in which your reproductive ability comes to an end. This process happens gradually over a span of months to years, usually between the ages of 22 and 9. Menopause is complete when you have missed 12 consecutive menstrual periods. It is important to talk with your health care provider about some of the most common conditions that affect postmenopausal women, such as heart disease, cancer, and bone loss (osteoporosis). Adopting a healthy lifestyle and getting preventive care can help to promote your health and wellness. Those actions can also lower your chances of developing some of these common conditions. What should I know about menopause? During menopause, you may experience a number of symptoms, such as:  Moderate-to-severe hot flashes.  Night sweats.  Decrease in sex drive.  Mood swings.  Headaches.  Tiredness.  Irritability.  Memory problems.  Insomnia.  Choosing to treat or not to treat menopausal changes is an individual decision that you make with your health care provider. What should I know about hormone replacement therapy and supplements? Hormone therapy products are effective for treating symptoms that are associated with menopause, such as hot flashes and night sweats. Hormone replacement carries certain risks, especially as you become older. If you are thinking about using estrogen or estrogen with progestin treatments, discuss the benefits and risks with your health care provider. What should I know about heart disease and stroke? Heart disease, heart attack, and stroke become more likely as you age. This may be due, in part, to the hormonal changes that your body experiences during menopause. These can affect how your body processes dietary fats, triglycerides, and cholesterol. Heart attack and stroke are both medical emergencies. There are many things that you can do to help prevent heart disease  and stroke:  Have your blood pressure checked at least every 1-2 years. High blood pressure causes heart disease and increases the risk of stroke.  If you are 53-22 years old, ask your health care provider if you should take aspirin to prevent a heart attack or a stroke.  Do not use any tobacco products, including cigarettes, chewing tobacco, or electronic cigarettes. If you need help quitting, ask your health care provider.  It is important to eat a healthy diet and maintain a healthy weight. ? Be sure to include plenty of vegetables, fruits, low-fat dairy products, and lean protein. ? Avoid eating foods that are high in solid fats, added sugars, or salt (sodium).  Get regular exercise. This is one of the most important things that you can do for your health. ? Try to exercise for at least 150 minutes each week. The type of exercise that you do should increase your heart rate and make you sweat. This is known as moderate-intensity exercise. ? Try to do strengthening exercises at least twice each week. Do these in addition to the moderate-intensity exercise.  Know your numbers.Ask your health care provider to check your cholesterol and your blood glucose. Continue to have your blood tested as directed by your health care provider.  What should I know about cancer screening? There are several types of cancer. Take the following steps to reduce your risk and to catch any cancer development as early as possible. Breast Cancer  Practice breast self-awareness. ? This means understanding how your breasts normally appear and feel. ? It also means doing regular breast self-exams. Let your health care provider know about any changes, no matter how small.  If you are 40  or older, have a clinician do a breast exam (clinical breast exam or CBE) every year. Depending on your age, family history, and medical history, it may be recommended that you also have a yearly breast X-ray (mammogram).  If you  have a family history of breast cancer, talk with your health care provider about genetic screening.  If you are at high risk for breast cancer, talk with your health care provider about having an MRI and a mammogram every year.  Breast cancer (BRCA) gene test is recommended for women who have family members with BRCA-related cancers. Results of the assessment will determine the need for genetic counseling and BRCA1 and for BRCA2 testing. BRCA-related cancers include these types: ? Breast. This occurs in males or females. ? Ovarian. ? Tubal. This may also be called fallopian tube cancer. ? Cancer of the abdominal or pelvic lining (peritoneal cancer). ? Prostate. ? Pancreatic.  Cervical, Uterine, and Ovarian Cancer Your health care provider may recommend that you be screened regularly for cancer of the pelvic organs. These include your ovaries, uterus, and vagina. This screening involves a pelvic exam, which includes checking for microscopic changes to the surface of your cervix (Pap test).  For women ages 21-65, health care providers may recommend a pelvic exam and a Pap test every three years. For women ages 79-65, they may recommend the Pap test and pelvic exam, combined with testing for human papilloma virus (HPV), every five years. Some types of HPV increase your risk of cervical cancer. Testing for HPV may also be done on women of any age who have unclear Pap test results.  Other health care providers may not recommend any screening for nonpregnant women who are considered low risk for pelvic cancer and have no symptoms. Ask your health care provider if a screening pelvic exam is right for you.  If you have had past treatment for cervical cancer or a condition that could lead to cancer, you need Pap tests and screening for cancer for at least 20 years after your treatment. If Pap tests have been discontinued for you, your risk factors (such as having a new sexual partner) need to be  reassessed to determine if you should start having screenings again. Some women have medical problems that increase the chance of getting cervical cancer. In these cases, your health care provider may recommend that you have screening and Pap tests more often.  If you have a family history of uterine cancer or ovarian cancer, talk with your health care provider about genetic screening.  If you have vaginal bleeding after reaching menopause, tell your health care provider.  There are currently no reliable tests available to screen for ovarian cancer.  Lung Cancer Lung cancer screening is recommended for adults 69-62 years old who are at high risk for lung cancer because of a history of smoking. A yearly low-dose CT scan of the lungs is recommended if you:  Currently smoke.  Have a history of at least 30 pack-years of smoking and you currently smoke or have quit within the past 15 years. A pack-year is smoking an average of one pack of cigarettes per day for one year.  Yearly screening should:  Continue until it has been 15 years since you quit.  Stop if you develop a health problem that would prevent you from having lung cancer treatment.  Colorectal Cancer  This type of cancer can be detected and can often be prevented.  Routine colorectal cancer screening usually begins at  age 42 and continues through age 45.  If you have risk factors for colon cancer, your health care provider may recommend that you be screened at an earlier age.  If you have a family history of colorectal cancer, talk with your health care provider about genetic screening.  Your health care provider may also recommend using home test kits to check for hidden blood in your stool.  A small camera at the end of a tube can be used to examine your colon directly (sigmoidoscopy or colonoscopy). This is done to check for the earliest forms of colorectal cancer.  Direct examination of the colon should be repeated every  5-10 years until age 71. However, if early forms of precancerous polyps or small growths are found or if you have a family history or genetic risk for colorectal cancer, you may need to be screened more often.  Skin Cancer  Check your skin from head to toe regularly.  Monitor any moles. Be sure to tell your health care provider: ? About any new moles or changes in moles, especially if there is a change in a mole's shape or color. ? If you have a mole that is larger than the size of a pencil eraser.  If any of your family members has a history of skin cancer, especially at a young age, talk with your health care provider about genetic screening.  Always use sunscreen. Apply sunscreen liberally and repeatedly throughout the day.  Whenever you are outside, protect yourself by wearing long sleeves, pants, a wide-brimmed hat, and sunglasses.  What should I know about osteoporosis? Osteoporosis is a condition in which bone destruction happens more quickly than new bone creation. After menopause, you may be at an increased risk for osteoporosis. To help prevent osteoporosis or the bone fractures that can happen because of osteoporosis, the following is recommended:  If you are 46-71 years old, get at least 1,000 mg of calcium and at least 600 mg of vitamin D per day.  If you are older than age 55 but younger than age 65, get at least 1,200 mg of calcium and at least 600 mg of vitamin D per day.  If you are older than age 54, get at least 1,200 mg of calcium and at least 800 mg of vitamin D per day.  Smoking and excessive alcohol intake increase the risk of osteoporosis. Eat foods that are rich in calcium and vitamin D, and do weight-bearing exercises several times each week as directed by your health care provider. What should I know about how menopause affects my mental health? Depression may occur at any age, but it is more common as you become older. Common symptoms of depression  include:  Low or sad mood.  Changes in sleep patterns.  Changes in appetite or eating patterns.  Feeling an overall lack of motivation or enjoyment of activities that you previously enjoyed.  Frequent crying spells.  Talk with your health care provider if you think that you are experiencing depression. What should I know about immunizations? It is important that you get and maintain your immunizations. These include:  Tetanus, diphtheria, and pertussis (Tdap) booster vaccine.  Influenza every year before the flu season begins.  Pneumonia vaccine.  Shingles vaccine.  Your health care provider may also recommend other immunizations. This information is not intended to replace advice given to you by your health care provider. Make sure you discuss any questions you have with your health care provider. Document Released: 06/12/2005  Document Revised: 11/08/2015 Document Reviewed: 01/22/2015 Elsevier Interactive Patient Education  2018 [employer]  

## 2017-10-08 ENCOUNTER — Telehealth: Payer: Self-pay

## 2017-10-08 LAB — BASIC METABOLIC PANEL
BUN / CREAT RATIO: 10 (calc) (ref 6–22)
BUN: 11 mg/dL (ref 7–25)
CHLORIDE: 104 mmol/L (ref 98–110)
CO2: 29 mmol/L (ref 20–32)
CREATININE: 1.08 mg/dL — AB (ref 0.50–0.99)
Calcium: 10.5 mg/dL — ABNORMAL HIGH (ref 8.6–10.4)
Glucose, Bld: 95 mg/dL (ref 65–99)
POTASSIUM: 3.2 mmol/L — AB (ref 3.5–5.3)
SODIUM: 143 mmol/L (ref 135–146)

## 2017-10-08 LAB — CBC WITH DIFFERENTIAL/PLATELET
BASOS PCT: 0 %
Basophils Absolute: 0 cells/uL (ref 0–200)
EOS PCT: 3.5 %
Eosinophils Absolute: 252 cells/uL (ref 15–500)
HEMATOCRIT: 38.2 % (ref 35.0–45.0)
HEMOGLOBIN: 13.5 g/dL (ref 11.7–15.5)
LYMPHS ABS: 2419 {cells}/uL (ref 850–3900)
MCH: 30.2 pg (ref 27.0–33.0)
MCHC: 35.3 g/dL (ref 32.0–36.0)
MCV: 85.5 fL (ref 80.0–100.0)
MPV: 9 fL (ref 7.5–12.5)
Monocytes Relative: 5.4 %
NEUTROS ABS: 4140 {cells}/uL (ref 1500–7800)
Neutrophils Relative %: 57.5 %
Platelets: 273 10*3/uL (ref 140–400)
RBC: 4.47 10*6/uL (ref 3.80–5.10)
RDW: 13.4 % (ref 11.0–15.0)
Total Lymphocyte: 33.6 %
WBC: 7.2 10*3/uL (ref 3.8–10.8)
WBCMIX: 389 {cells}/uL (ref 200–950)

## 2017-10-08 LAB — LIPID PANEL
CHOLESTEROL: 195 mg/dL (ref <200)
HDL: 48 mg/dL — ABNORMAL LOW (ref >50)
LDL CHOLESTEROL (CALC): 114 mg/dL — AB
Non-HDL Cholesterol (Calc): 147 mg/dL (calc) — ABNORMAL HIGH (ref <130)
Total CHOL/HDL Ratio: 4.1 (calc) (ref <5.0)
Triglycerides: 213 mg/dL — ABNORMAL HIGH (ref <150)

## 2017-10-08 LAB — HEMOGLOBIN A1C
HEMOGLOBIN A1C: 5 %{Hb} (ref <5.7)
MEAN PLASMA GLUCOSE: 97 (calc)
eAG (mmol/L): 5.4 (calc)

## 2017-10-08 LAB — VITAMIN D 25 HYDROXY (VIT D DEFICIENCY, FRACTURES): VIT D 25 HYDROXY: 41 ng/mL (ref 30–100)

## 2017-10-08 LAB — TSH: TSH: 1.88 mIU/L (ref 0.40–4.50)

## 2017-10-08 NOTE — Telephone Encounter (Signed)
-----   Message from Mar Daring, PA-C sent at 10/08/2017 10:43 AM EDT ----- All labs are within normal limits and/or stable from last check.  I will complete form and can give to [facility] today. Thanks! -JB

## 2017-10-08 NOTE — Telephone Encounter (Signed)
Patient advised as directed below in person.  Thanks,  -Joseline

## 2017-10-11 ENCOUNTER — Other Ambulatory Visit: Payer: Self-pay | Admitting: Physician Assistant

## 2017-10-11 DIAGNOSIS — N63 Unspecified lump in unspecified breast: Secondary | ICD-10-CM

## 2017-10-14 ENCOUNTER — Telehealth: Payer: Self-pay | Admitting: Family Medicine

## 2017-10-15 ENCOUNTER — Other Ambulatory Visit: Payer: Self-pay | Admitting: Physician Assistant

## 2017-10-15 DIAGNOSIS — N63 Unspecified lump in unspecified breast: Secondary | ICD-10-CM

## 2017-11-03 ENCOUNTER — Inpatient Hospital Stay: Admission: RE | Admit: 2017-11-03 | Payer: 59 | Source: Ambulatory Visit

## 2017-11-03 ENCOUNTER — Other Ambulatory Visit: Payer: 59

## 2017-11-09 ENCOUNTER — Telehealth: Payer: Self-pay | Admitting: Family Medicine

## 2017-11-09 DIAGNOSIS — M1029 Drug-induced gout, multiple sites: Secondary | ICD-10-CM

## 2017-11-09 MED ORDER — COLCHICINE 0.6 MG PO TABS: ORAL_TABLET | ORAL | 1 refills | Status: DC

## 2017-11-09 NOTE — Telephone Encounter (Signed)
Pt wants to know if something can be called in for her gout  She uses [facility] @ [phone]  Thanks Con Memos

## 2017-11-09 NOTE — Telephone Encounter (Signed)
Patient advised as below.  

## 2017-11-09 NOTE — Telephone Encounter (Signed)
Sent in [place]

## 2017-11-15 ENCOUNTER — Telehealth: Payer: Self-pay | Admitting: Family Medicine

## 2017-11-15 DIAGNOSIS — M1039 Gout due to renal impairment, multiple sites: Secondary | ICD-10-CM

## 2017-11-15 NOTE — Telephone Encounter (Signed)
Pt states she has gout in her jowl.  She has had in once before.  She thinks she needs something stronger than what she is currently taking for gout  She uses [facility] # [phone]  Thanks Con Memos

## 2017-11-15 NOTE — Telephone Encounter (Signed)
Dr Rosanna Randy, you have actually never seen this patient, or at least not in [place].  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  Tawanna Sat has been seeing her.  Should we change her PCP to [facility] and let her handle this request?

## 2017-11-15 NOTE — Telephone Encounter (Signed)
Stephanie Ball if she will.

## 2017-11-16 MED ORDER — PREDNISONE 10 MG PO TABS: ORAL_TABLET | ORAL | 0 refills | Status: DC

## 2017-11-16 NOTE — Telephone Encounter (Signed)
Tawanna Sat, This patient has actually never seen Dr Rosanna Randy.  Can you do this?

## 2017-11-16 NOTE — Telephone Encounter (Signed)
Sent in prednisone. If it does not respond she needs to be seen as it may not be gout.

## 2017-11-17 NOTE — Telephone Encounter (Signed)
Patient advised as directed below.  Thanks,  -Weslyn Holsonback 

## 2017-11-21 ENCOUNTER — Other Ambulatory Visit: Payer: Self-pay | Admitting: Physician Assistant

## 2017-11-21 DIAGNOSIS — E559 Vitamin D deficiency, unspecified: Secondary | ICD-10-CM

## 2017-11-29 ENCOUNTER — Telehealth: Payer: Self-pay

## 2017-11-29 ENCOUNTER — Other Ambulatory Visit: Payer: Self-pay | Admitting: Physician Assistant

## 2017-11-29 ENCOUNTER — Ambulatory Visit
Admission: RE | Admit: 2017-11-29 | Discharge: 2017-11-29 | Disposition: A | Discharge status: Home / Self Care | Payer: 59 | Source: Ambulatory Visit | Attending: Physician Assistant | Admitting: Physician Assistant

## 2017-11-29 ENCOUNTER — Ambulatory Visit: Payer: Self-pay | Admitting: Physician Assistant

## 2017-11-29 DIAGNOSIS — N63 Unspecified lump in unspecified breast: Secondary | ICD-10-CM | POA: Diagnosis present

## 2017-11-29 DIAGNOSIS — M1029 Drug-induced gout, multiple sites: Secondary | ICD-10-CM

## 2017-11-29 NOTE — Telephone Encounter (Signed)
Patient advised as directed below.  Thanks,  -Trinitee Horgan 

## 2017-11-29 NOTE — Telephone Encounter (Signed)
-----   Message from Mar Daring, [place] sent at 11/29/2017  2:16 PM EDT ----- Stable benign appearing bilateral breast nodules. Screening mammogram in one year.

## 2017-12-07 ENCOUNTER — Other Ambulatory Visit: Payer: Self-pay | Admitting: Physician Assistant

## 2017-12-07 DIAGNOSIS — M1029 Drug-induced gout, multiple sites: Secondary | ICD-10-CM

## 2017-12-07 MED ORDER — COLCHICINE 0.6 MG PO TABS: ORAL_TABLET | ORAL | 1 refills | Status: DC

## 2017-12-07 NOTE — Addendum Note (Signed)
Addended by: Mar Daring on: 12/07/2017 01:07 PM   Modules accepted: Orders

## 2017-12-08 ENCOUNTER — Encounter: Payer: Self-pay | Admitting: Physician Assistant

## 2017-12-08 ENCOUNTER — Ambulatory Visit: Payer: 59 | Admitting: Physician Assistant

## 2017-12-08 VITALS — BP 140/88 | HR 92 | Temp 98.5°F | Resp 16 | Wt 170.6 lb

## 2017-12-08 DIAGNOSIS — H66001 Acute suppurative otitis media without spontaneous rupture of ear drum, right ear: Secondary | ICD-10-CM

## 2017-12-08 MED ORDER — DOXYCYCLINE HYCLATE 100 MG PO TABS: 100.0000 mg | ORAL_TABLET | Freq: Two times a day (BID) | ORAL | 0 refills | Status: DC

## 2017-12-08 NOTE — Patient Instructions (Signed)

## 2017-12-08 NOTE — Progress Notes (Signed)
Patient: Stephanie Ball Female    DOB: [birth year]   62 y.o.   MRN: [id number] Visit Date: 12/08/2017  Today's Provider: Mar Daring, PA-C   Chief Complaint  Patient presents with  . Cough   Subjective:    Cough  This is a new problem. The current episode started yesterday. The problem has been gradually worsening. The problem occurs constantly. The cough is non-productive. Associated symptoms include ear congestion ("sore" right ear), ear pain, nasal congestion, a sore throat ("since Saturday") and shortness of breath. Pertinent negatives include no chest pain, fever, headaches, postnasal drip, rhinorrhea or wheezing. Nothing aggravates the symptoms. Treatments tried: Tylenol. The treatment provided no relief.      Allergies  Allergen Reactions  . Aciphex  [Rabeprazole Sodium]   . Amoxicillin-Pot Clavulanate   . Iron   . Macrobid  [employer] Macro]   . Metoprolol Other (See Comments)    After taking medication for about 72 hours she noted elevated systolic BP and swelling of hands and feet and discontinued.     Current Outpatient Medications:  .  aspirin 81 MG tablet, Take 2 tablets by mouth at bedtime., Disp: , Rfl:  .  busPIRone (BUSPAR) 15 MG tablet, Take 1 tablet (15 mg total) 2 (two) times daily by mouth., Disp: 180 tablet, Rfl: 3 .  clotrimazole-betamethasone (LOTRISONE) cream, APPLY TOPICALLY 2 TIMES DAILY, Disp: 30 g, Rfl: 5 .  colchicine 0.6 MG tablet, Take 2 tabs PO at onset of symptoms, and one tab PO daily until symptoms resolve. Repeat as needed, Disp: 90 tablet, Rfl: 1 .  hydrochlorothiazide (HYDRODIURIL) 12.5 MG tablet, Take 1 tablet (12.5 mg total) by mouth daily., Disp: 90 tablet, Rfl: 3 .  lisinopril (PRINIVIL,ZESTRIL) 40 MG tablet, Take 1 tablet (40 mg total) by mouth daily., Disp: 90 tablet, Rfl: 3 .  lovastatin (MEVACOR) 40 MG tablet, Take 1 tablet (40 mg total) by mouth at bedtime., Disp: 90 tablet, Rfl: 3 .  montelukast  (SINGULAIR) 10 MG tablet, Take 1 tablet (10 mg total) by mouth daily., Disp: 90 tablet, Rfl: 3 .  OLANZapine (ZYPREXA) 15 MG tablet, Take 1 tablet (15 mg total) by mouth daily., Disp: 90 tablet, Rfl: 3 .  traZODone (DESYREL) 100 MG tablet, Take 1 tablet (100 mg total) by mouth at bedtime as needed for sleep., Disp: 90 tablet, Rfl: 3 .  venlafaxine (EFFEXOR) 75 MG tablet, Take 1 tablet (75 mg total) by mouth 3 (three) times daily with meals., Disp: 270 tablet, Rfl: 3 .  Vitamin D, Ergocalciferol, (DRISDOL) 50000 units CAPS capsule, TAKE 1 CAPSULE (50,000 UNITS TOTAL) BY MOUTH EVERY 7 (SEVEN) DAYS., Disp: 12 capsule, Rfl: 3 .  zolpidem (AMBIEN) 10 MG tablet, Take 1 tablet (10 mg total) by mouth at bedtime as needed for sleep., Disp: 90 tablet, Rfl: 1 .  azithromycin (ZITHROMAX) 250 MG tablet, Take 2 tablets PO on day one, and one tablet PO daily thereafter until completed. (Patient not taking: Reported on 12/08/2017), Disp: 6 tablet, Rfl: 0 .  predniSONE (DELTASONE) 10 MG tablet, Take 6 tabs PO on day 1&2, 5 tabs PO on day 3&4, 4 tabs PO on day 5&6, 3 tabs PO on day 7&8, 2 tabs PO on day 9&10, 1 tab PO on day 11&12. (Patient not taking: Reported on 12/08/2017), Disp: 42 tablet, Rfl: 0  Review of Systems  Constitutional: Negative for fever.  HENT: Positive for congestion, ear pain and sore throat ("since Saturday"). Negative for  postnasal drip, rhinorrhea, sinus pressure, sinus pain and sneezing.   Respiratory: Positive for cough, chest tightness and shortness of breath. Negative for wheezing.   Cardiovascular: Negative for chest pain, palpitations and leg swelling.  Neurological: Negative for headaches.    Social History   Tobacco Use  . Smoking status: Current Every Day Smoker  . Smokeless tobacco: Never Used  . Tobacco comment: would like to quit; as stated pt smoked 1/2 PPd for 25 years and now is smoking 5-6 cigarettes per day.  Substance Use Topics  . Alcohol use: No   Objective:   BP  140/88 (BP Location: Left Arm, Patient Position: Sitting, Cuff Size: Normal)   Pulse 92   Temp 98.5 F (36.9 C) (Oral)   Resp 16   Wt 170 lb 9.6 oz (77.4 kg)   SpO2 97%   BMI 30.22 kg/m  Vitals:   12/08/17 0836  BP: 140/88  Pulse: 92  Resp: 16  Temp: 98.5 F (36.9 C)  TempSrc: Oral  SpO2: 97%  Weight: 170 lb 9.6 oz (77.4 kg)     Physical Exam  Constitutional: She appears well-developed and well-nourished. No distress.  HENT:  Head: Normocephalic and atraumatic.  Right Ear: Hearing, external ear and ear canal normal. There is swelling and tenderness. Tympanic membrane is erythematous and bulging. Tympanic membrane is not perforated. A middle ear effusion is present.  Left Ear: Hearing, tympanic membrane, external ear and ear canal normal.  Nose: Nose normal. Right sinus exhibits no maxillary sinus tenderness and no frontal sinus tenderness. Left sinus exhibits no maxillary sinus tenderness and no frontal sinus tenderness.  Mouth/Throat: Uvula is midline, oropharynx is clear and moist and mucous membranes are normal. No oropharyngeal exudate.  Eyes: Pupils are equal, round, and reactive to light. Conjunctivae are normal. Right eye exhibits no discharge. Left eye exhibits no discharge. No scleral icterus.  Neck: Normal range of motion. Neck supple. No tracheal deviation present. No thyromegaly present.  Cardiovascular: Normal rate, regular rhythm and normal heart sounds. Exam reveals no gallop and no friction rub.  No murmur heard. Pulmonary/Chest: Effort normal and breath sounds normal. No stridor. No respiratory distress. She has no wheezes. She has no rales.  Lymphadenopathy:    She has no cervical adenopathy.  Skin: Skin is warm and dry. She is not diaphoretic.  Vitals reviewed.      Assessment & Plan:     1. Acute suppurative otitis media of right ear without spontaneous rupture of tympanic membrane, recurrence not specified Worsening symptoms that have not responded to  OTC medications. Will give Doxycycline as below. Continue allergy medications. Stay well hydrated and get plenty of rest. Call if no symptom improvement or if symptoms worsen. - doxycycline (VIBRA-TABS) 100 MG tablet; Take 1 tablet (100 mg total) by mouth 2 (two) times daily.  Dispense: 14 tablet; Refill: 0       Mar Daring, PA-C  [facility]

## 2017-12-09 ENCOUNTER — Other Ambulatory Visit: Payer: Self-pay

## 2017-12-09 DIAGNOSIS — F5101 Primary insomnia: Secondary | ICD-10-CM

## 2017-12-09 MED ORDER — TRAZODONE HCL 100 MG PO TABS: 100.0000 mg | ORAL_TABLET | Freq: Every evening | ORAL | 3 refills | Status: DC | PRN

## 2018-01-10 ENCOUNTER — Ambulatory Visit (INDEPENDENT_AMBULATORY_CARE_PROVIDER_SITE_OTHER): Payer: 59 | Admitting: Physician Assistant

## 2018-01-10 ENCOUNTER — Encounter: Payer: Self-pay | Admitting: Physician Assistant

## 2018-01-10 VITALS — BP 148/72 | HR 62 | Temp 98.4°F | Resp 16 | Wt 171.2 lb

## 2018-01-10 DIAGNOSIS — J0100 Acute maxillary sinusitis, unspecified: Secondary | ICD-10-CM

## 2018-01-10 DIAGNOSIS — R05 Cough: Secondary | ICD-10-CM | POA: Diagnosis not present

## 2018-01-10 DIAGNOSIS — H66001 Acute suppurative otitis media without spontaneous rupture of ear drum, right ear: Secondary | ICD-10-CM

## 2018-01-10 DIAGNOSIS — R059 Cough, unspecified: Secondary | ICD-10-CM

## 2018-01-10 DIAGNOSIS — I10 Essential (primary) hypertension: Secondary | ICD-10-CM | POA: Diagnosis not present

## 2018-01-10 MED ORDER — CEFDINIR 300 MG PO CAPS: 300.0000 mg | ORAL_CAPSULE | Freq: Two times a day (BID) | ORAL | 0 refills | Status: DC

## 2018-01-10 MED ORDER — LISINOPRIL 40 MG PO TABS: 40.0000 mg | ORAL_TABLET | Freq: Every day | ORAL | 3 refills | Status: DC

## 2018-01-10 MED ORDER — HYDROCODONE-HOMATROPINE 5-1.5 MG/5ML PO SYRP: 5.0000 mL | ORAL_SOLUTION | Freq: Three times a day (TID) | ORAL | 0 refills | Status: DC | PRN

## 2018-01-10 MED ORDER — HYDROCHLOROTHIAZIDE 12.5 MG PO TABS: 12.5000 mg | ORAL_TABLET | Freq: Every day | ORAL | 3 refills | Status: DC

## 2018-01-10 NOTE — Progress Notes (Signed)
Patient: Stephanie Ball Female    DOB: 12-12-1955   62 y.o.   MRN: [id number] Visit Date: 01/10/2018  Today's Provider: Mar Daring, PA-C   No chief complaint on file.  Subjective:    URI   This is a new problem. The current episode started 1 to 4 weeks ago. The problem has been gradually worsening. There has been no fever. Associated symptoms include congestion, coughing, ear pain (Right ear pain), a plugged ear sensation, rhinorrhea and a sore throat. Pertinent negatives include no chest pain, headaches, sinus pain or wheezing. She has tried decongestant (Tylenol Sinus) for the symptoms. The treatment provided no relief.      Allergies  Allergen Reactions  . Aciphex  [Rabeprazole Sodium]   . Amoxicillin-Pot Clavulanate   . Iron   . Macrobid  [employer] Macro]   . Metoprolol Other (See Comments)    After taking medication for about 72 hours she noted elevated systolic BP and swelling of hands and feet and discontinued.     Current Outpatient Medications:  .  aspirin 81 MG tablet, Take 2 tablets by mouth at bedtime., Disp: , Rfl:  .  busPIRone (BUSPAR) 15 MG tablet, Take 1 tablet (15 mg total) 2 (two) times daily by mouth., Disp: 180 tablet, Rfl: 3 .  clotrimazole-betamethasone (LOTRISONE) cream, APPLY TOPICALLY 2 TIMES DAILY, Disp: 30 g, Rfl: 5 .  colchicine 0.6 MG tablet, Take 2 tabs PO at onset of symptoms, and one tab PO daily until symptoms resolve. Repeat as needed, Disp: 90 tablet, Rfl: 1 .  hydrochlorothiazide (HYDRODIURIL) 12.5 MG tablet, Take 1 tablet (12.5 mg total) by mouth daily., Disp: 90 tablet, Rfl: 3 .  lisinopril (PRINIVIL,ZESTRIL) 40 MG tablet, Take 1 tablet (40 mg total) by mouth daily., Disp: 90 tablet, Rfl: 3 .  lovastatin (MEVACOR) 40 MG tablet, Take 1 tablet (40 mg total) by mouth at bedtime., Disp: 90 tablet, Rfl: 3 .  montelukast (SINGULAIR) 10 MG tablet, Take 1 tablet (10 mg total) by mouth daily., Disp: 90 tablet, Rfl: 3 .   OLANZapine (ZYPREXA) 15 MG tablet, Take 1 tablet (15 mg total) by mouth daily., Disp: 90 tablet, Rfl: 3 .  traZODone (DESYREL) 100 MG tablet, Take 1 tablet (100 mg total) by mouth at bedtime as needed for sleep., Disp: 90 tablet, Rfl: 3 .  venlafaxine (EFFEXOR) 75 MG tablet, Take 1 tablet (75 mg total) by mouth 3 (three) times daily with meals., Disp: 270 tablet, Rfl: 3 .  Vitamin D, Ergocalciferol, (DRISDOL) 50000 units CAPS capsule, TAKE 1 CAPSULE (50,000 UNITS TOTAL) BY MOUTH EVERY 7 (SEVEN) DAYS., Disp: 12 capsule, Rfl: 3 .  zolpidem (AMBIEN) 10 MG tablet, Take 1 tablet (10 mg total) by mouth at bedtime as needed for sleep., Disp: 90 tablet, Rfl: 1 .  azithromycin (ZITHROMAX) 250 MG tablet, Take 2 tablets PO on day one, and one tablet PO daily thereafter until completed. (Patient not taking: Reported on 12/08/2017), Disp: 6 tablet, Rfl: 0 .  doxycycline (VIBRA-TABS) 100 MG tablet, Take 1 tablet (100 mg total) by mouth 2 (two) times daily. (Patient not taking: Reported on 01/10/2018), Disp: 14 tablet, Rfl: 0 .  predniSONE (DELTASONE) 10 MG tablet, Take 6 tabs PO on day 1&2, 5 tabs PO on day 3&4, 4 tabs PO on day 5&6, 3 tabs PO on day 7&8, 2 tabs PO on day 9&10, 1 tab PO on day 11&12. (Patient not taking: Reported on 12/08/2017), Disp: 42 tablet,  Rfl: 0  Review of Systems  Constitutional: Positive for fatigue. Negative for chills and fever.  HENT: Positive for congestion, ear pain (Right ear pain), postnasal drip, rhinorrhea, sore throat and voice change. Negative for sinus pressure and sinus pain.   Respiratory: Positive for cough, chest tightness and shortness of breath. Negative for wheezing.   Cardiovascular: Negative for chest pain, palpitations and leg swelling.  Neurological: Positive for light-headedness. Negative for headaches.    Social History   Tobacco Use  . Smoking status: Current Every Day Smoker  . Smokeless tobacco: Never Used  . Tobacco comment: would like to quit; as stated pt  smoked 1/2 PPd for 25 years and now is smoking 5-6 cigarettes per day.  Substance Use Topics  . Alcohol use: No   Objective:   BP (!) 148/72 (BP Location: Left Arm, Patient Position: Sitting, Cuff Size: Normal)   Pulse 62   Temp 98.4 F (36.9 C) (Oral)   Resp 16   Wt 171 lb 3.2 oz (77.7 kg)   SpO2 97%   BMI 30.33 kg/m  Vitals:   01/10/18 0951  BP: (!) 148/72  Pulse: 62  Resp: 16  Temp: 98.4 F (36.9 C)  TempSrc: Oral  SpO2: 97%  Weight: 171 lb 3.2 oz (77.7 kg)     Physical Exam  Constitutional: She appears well-developed and well-nourished. No distress.  HENT:  Head: Normocephalic and atraumatic.  Right Ear: Hearing, external ear and ear canal normal. Tympanic membrane is erythematous and bulging. Tympanic membrane is not perforated. A middle ear effusion is present.  Left Ear: Hearing, tympanic membrane, external ear and ear canal normal.  Nose: Right sinus exhibits maxillary sinus tenderness and frontal sinus tenderness. Left sinus exhibits maxillary sinus tenderness and frontal sinus tenderness.  Mouth/Throat: Uvula is midline, oropharynx is clear and moist and mucous membranes are normal. No oropharyngeal exudate.  Neck: Normal range of motion. Neck supple. No tracheal deviation present. No thyromegaly present.  Cardiovascular: Normal rate, regular rhythm and normal heart sounds. Exam reveals no gallop and no friction rub.  No murmur heard. Pulmonary/Chest: Effort normal and breath sounds normal. No stridor. No respiratory distress. She has no wheezes. She has no rales.  Lymphadenopathy:    She has no cervical adenopathy.  Skin: She is not diaphoretic.  Vitals reviewed.       Assessment & Plan:     1. Non-recurrent acute suppurative otitis media of right ear without spontaneous rupture of tympanic membrane Worsening symptoms that have not responded to OTC medications. Will give Cefdinir as below. Continue allergy medications. Stay well hydrated and get plenty of  rest. Call if no symptom improvement or if symptoms worsen. - cefdinir (OMNICEF) 300 MG capsule; Take 1 capsule (300 mg total) by mouth 2 (two) times daily.  Dispense: 20 capsule; Refill: 0  2. Acute non-recurrent maxillary sinusitis See above medical treatment plan. - cefdinir (OMNICEF) 300 MG capsule; Take 1 capsule (300 mg total) by mouth 2 (two) times daily.  Dispense: 20 capsule; Refill: 0  3. Benign essential HTN Stable. Diagnosis pulled for medication refill. Continue current medical treatment plan. - lisinopril (PRINIVIL,ZESTRIL) 40 MG tablet; Take 1 tablet (40 mg total) by mouth daily.  Dispense: 90 tablet; Refill: 3 - hydrochlorothiazide (HYDRODIURIL) 12.5 MG tablet; Take 1 tablet (12.5 mg total) by mouth daily.  Dispense: 90 tablet; Refill: 3  4. Cough Worsening symptoms that has not responded to OTC medications. Will give Hycodan cough syrup as below for nighttime cough.  Drowsiness precautions given to patient. Stay well hydrated. Use delsym, robitussin OR mucinex for daytime cough. - HYDROcodone-homatropine (HYCODAN) 5-1.5 MG/5ML syrup; Take 5 mLs by mouth every 8 (eight) hours as needed for cough.  Dispense: 120 mL; Refill: 0       Mar Daring, PA-C  [facility]

## 2018-01-14 ENCOUNTER — Telehealth: Payer: Self-pay | Admitting: Family Medicine

## 2018-01-14 NOTE — Telephone Encounter (Signed)
Pt's husband calling stating his wife came in to see Tawanna Sat on Monday for sinus infection and was told to call back at the end of the week if she wasn't better so she still is having same problems as she did but maybe a little better but not much. Please advise pt on what to do. Thanks CC

## 2018-01-17 ENCOUNTER — Telehealth: Payer: Self-pay | Admitting: Family Medicine

## 2018-01-17 DIAGNOSIS — R05 Cough: Secondary | ICD-10-CM

## 2018-01-17 DIAGNOSIS — R059 Cough, unspecified: Secondary | ICD-10-CM

## 2018-01-17 MED ORDER — HYDROCODONE-HOMATROPINE 5-1.5 MG/5ML PO SYRP: 5.0000 mL | ORAL_SOLUTION | Freq: Three times a day (TID) | ORAL | 0 refills | Status: DC | PRN

## 2018-01-17 NOTE — Telephone Encounter (Signed)
Pt called wanting to know if she can get a refill on the Hycodan 5-1.5 Mg.  She has a cough  [facility]  [address]  [facility] CB# [phone]   Thank s Con Memos

## 2018-01-17 NOTE — Telephone Encounter (Signed)
Refill sent.

## 2018-01-17 NOTE — Telephone Encounter (Signed)
Please Review

## 2018-01-17 NOTE — Telephone Encounter (Signed)
Patient is now feeling better

## 2018-01-26 ENCOUNTER — Other Ambulatory Visit: Payer: Self-pay | Admitting: Physician Assistant

## 2018-01-26 DIAGNOSIS — J302 Other seasonal allergic rhinitis: Secondary | ICD-10-CM

## 2018-01-28 ENCOUNTER — Ambulatory Visit (INDEPENDENT_AMBULATORY_CARE_PROVIDER_SITE_OTHER): Payer: 59 | Admitting: Physician Assistant

## 2018-01-28 ENCOUNTER — Encounter: Payer: Self-pay | Admitting: Physician Assistant

## 2018-01-28 VITALS — BP 130/96 | HR 98 | Temp 98.7°F | Resp 16 | Wt 171.0 lb

## 2018-01-28 DIAGNOSIS — M545 Low back pain, unspecified: Secondary | ICD-10-CM

## 2018-01-28 DIAGNOSIS — N200 Calculus of kidney: Secondary | ICD-10-CM

## 2018-01-28 LAB — POCT URINALYSIS DIPSTICK
Bilirubin, UA: NEGATIVE
Glucose, UA: NEGATIVE
KETONES UA: NEGATIVE
LEUKOCYTES UA: NEGATIVE
NITRITE UA: NEGATIVE
Protein, UA: NEGATIVE
Urobilinogen, UA: 0.2 E.U./dL
pH, UA: 7.5 (ref 5.0–8.0)

## 2018-01-28 MED ORDER — KETOROLAC TROMETHAMINE 30 MG/ML IJ SOLN
30.0000 mg | Freq: Once | INTRAMUSCULAR | Status: AC
  Administered 2018-01-28: 30 mg via INTRAMUSCULAR

## 2018-01-28 MED ORDER — HYDROCODONE-ACETAMINOPHEN 5-325 MG PO TABS: 1.0000 | ORAL_TABLET | Freq: Four times a day (QID) | ORAL | 0 refills | Status: DC | PRN

## 2018-01-28 NOTE — Progress Notes (Signed)
Patient: Stephanie Ball Female    DOB: 05/01/1956   62 y.o.   MRN: [id number] Visit Date: 01/28/2018  Today's Provider: Mar Daring, PA-C   Chief Complaint  Patient presents with  . Back Pain   Subjective:    Back Pain  This is a new problem. The current episode started in the past 7 days. The problem occurs constantly. The problem has been gradually worsening since onset. The pain is present in the lumbar spine. The quality of the pain is described as stabbing. The pain does not radiate. The pain is at a severity of 10/10. The pain is severe. The pain is the same all the time. Pertinent negatives include no abdominal pain, bladder incontinence, bowel incontinence, chest pain, dysuria, fever, headaches, leg pain, numbness, paresis, paresthesias, pelvic pain, perianal numbness, tingling, weakness or weight loss. Treatments tried: otc Tylenol. The treatment provided no relief.  She does have h/o renal stones.     Allergies  Allergen Reactions  . Aciphex  [Rabeprazole Sodium]   . Amoxicillin-Pot Clavulanate   . Iron   . Macrobid  [employer] Macro]   . Metoprolol Other (See Comments)    After taking medication for about 72 hours she noted elevated systolic BP and swelling of hands and feet and discontinued.     Current Outpatient Medications:  .  aspirin 81 MG tablet, Take 2 tablets by mouth at bedtime., Disp: , Rfl:  .  busPIRone (BUSPAR) 15 MG tablet, Take 1 tablet (15 mg total) 2 (two) times daily by mouth., Disp: 180 tablet, Rfl: 3 .  hydrochlorothiazide (HYDRODIURIL) 12.5 MG tablet, Take 1 tablet (12.5 mg total) by mouth daily., Disp: 90 tablet, Rfl: 3 .  lisinopril (PRINIVIL,ZESTRIL) 40 MG tablet, Take 1 tablet (40 mg total) by mouth daily., Disp: 90 tablet, Rfl: 3 .  lovastatin (MEVACOR) 40 MG tablet, Take 1 tablet (40 mg total) by mouth at bedtime., Disp: 90 tablet, Rfl: 3 .  montelukast (SINGULAIR) 10 MG tablet, TAKE 1 TABLET BY MOUTH EVERY DAY,  Disp: 90 tablet, Rfl: 3 .  OLANZapine (ZYPREXA) 15 MG tablet, Take 1 tablet (15 mg total) by mouth daily., Disp: 90 tablet, Rfl: 3 .  traZODone (DESYREL) 100 MG tablet, Take 1 tablet (100 mg total) by mouth at bedtime as needed for sleep., Disp: 90 tablet, Rfl: 3 .  venlafaxine (EFFEXOR) 75 MG tablet, Take 1 tablet (75 mg total) by mouth 3 (three) times daily with meals., Disp: 270 tablet, Rfl: 3 .  Vitamin D, Ergocalciferol, (DRISDOL) 50000 units CAPS capsule, TAKE 1 CAPSULE (50,000 UNITS TOTAL) BY MOUTH EVERY 7 (SEVEN) DAYS., Disp: 12 capsule, Rfl: 3 .  zolpidem (AMBIEN) 10 MG tablet, Take 1 tablet (10 mg total) by mouth at bedtime as needed for sleep., Disp: 90 tablet, Rfl: 1 .  clotrimazole-betamethasone (LOTRISONE) cream, APPLY TOPICALLY 2 TIMES DAILY (Patient not taking: Reported on 01/28/2018), Disp: 30 g, Rfl: 5 .  colchicine 0.6 MG tablet, Take 2 tabs PO at onset of symptoms, and one tab PO daily until symptoms resolve. Repeat as needed (Patient not taking: Reported on 01/28/2018), Disp: 90 tablet, Rfl: 1  Review of Systems  Constitutional: Negative for fever and weight loss.  Respiratory: Negative.   Cardiovascular: Negative for chest pain.  Gastrointestinal: Negative for abdominal pain and bowel incontinence.  Genitourinary: Positive for flank pain and hematuria. Negative for bladder incontinence, dysuria and pelvic pain.  Musculoskeletal: Positive for back pain.  Neurological: Negative for  tingling, weakness, numbness, headaches and paresthesias.    Social History   Tobacco Use  . Smoking status: Current Every Day Smoker  . Smokeless tobacco: Never Used  . Tobacco comment: would like to quit; as stated pt smoked 1/2 PPd for 25 years and now is smoking 5-6 cigarettes per day.  Substance Use Topics  . Alcohol use: No   Objective:   BP (!) 130/96   Pulse 98   Temp 98.7 F (37.1 C) (Oral)   Resp 16   Wt 171 lb (77.6 kg)   SpO2 98%   BMI 30.29 kg/m  Vitals:   01/28/18 1354   BP: (!) 130/96  Pulse: 98  Resp: 16  Temp: 98.7 F (37.1 C)  TempSrc: Oral  SpO2: 98%  Weight: 171 lb (77.6 kg)     Physical Exam  Constitutional: She is oriented to person, place, and time. She appears well-developed and well-nourished. No distress.  Cardiovascular: Normal rate, regular rhythm and normal heart sounds. Exam reveals no gallop and no friction rub.  No murmur heard. Pulmonary/Chest: Effort normal and breath sounds normal. No respiratory distress. She has no wheezes. She has no rales.  Abdominal: Soft. Normal appearance and bowel sounds are normal. She exhibits no distension and no mass. There is no hepatosplenomegaly. There is tenderness in the right upper quadrant and right lower quadrant. There is CVA tenderness (right). There is no rebound and no guarding.  Neurological: She is alert and oriented to person, place, and time.  Skin: Skin is warm and dry. She is not diaphoretic.  Vitals reviewed.      Assessment & Plan:     1. Acute right-sided low back pain without sciatica UA normal with exception of hematuria.  - POCT urinalysis dipstick  2. Kidney stone Highly suspicious of recurrent stone. Toradol injection given today in the office. Push fluids. Norco given as below for pain. Call if symptoms worsen.  - ketorolac (TORADOL) 30 MG/ML injection 30 mg - HYDROcodone-acetaminophen (NORCO/VICODIN) 5-325 MG tablet; Take 1 tablet by mouth every 6 (six) hours as needed for moderate pain.  Dispense: 30 tablet; Refill: 0       Mar Daring, PA-C  [facility]

## 2018-01-28 NOTE — Patient Instructions (Signed)

## 2018-01-31 ENCOUNTER — Telehealth: Payer: Self-pay | Admitting: Family Medicine

## 2018-01-31 DIAGNOSIS — R1031 Right lower quadrant pain: Secondary | ICD-10-CM

## 2018-01-31 NOTE — Telephone Encounter (Signed)
Pt was in Friday and seen [place].  She wanted to let you know she idid not pass the kidney stone.  If you want to do an xray she she she would need it to be Wednesday am  Call Back is [phone]  Thanks Con Memos

## 2018-01-31 NOTE — Telephone Encounter (Signed)
Patient advised as directed below. 

## 2018-01-31 NOTE — Telephone Encounter (Signed)
Ordered a CT stone study

## 2018-02-08 ENCOUNTER — Ambulatory Visit
Admission: RE | Admit: 2018-02-08 | Discharge: 2018-02-08 | Disposition: A | Discharge status: Home / Self Care | Payer: 59 | Source: Ambulatory Visit | Attending: Physician Assistant | Admitting: Physician Assistant

## 2018-02-08 DIAGNOSIS — I700 Atherosclerosis of aorta: Secondary | ICD-10-CM | POA: Diagnosis not present

## 2018-02-08 DIAGNOSIS — N200 Calculus of kidney: Secondary | ICD-10-CM | POA: Insufficient documentation

## 2018-02-08 DIAGNOSIS — R918 Other nonspecific abnormal finding of lung field: Secondary | ICD-10-CM | POA: Diagnosis not present

## 2018-02-08 DIAGNOSIS — N2889 Other specified disorders of kidney and ureter: Secondary | ICD-10-CM | POA: Insufficient documentation

## 2018-02-08 DIAGNOSIS — N261 Atrophy of kidney (terminal): Secondary | ICD-10-CM | POA: Insufficient documentation

## 2018-02-08 DIAGNOSIS — D3502 Benign neoplasm of left adrenal gland: Secondary | ICD-10-CM | POA: Diagnosis not present

## 2018-02-08 DIAGNOSIS — R1031 Right lower quadrant pain: Secondary | ICD-10-CM | POA: Diagnosis present

## 2018-02-08 DIAGNOSIS — K439 Ventral hernia without obstruction or gangrene: Secondary | ICD-10-CM | POA: Diagnosis not present

## 2018-02-08 DIAGNOSIS — I708 Atherosclerosis of other arteries: Secondary | ICD-10-CM | POA: Diagnosis not present

## 2018-02-15 ENCOUNTER — Other Ambulatory Visit: Payer: Self-pay | Admitting: Physician Assistant

## 2018-02-15 DIAGNOSIS — E7800 Pure hypercholesterolemia, unspecified: Secondary | ICD-10-CM

## 2018-02-20 ENCOUNTER — Other Ambulatory Visit: Payer: Self-pay | Admitting: Physician Assistant

## 2018-02-20 DIAGNOSIS — F3170 Bipolar disorder, currently in remission, most recent episode unspecified: Secondary | ICD-10-CM

## 2018-03-22 ENCOUNTER — Other Ambulatory Visit: Payer: Self-pay | Admitting: Physician Assistant

## 2018-03-22 DIAGNOSIS — F3170 Bipolar disorder, currently in remission, most recent episode unspecified: Secondary | ICD-10-CM

## 2018-03-22 NOTE — Telephone Encounter (Signed)
Pt's husband Elta Guadeloupe contacted office for refill request on the following medications:  busPIRone (BUSPAR) 15 MG tablet   [facility]   Please advise. Thanks TNP

## 2018-03-23 MED ORDER — BUSPIRONE HCL 15 MG PO TABS: 15.0000 mg | ORAL_TABLET | Freq: Two times a day (BID) | ORAL | 3 refills | Status: DC

## 2018-05-10 NOTE — Progress Notes (Signed)
Patient: Stephanie Ball Female    DOB: [birth year]   63 y.o.   MRN: [id number] Visit Date: 05/11/2018  Today's Provider: Mar Daring, PA-C   No chief complaint on file.  Subjective:     HPI    Hypertension, follow-up:  BP Readings from Last 3 Encounters:  05/11/18 136/89  01/28/18 (!) 130/96  01/10/18 (!) 148/72    She was last seen for hypertension 4 months ago.  BP at that visit was 148/72. Management since that visit includes; no changes.She reports good compliance with treatment. She is not having side effects. none She is not exercising. She is adherent to low salt diet.   Outside blood pressures are normal. She is experiencing none.  Patient denies none.   Cardiovascular risk factors include none.  Use of agents associated with hypertension: none.   -----------------------------------------------------------------   Lipid/Cholesterol, Follow-up:   Last seen for this 7 months ago.  Management since that visit includes; labs checked, no changes.  Last Lipid Panel:    Component Value Date/Time   CHOL 195 10/07/2017 0850   CHOL 196 01/05/2017 1116   TRIG 213 (H) 10/07/2017 0850   HDL 48 (L) 10/07/2017 0850   HDL 50 01/05/2017 1116   CHOLHDL 4.1 10/07/2017 0850   LDLCALC 114 (H) 10/07/2017 0850    She reports good compliance with treatment. She is not having side effects. none  Wt Readings from Last 3 Encounters:  05/11/18 169 lb (76.7 kg)  01/28/18 171 lb (77.6 kg)  01/10/18 171 lb 3.2 oz (77.7 kg)   She is also needing a refill on her Zolpidem. She does take this nightly. She does sleep well when she takes it. Does have occasional awakenings when her husband gets up for work or comes home later from work.  -----------------------------------------------------------------    Allergies  Allergen Reactions  . Aciphex  [Rabeprazole Sodium]   . Amoxicillin-Pot Clavulanate   . Iron   . Macrobid  [employer] Macro]   .  Metoprolol Other (See Comments)    After taking medication for about 72 hours she noted elevated systolic BP and swelling of hands and feet and discontinued.     Current Outpatient Medications:  .  aspirin 81 MG tablet, Take 2 tablets by mouth at bedtime., Disp: , Rfl:  .  busPIRone (BUSPAR) 15 MG tablet, Take 1 tablet (15 mg total) by mouth 2 (two) times daily., Disp: 180 tablet, Rfl: 3 .  clotrimazole-betamethasone (LOTRISONE) cream, APPLY TOPICALLY 2 TIMES DAILY, Disp: 30 g, Rfl: 5 .  colchicine 0.6 MG tablet, Take 2 tabs PO at onset of symptoms, and one tab PO daily until symptoms resolve. Repeat as needed, Disp: 90 tablet, Rfl: 1 .  hydrochlorothiazide (HYDRODIURIL) 12.5 MG tablet, Take 1 tablet (12.5 mg total) by mouth daily., Disp: 90 tablet, Rfl: 3 .  lisinopril (PRINIVIL,ZESTRIL) 40 MG tablet, Take 1 tablet (40 mg total) by mouth daily., Disp: 90 tablet, Rfl: 3 .  lovastatin (MEVACOR) 40 MG tablet, TAKE 1 TABLET BY MOUTH EVERYDAY AT BEDTIME, Disp: 90 tablet, Rfl: 3 .  montelukast (SINGULAIR) 10 MG tablet, TAKE 1 TABLET BY MOUTH EVERY DAY, Disp: 90 tablet, Rfl: 3 .  OLANZapine (ZYPREXA) 15 MG tablet, Take 1 tablet (15 mg total) by mouth daily., Disp: 90 tablet, Rfl: 3 .  traZODone (DESYREL) 100 MG tablet, Take 1 tablet (100 mg total) by mouth at bedtime as needed for sleep., Disp: 90 tablet, Rfl: 3 .  venlafaxine (EFFEXOR) 75 MG tablet, TAKE 1 TABLET (75 MG TOTAL) BY MOUTH 3 (THREE) TIMES DAILY WITH MEALS., Disp: 270 tablet, Rfl: 3 .  Vitamin D, Ergocalciferol, (DRISDOL) 50000 units CAPS capsule, TAKE 1 CAPSULE (50,000 UNITS TOTAL) BY MOUTH EVERY 7 (SEVEN) DAYS., Disp: 12 capsule, Rfl: 3 .  zolpidem (AMBIEN) 10 MG tablet, Take 1 tablet (10 mg total) by mouth at bedtime as needed for sleep., Disp: 90 tablet, Rfl: 1 .  HYDROcodone-acetaminophen (NORCO/VICODIN) 5-325 MG tablet, Take 1 tablet by mouth every 6 (six) hours as needed for moderate pain. (Patient not taking: Reported on 05/11/2018),  Disp: 30 tablet, Rfl: 0  Review of Systems  Constitutional: Negative for appetite change, chills, fatigue and fever.  Respiratory: Negative for chest tightness and shortness of breath.   Cardiovascular: Negative for chest pain and palpitations.  Gastrointestinal: Negative for abdominal pain, nausea and vomiting.  Neurological: Negative for dizziness and weakness.    Social History   Tobacco Use  . Smoking status: Current Every Day Smoker  . Smokeless tobacco: Never Used  . Tobacco comment: would like to quit; as stated pt smoked 1/2 PPd for 25 years and now is smoking 5-6 cigarettes per day.  Substance Use Topics  . Alcohol use: No      Objective:   BP 136/89 (BP Location: Right Arm, Patient Position: Sitting, Cuff Size: Large)   Pulse (!) 101   Temp 97.8 F (36.6 C) (Oral)   Resp 16   Wt 169 lb (76.7 kg)   SpO2 97%   BMI 29.94 kg/m  Vitals:   05/11/18 0852  BP: 136/89  Pulse: (!) 101  Resp: 16  Temp: 97.8 F (36.6 C)  TempSrc: Oral  SpO2: 97%  Weight: 169 lb (76.7 kg)     Physical Exam Vitals signs reviewed.  Constitutional:      General: She is not in acute distress.    Appearance: She is well-developed. She is not diaphoretic.  Neck:     Musculoskeletal: Normal range of motion and neck supple.  Cardiovascular:     Rate and Rhythm: Normal rate and regular rhythm.     Heart sounds: Normal heart sounds. No murmur. No friction rub. No gallop.   Pulmonary:     Effort: Pulmonary effort is normal. No respiratory distress.     Breath sounds: Normal breath sounds. No wheezing or rales.  Psychiatric:        Attention and Perception: Attention normal.        Mood and Affect: Mood normal.        Speech: Speech normal.        Behavior: Behavior normal.        Thought Content: Thought content normal.        Judgment: Judgment normal.         Assessment & Plan    1. Bipolar disorder in full remission, most recent episode unspecified type ([place]) Stable and  well controlled. Continue zyprexa 15mg , venlafaxine 75mg  TID, buspar 15mg  BID.   2. BMI 29.0-29.9,adult Counseled patient on healthy lifestyle modifications including dieting and exercise.   3. Benign essential HTN Stable. Continue HCTZ 12.5mg , lisinopril 40mg .  4. Primary insomnia Stable. Diagnosis pulled for medication refill. Continue current medical treatment plan. - zolpidem (AMBIEN) 10 MG tablet; Take 1 tablet (10 mg total) by mouth at bedtime as needed for sleep.  Dispense: 90 tablet; Refill: [place], PA-C  [facility]

## 2018-05-11 ENCOUNTER — Ambulatory Visit: Payer: 59 | Admitting: Physician Assistant

## 2018-05-11 ENCOUNTER — Encounter: Payer: Self-pay | Admitting: Physician Assistant

## 2018-05-11 VITALS — BP 136/89 | HR 101 | Temp 97.8°F | Resp 16 | Wt 169.0 lb

## 2018-05-11 DIAGNOSIS — Z6829 Body mass index (BMI) 29.0-29.9, adult: Secondary | ICD-10-CM

## 2018-05-11 DIAGNOSIS — I10 Essential (primary) hypertension: Secondary | ICD-10-CM

## 2018-05-11 DIAGNOSIS — F5101 Primary insomnia: Secondary | ICD-10-CM

## 2018-05-11 DIAGNOSIS — F3170 Bipolar disorder, currently in remission, most recent episode unspecified: Secondary | ICD-10-CM

## 2018-05-11 MED ORDER — ZOLPIDEM TARTRATE 10 MG PO TABS: 10.0000 mg | ORAL_TABLET | Freq: Every evening | ORAL | 1 refills | Status: DC | PRN

## 2018-05-20 ENCOUNTER — Encounter: Payer: Self-pay | Admitting: Physician Assistant

## 2018-05-20 ENCOUNTER — Ambulatory Visit: Payer: 59 | Admitting: Physician Assistant

## 2018-05-20 VITALS — BP 135/85 | HR 105 | Temp 97.8°F | Resp 16 | Wt 172.0 lb

## 2018-05-20 DIAGNOSIS — N3000 Acute cystitis without hematuria: Secondary | ICD-10-CM

## 2018-05-20 DIAGNOSIS — R300 Dysuria: Secondary | ICD-10-CM | POA: Diagnosis not present

## 2018-05-20 LAB — POCT URINALYSIS DIPSTICK
APPEARANCE: ABNORMAL
BILIRUBIN UA: NEGATIVE
Glucose, UA: NEGATIVE
Ketones, UA: NEGATIVE
Nitrite, UA: NEGATIVE
Odor: NORMAL
PH UA: 6.5 (ref 5.0–8.0)
Protein, UA: POSITIVE — AB
Spec Grav, UA: 1.01 (ref 1.010–1.025)
UROBILINOGEN UA: 0.2 U/dL

## 2018-05-20 MED ORDER — SULFAMETHOXAZOLE-TRIMETHOPRIM 800-160 MG PO TABS: 1.0000 | ORAL_TABLET | Freq: Two times a day (BID) | ORAL | 0 refills | Status: DC

## 2018-05-20 NOTE — Progress Notes (Signed)
Patient: Stephanie Ball Female    DOB: 1955/10/02   63 y.o.   MRN: [id number] Visit Date: 05/20/2018  Today's Provider: Mar Daring, PA-C   Chief Complaint  Patient presents with  . Dysuria   Subjective:     Dysuria   This is a new problem. The current episode started in the past 7 days (2 days). The problem occurs every urination. The problem has been unchanged. The quality of the pain is described as burning. The pain is moderate. There has been no fever. Associated symptoms include frequency, hesitancy and urgency. Pertinent negatives include no chills, discharge, flank pain, hematuria, nausea, possible pregnancy, sweats or vomiting. She has tried acetaminophen for the symptoms. The treatment provided mild relief.    Patient has had burning upon urination for 2 days. Patient states she also has frequency and urgency. Patient has no other symptoms. Patient has been taking tylenol with mild relief.    Allergies  Allergen Reactions  . Aciphex  [Rabeprazole Sodium]   . Amoxicillin-Pot Clavulanate   . Iron   . Macrobid  [employer] Macro]   . Metoprolol Other (See Comments)    After taking medication for about 72 hours she noted elevated systolic BP and swelling of hands and feet and discontinued.     Current Outpatient Medications:  .  aspirin 81 MG tablet, Take 2 tablets by mouth at bedtime., Disp: , Rfl:  .  busPIRone (BUSPAR) 15 MG tablet, Take 1 tablet (15 mg total) by mouth 2 (two) times daily., Disp: 180 tablet, Rfl: 3 .  clotrimazole-betamethasone (LOTRISONE) cream, APPLY TOPICALLY 2 TIMES DAILY, Disp: 30 g, Rfl: 5 .  colchicine 0.6 MG tablet, Take 2 tabs PO at onset of symptoms, and one tab PO daily until symptoms resolve. Repeat as needed, Disp: 90 tablet, Rfl: 1 .  hydrochlorothiazide (HYDRODIURIL) 12.5 MG tablet, Take 1 tablet (12.5 mg total) by mouth daily., Disp: 90 tablet, Rfl: 3 .  lisinopril (PRINIVIL,ZESTRIL) 40 MG tablet, Take 1  tablet (40 mg total) by mouth daily., Disp: 90 tablet, Rfl: 3 .  lovastatin (MEVACOR) 40 MG tablet, TAKE 1 TABLET BY MOUTH EVERYDAY AT BEDTIME, Disp: 90 tablet, Rfl: 3 .  montelukast (SINGULAIR) 10 MG tablet, TAKE 1 TABLET BY MOUTH EVERY DAY, Disp: 90 tablet, Rfl: 3 .  OLANZapine (ZYPREXA) 15 MG tablet, Take 1 tablet (15 mg total) by mouth daily., Disp: 90 tablet, Rfl: 3 .  traZODone (DESYREL) 100 MG tablet, Take 1 tablet (100 mg total) by mouth at bedtime as needed for sleep., Disp: 90 tablet, Rfl: 3 .  venlafaxine (EFFEXOR) 75 MG tablet, TAKE 1 TABLET (75 MG TOTAL) BY MOUTH 3 (THREE) TIMES DAILY WITH MEALS., Disp: 270 tablet, Rfl: 3 .  Vitamin D, Ergocalciferol, (DRISDOL) 50000 units CAPS capsule, TAKE 1 CAPSULE (50,000 UNITS TOTAL) BY MOUTH EVERY 7 (SEVEN) DAYS., Disp: 12 capsule, Rfl: 3 .  zolpidem (AMBIEN) 10 MG tablet, Take 1 tablet (10 mg total) by mouth at bedtime as needed for sleep., Disp: 90 tablet, Rfl: 1  Review of Systems  Constitutional: Negative for appetite change, chills, fatigue and fever.  Respiratory: Negative for chest tightness and shortness of breath.   Cardiovascular: Negative for chest pain and palpitations.  Gastrointestinal: Negative for abdominal pain, nausea and vomiting.  Genitourinary: Positive for dysuria, frequency, hesitancy and urgency. Negative for flank pain and hematuria.  Neurological: Negative for dizziness and weakness.    Social History   Tobacco Use  .  Smoking status: Current Every Day Smoker  . Smokeless tobacco: Never Used  . Tobacco comment: would like to quit; as stated pt smoked 1/2 PPd for 25 years and now is smoking 5-6 cigarettes per day.  Substance Use Topics  . Alcohol use: No      Objective:   BP 135/85 (BP Location: Left Arm, Cuff Size: Large)   Pulse (!) 105   Temp 97.8 F (36.6 C) (Oral)   Resp 16   Wt 172 lb (78 kg)   SpO2 98%   BMI 30.47 kg/m  Vitals:   05/20/18 0708 05/20/18 0722  BP: (!) 161/108 135/85  Pulse: (!)  105   Resp: 16   Temp: 97.8 F (36.6 C)   TempSrc: Oral   SpO2: 98%   Weight: 172 lb (78 kg)      Physical Exam Vitals signs reviewed.  Constitutional:      General: She is not in acute distress.    Appearance: Normal appearance. She is well-developed. She is not diaphoretic.  Cardiovascular:     Rate and Rhythm: Normal rate and regular rhythm.     Heart sounds: Normal heart sounds. No murmur. No friction rub. No gallop.   Pulmonary:     Effort: Pulmonary effort is normal. No respiratory distress.     Breath sounds: Normal breath sounds. No wheezing or rales.  Abdominal:     General: Bowel sounds are normal. There is no distension.     Palpations: Abdomen is soft. There is no mass.     Tenderness: There is abdominal tenderness in the suprapubic area. There is no guarding or rebound.  Skin:    General: Skin is warm and dry.  Neurological:     Mental Status: She is alert and oriented to person, place, and time.         Assessment & Plan    1. Dysuria UA positive.  - POCT urinalysis dipstick - CULTURE, URINE COMPREHENSIVE  2. Acute cystitis without hematuria Worsening symptoms. UA positive. Will treat empirically with Bactrim as below. Continue to push fluids. Urine sent for culture. Will follow up pending C&S results. She is to call if symptoms do not improve or if they worsen.  - sulfamethoxazole-trimethoprim (BACTRIM DS,SEPTRA DS) 800-160 MG tablet; Take 1 tablet by mouth 2 (two) times daily.  Dispense: 20 tablet; Refill: 0     Mar Daring, PA-C  [facility]

## 2018-05-23 LAB — CULTURE, URINE COMPREHENSIVE

## 2018-06-08 ENCOUNTER — Other Ambulatory Visit: Payer: Self-pay | Admitting: Physician Assistant

## 2018-06-08 DIAGNOSIS — F5101 Primary insomnia: Secondary | ICD-10-CM

## 2018-06-14 ENCOUNTER — Telehealth: Payer: Self-pay | Admitting: Family Medicine

## 2018-06-14 DIAGNOSIS — F5101 Primary insomnia: Secondary | ICD-10-CM

## 2018-06-14 NOTE — Telephone Encounter (Signed)
Pt called saying the insurance will not pay for the Trazodone 100 mg  She said they said they need another generic drug for sleep  CB#  [phone]

## 2018-06-14 NOTE — Telephone Encounter (Signed)
I will defer.

## 2018-06-14 NOTE — Telephone Encounter (Signed)
Can we see if we can do a PA for this? She has been stable for so long I hate to change. Plus she is already on Ambien as well so nothing else can be taken safely.

## 2018-06-14 NOTE — Telephone Encounter (Signed)
Please advise 

## 2018-06-16 NOTE — Telephone Encounter (Signed)
Per CoverMyMeds there is already an existing PA for Trazodone 100 mg.

## 2018-06-17 NOTE — Telephone Encounter (Signed)
Per [facility] trazodone rx did go through with a co-pay of $7.83.

## 2018-06-17 NOTE — Telephone Encounter (Signed)
Patient advised as directed below.  Thanks,  -Stephanie Ball 

## 2018-07-05 ENCOUNTER — Other Ambulatory Visit: Payer: Self-pay | Admitting: Physician Assistant

## 2018-07-15 ENCOUNTER — Other Ambulatory Visit: Payer: Self-pay | Admitting: Physician Assistant

## 2018-07-15 DIAGNOSIS — F319 Bipolar disorder, unspecified: Secondary | ICD-10-CM

## 2018-08-11 ENCOUNTER — Ambulatory Visit (INDEPENDENT_AMBULATORY_CARE_PROVIDER_SITE_OTHER): Payer: 59 | Admitting: Physician Assistant

## 2018-08-11 ENCOUNTER — Other Ambulatory Visit: Payer: Self-pay

## 2018-08-11 ENCOUNTER — Encounter: Payer: Self-pay | Admitting: Physician Assistant

## 2018-08-11 VITALS — BP 126/90 | HR 98 | Temp 97.8°F | Resp 16 | Wt 170.0 lb

## 2018-08-11 DIAGNOSIS — J0140 Acute pansinusitis, unspecified: Secondary | ICD-10-CM

## 2018-08-11 MED ORDER — DOXYCYCLINE HYCLATE 100 MG PO TABS: 100.0000 mg | ORAL_TABLET | Freq: Two times a day (BID) | ORAL | 0 refills | Status: DC

## 2018-08-11 NOTE — Progress Notes (Signed)
Patient: Stephanie Ball Female    DOB: Jun 09, 1955   63 y.o.   MRN: [id number] Visit Date: 08/11/2018  Today's Provider: Mar Daring, PA-C   Chief Complaint  Patient presents with  . Sore Throat   Subjective:     Sore Throat   This is a new problem. The current episode started yesterday. The problem has been unchanged. The pain is worse on the right side. There has been no fever. Associated symptoms include ear pain (Right ear pain). Pertinent negatives include no abdominal pain, congestion, coughing, headaches, neck pain, shortness of breath, swollen glands, trouble swallowing or vomiting. She has tried acetaminophen for the symptoms.    Allergies  Allergen Reactions  . Aciphex  [Rabeprazole Sodium]   . Amoxicillin-Pot Clavulanate   . Iron   . Macrobid  [employer] Macro]   . Metoprolol Other (See Comments)    After taking medication for about 72 hours she noted elevated systolic BP and swelling of hands and feet and discontinued.     Current Outpatient Medications:  .  busPIRone (BUSPAR) 15 MG tablet, Take 1 tablet (15 mg total) by mouth 2 (two) times daily., Disp: 180 tablet, Rfl: 3 .  clotrimazole-betamethasone (LOTRISONE) cream, APPLY TOPICALLY 2 TIMES DAILY, Disp: 30 g, Rfl: 5 .  colchicine 0.6 MG tablet, Take 2 tabs PO at onset of symptoms, and one tab PO daily until symptoms resolve. Repeat as needed, Disp: 90 tablet, Rfl: 1 .  hydrochlorothiazide (HYDRODIURIL) 12.5 MG tablet, Take 1 tablet (12.5 mg total) by mouth daily., Disp: 90 tablet, Rfl: 3 .  lisinopril (PRINIVIL,ZESTRIL) 40 MG tablet, Take 1 tablet (40 mg total) by mouth daily., Disp: 90 tablet, Rfl: 3 .  lovastatin (MEVACOR) 40 MG tablet, TAKE 1 TABLET BY MOUTH EVERYDAY AT BEDTIME, Disp: 90 tablet, Rfl: 3 .  montelukast (SINGULAIR) 10 MG tablet, TAKE 1 TABLET BY MOUTH EVERY DAY, Disp: 90 tablet, Rfl: 3 .  OLANZapine (ZYPREXA) 15 MG tablet, TAKE 1 TABLET BY MOUTH EVERY DAY, Disp: 90  tablet, Rfl: 3 .  sulfamethoxazole-trimethoprim (BACTRIM DS,SEPTRA DS) 800-160 MG tablet, Take 1 tablet by mouth 2 (two) times daily., Disp: 20 tablet, Rfl: 0 .  traZODone (DESYREL) 100 MG tablet, TAKE 1 TABLET (100 MG TOTAL) BY MOUTH AT BEDTIME AS NEEDED FOR SLEEP., Disp: 90 tablet, Rfl: 3 .  venlafaxine (EFFEXOR) 75 MG tablet, TAKE 1 TABLET (75 MG TOTAL) BY MOUTH 3 (THREE) TIMES DAILY WITH MEALS., Disp: 270 tablet, Rfl: 3 .  Vitamin D, Ergocalciferol, (DRISDOL) 50000 units CAPS capsule, TAKE 1 CAPSULE (50,000 UNITS TOTAL) BY MOUTH EVERY 7 (SEVEN) DAYS., Disp: 12 capsule, Rfl: 3 .  zolpidem (AMBIEN) 10 MG tablet, Take 1 tablet (10 mg total) by mouth at bedtime as needed for sleep., Disp: 90 tablet, Rfl: 1  Review of Systems  HENT: Positive for ear pain (Right ear pain), sneezing and sore throat. Negative for congestion, rhinorrhea, sinus pressure, sinus pain and trouble swallowing.   Respiratory: Negative for cough and shortness of breath.   Cardiovascular: Negative for chest pain, palpitations and leg swelling.  Gastrointestinal: Negative for abdominal pain and vomiting.  Musculoskeletal: Negative for neck pain.  Neurological: Negative for headaches.    Social History   Tobacco Use  . Smoking status: Current Every Day Smoker  . Smokeless tobacco: Never Used  . Tobacco comment: would like to quit; as stated pt smoked 1/2 PPd for 25 years and now is smoking 5-6 cigarettes per day.  Substance Use Topics  . Alcohol use: No      Objective:   BP 126/90 (BP Location: Left Arm, Patient Position: Sitting, Cuff Size: Large)   Pulse 98   Temp 97.8 F (36.6 C) (Oral)   Resp 16   Wt 170 lb (77.1 kg)   BMI 30.11 kg/m  Vitals:   08/11/18 1329  BP: 126/90  Pulse: 98  Resp: 16  Temp: 97.8 F (36.6 C)  TempSrc: Oral  Weight: 170 lb (77.1 kg)     Physical Exam Vitals signs reviewed.  Constitutional:      General: She is not in acute distress.    Appearance: She is well-developed.  She is not diaphoretic.  HENT:     Head: Normocephalic and atraumatic.     Right Ear: Hearing, ear canal and external ear normal. A middle ear effusion (clear) is present.     Left Ear: Hearing, tympanic membrane, ear canal and external ear normal.     Nose: Congestion present. No rhinorrhea.     Right Sinus: Maxillary sinus tenderness and frontal sinus tenderness present.     Left Sinus: Maxillary sinus tenderness and frontal sinus tenderness present.     Mouth/Throat:     Mouth: Mucous membranes are moist.     Pharynx: Uvula midline. Posterior oropharyngeal erythema present. No oropharyngeal exudate.  Neck:     Musculoskeletal: Normal range of motion and neck supple.     Thyroid: No thyromegaly.     Trachea: No tracheal deviation.  Cardiovascular:     Rate and Rhythm: Normal rate and regular rhythm.     Heart sounds: Normal heart sounds. No murmur. No friction rub. No gallop.   Pulmonary:     Effort: Pulmonary effort is normal. No respiratory distress.     Breath sounds: Normal breath sounds. No stridor. No wheezing or rales.  Lymphadenopathy:     Cervical: No cervical adenopathy.         Assessment & Plan    1. Acute non-recurrent pansinusitis Worsening symptoms that have not responded to OTC medications. Will give Doxycycline as below. Continue allergy medications. Stay well hydrated and get plenty of rest. Call if no symptom improvement or if symptoms worsen. - doxycycline (VIBRA-TABS) 100 MG tablet; Take 1 tablet (100 mg total) by mouth 2 (two) times daily.  Dispense: 20 tablet; Refill: 0     Mar Daring, PA-C  [facility]

## 2018-08-11 NOTE — Patient Instructions (Signed)
Mucinex or Mucinex DM

## 2018-08-24 ENCOUNTER — Other Ambulatory Visit: Payer: Self-pay

## 2018-08-24 ENCOUNTER — Ambulatory Visit (INDEPENDENT_AMBULATORY_CARE_PROVIDER_SITE_OTHER): Payer: 59 | Admitting: Physician Assistant

## 2018-08-24 ENCOUNTER — Encounter: Payer: Self-pay | Admitting: Physician Assistant

## 2018-08-24 VITALS — BP 118/84 | HR 92 | Temp 98.3°F | Resp 16 | Wt 172.0 lb

## 2018-08-24 DIAGNOSIS — H66001 Acute suppurative otitis media without spontaneous rupture of ear drum, right ear: Secondary | ICD-10-CM

## 2018-08-24 MED ORDER — AZITHROMYCIN 250 MG PO TABS: ORAL_TABLET | ORAL | 0 refills | Status: DC

## 2018-08-24 NOTE — Progress Notes (Signed)
Patient: Stephanie Ball Female    DOB: February 19, 1956   63 y.o.   MRN: [id number] Visit Date: 08/24/2018  Today's Provider: Mar Daring, PA-C   Chief Complaint  Patient presents with  . Ear Pain   Subjective:     HPI   Acute non-recurrent pansinusitis From 08/11/2018-givendoxycycline (VIBRA-TABS) 100 MG tablet; Take 1 tablet (100 mg total) by mouth 2 (two) times daily.   Patient states her cough and sinus congestion have gotten better. She is still having pain in the right ear and on the right side of the throat.    Allergies  Allergen Reactions  . Aciphex  [Rabeprazole Sodium]   . Amoxicillin-Pot Clavulanate   . Iron   . Macrobid  [employer] Macro]   . Metoprolol Other (See Comments)    After taking medication for about 72 hours she noted elevated systolic BP and swelling of hands and feet and discontinued.     Current Outpatient Medications:  .  busPIRone (BUSPAR) 15 MG tablet, Take 1 tablet (15 mg total) by mouth 2 (two) times daily., Disp: 180 tablet, Rfl: 3 .  clotrimazole-betamethasone (LOTRISONE) cream, APPLY TOPICALLY 2 TIMES DAILY, Disp: 30 g, Rfl: 5 .  colchicine 0.6 MG tablet, Take 2 tabs PO at onset of symptoms, and one tab PO daily until symptoms resolve. Repeat as needed, Disp: 90 tablet, Rfl: 1 .  hydrochlorothiazide (HYDRODIURIL) 12.5 MG tablet, Take 1 tablet (12.5 mg total) by mouth daily., Disp: 90 tablet, Rfl: 3 .  lisinopril (PRINIVIL,ZESTRIL) 40 MG tablet, Take 1 tablet (40 mg total) by mouth daily., Disp: 90 tablet, Rfl: 3 .  lovastatin (MEVACOR) 40 MG tablet, TAKE 1 TABLET BY MOUTH EVERYDAY AT BEDTIME, Disp: 90 tablet, Rfl: 3 .  montelukast (SINGULAIR) 10 MG tablet, TAKE 1 TABLET BY MOUTH EVERY DAY, Disp: 90 tablet, Rfl: 3 .  OLANZapine (ZYPREXA) 15 MG tablet, TAKE 1 TABLET BY MOUTH EVERY DAY, Disp: 90 tablet, Rfl: 3 .  traZODone (DESYREL) 100 MG tablet, TAKE 1 TABLET (100 MG TOTAL) BY MOUTH AT BEDTIME AS NEEDED FOR SLEEP.,  Disp: 90 tablet, Rfl: 3 .  venlafaxine (EFFEXOR) 75 MG tablet, TAKE 1 TABLET (75 MG TOTAL) BY MOUTH 3 (THREE) TIMES DAILY WITH MEALS., Disp: 270 tablet, Rfl: 3 .  Vitamin D, Ergocalciferol, (DRISDOL) 50000 units CAPS capsule, TAKE 1 CAPSULE (50,000 UNITS TOTAL) BY MOUTH EVERY 7 (SEVEN) DAYS., Disp: 12 capsule, Rfl: 3 .  zolpidem (AMBIEN) 10 MG tablet, Take 1 tablet (10 mg total) by mouth at bedtime as needed for sleep., Disp: 90 tablet, Rfl: 1 .  doxycycline (VIBRA-TABS) 100 MG tablet, Take 1 tablet (100 mg total) by mouth 2 (two) times daily. (Patient not taking: Reported on 08/24/2018), Disp: 20 tablet, Rfl: 0  Review of Systems  Constitutional: Negative for appetite change, chills, fatigue and fever.  HENT: Positive for ear pain and sore throat.   Respiratory: Negative for chest tightness and shortness of breath.   Cardiovascular: Negative for chest pain and palpitations.  Gastrointestinal: Negative for nausea.  Neurological: Negative for dizziness and weakness.    Social History   Tobacco Use  . Smoking status: Current Every Day Smoker  . Smokeless tobacco: Never Used  . Tobacco comment: would like to quit; as stated pt smoked 1/2 PPd for 25 years and now is smoking 5-6 cigarettes per day.  Substance Use Topics  . Alcohol use: No      Objective:   BP 118/84 (BP  Location: Right Arm, Patient Position: Sitting, Cuff Size: Large)   Pulse 92   Temp 98.3 F (36.8 C) (Oral)   Resp 16   Wt 172 lb (78 kg)   SpO2 99%   BMI 30.47 kg/m  Vitals:   08/24/18 0854  BP: 118/84  Pulse: 92  Resp: 16  Temp: 98.3 F (36.8 C)  TempSrc: Oral  SpO2: 99%  Weight: 172 lb (78 kg)     Physical Exam Vitals signs reviewed.  Constitutional:      General: She is not in acute distress.    Appearance: Normal appearance. She is well-developed. She is not ill-appearing or diaphoretic.  HENT:     Head: Normocephalic and atraumatic.     Right Ear: Hearing, ear canal and external ear normal. A  middle ear effusion is present. Tympanic membrane is erythematous and bulging.     Left Ear: Hearing, tympanic membrane, ear canal and external ear normal.     Nose: Nose normal. No congestion.     Mouth/Throat:     Mouth: Mucous membranes are moist.     Pharynx: Uvula midline. No oropharyngeal exudate or posterior oropharyngeal erythema.  Eyes:     General: No scleral icterus.       Right eye: No discharge.        Left eye: No discharge.     Conjunctiva/sclera: Conjunctivae normal.     Pupils: Pupils are equal, round, and reactive to light.  Neck:     Musculoskeletal: Normal range of motion and neck supple.     Thyroid: No thyromegaly.     Trachea: No tracheal deviation.  Cardiovascular:     Rate and Rhythm: Normal rate and regular rhythm.     Heart sounds: Normal heart sounds. No murmur. No friction rub. No gallop.   Pulmonary:     Effort: Pulmonary effort is normal. No respiratory distress.     Breath sounds: Normal breath sounds. No stridor. No wheezing or rales.  Lymphadenopathy:     Cervical: No cervical adenopathy.  Skin:    General: Skin is warm and dry.  Neurological:     Mental Status: She is alert.         Assessment & Plan    1. Non-recurrent acute suppurative otitis media of right ear without spontaneous rupture of tympanic membrane Did not respond to the doxycycline. Will treat with zpak as below. Patient is to call if ear does not improve. - azithromycin (ZITHROMAX) 250 MG tablet; Take 2 tablets PO on day one, and one tablet PO daily thereafter until completed.  Dispense: 6 tablet; Refill: 0     Mar Daring, PA-C  [facility]

## 2018-08-24 NOTE — Patient Instructions (Signed)

## 2018-10-19 ENCOUNTER — Other Ambulatory Visit: Payer: Self-pay | Admitting: Physician Assistant

## 2018-10-19 DIAGNOSIS — E559 Vitamin D deficiency, unspecified: Secondary | ICD-10-CM

## 2018-11-02 ENCOUNTER — Other Ambulatory Visit: Payer: Self-pay | Admitting: Physician Assistant

## 2018-11-02 DIAGNOSIS — F5101 Primary insomnia: Secondary | ICD-10-CM

## 2018-11-02 MED ORDER — ZOLPIDEM TARTRATE 10 MG PO TABS: 10.0000 mg | ORAL_TABLET | Freq: Every evening | ORAL | 1 refills | Status: DC | PRN

## 2018-11-02 NOTE — Telephone Encounter (Signed)
Patient came in the office for a appointment that she called and scheduled for today at 8:40 and she was not put on the schedule.  She said that she was just coming for a medication follow up for zolpidem (AMBIEN) 10 MG tablet.  She states that she always comes when she needs a refill for this medication.  She would like to know if she can have a refill on this to last until she can get in for a appointment on 11/09/2018 at 1:40.  She would like it sent to [place].

## 2018-11-09 ENCOUNTER — Ambulatory Visit (INDEPENDENT_AMBULATORY_CARE_PROVIDER_SITE_OTHER): Payer: 59 | Admitting: Physician Assistant

## 2018-11-09 ENCOUNTER — Other Ambulatory Visit: Payer: Self-pay

## 2018-11-09 ENCOUNTER — Encounter: Payer: Self-pay | Admitting: Physician Assistant

## 2018-11-09 VITALS — BP 137/90 | HR 93 | Temp 98.2°F | Resp 16 | Ht 63.0 in | Wt 173.0 lb

## 2018-11-09 DIAGNOSIS — Z1239 Encounter for other screening for malignant neoplasm of breast: Secondary | ICD-10-CM

## 2018-11-09 DIAGNOSIS — F3170 Bipolar disorder, currently in remission, most recent episode unspecified: Secondary | ICD-10-CM

## 2018-11-09 DIAGNOSIS — I10 Essential (primary) hypertension: Secondary | ICD-10-CM | POA: Diagnosis not present

## 2018-11-09 DIAGNOSIS — R945 Abnormal results of liver function studies: Secondary | ICD-10-CM

## 2018-11-09 DIAGNOSIS — Z0000 Encounter for general adult medical examination without abnormal findings: Secondary | ICD-10-CM | POA: Diagnosis not present

## 2018-11-09 DIAGNOSIS — E6609 Other obesity due to excess calories: Secondary | ICD-10-CM

## 2018-11-09 DIAGNOSIS — Z6830 Body mass index (BMI) 30.0-30.9, adult: Secondary | ICD-10-CM

## 2018-11-09 DIAGNOSIS — M109 Gout, unspecified: Secondary | ICD-10-CM

## 2018-11-09 DIAGNOSIS — F17200 Nicotine dependence, unspecified, uncomplicated: Secondary | ICD-10-CM

## 2018-11-09 DIAGNOSIS — N182 Chronic kidney disease, stage 2 (mild): Secondary | ICD-10-CM

## 2018-11-09 DIAGNOSIS — E7800 Pure hypercholesterolemia, unspecified: Secondary | ICD-10-CM

## 2018-11-09 DIAGNOSIS — R7989 Other specified abnormal findings of blood chemistry: Secondary | ICD-10-CM

## 2018-11-09 DIAGNOSIS — R739 Hyperglycemia, unspecified: Secondary | ICD-10-CM

## 2018-11-09 NOTE — Progress Notes (Signed)
Patient: Stephanie Ball Female    DOB: 12-26-1955   63 y.o.   MRN: [id number] Visit Date: 11/09/2018  Today's Provider: Mar Daring, PA-C   Chief Complaint  Patient presents with   Follow-up   Hypertension   Manic Behavior   Subjective:     HPI  Annual physical exam:  Patient feels well. Has no complaints. Sleeping well with Zolpidem prn.  She is due for her mammogram at the end of the month.   Bipolar disorder : Stable and well controlled. Patient onzyprexa 15mg , venlafaxine 75mg  TID, buspar 15mg  BID.   Benign essential HTN: Stable. Patient on HCTZ 12.5mg , lisinopril 40mg    Allergies  Allergen Reactions   Aciphex  [Rabeprazole Sodium]    Amoxicillin-Pot Clavulanate    Iron    Macrobid  [Nitrofurantoin Monohyd Macro]    Metoprolol Other (See Comments)    After taking medication for about 72 hours she noted elevated systolic BP and swelling of hands and feet and discontinued.     Current Outpatient Medications:    aspirin EC 81 MG tablet, Take 81 mg by mouth daily., Disp: , Rfl:    busPIRone (BUSPAR) 15 MG tablet, Take 1 tablet (15 mg total) by mouth 2 (two) times daily., Disp: 180 tablet, Rfl: 3   clotrimazole-betamethasone (LOTRISONE) cream, APPLY TOPICALLY 2 TIMES DAILY, Disp: 30 g, Rfl: 5   colchicine 0.6 MG tablet, Take 2 tabs PO at onset of symptoms, and one tab PO daily until symptoms resolve. Repeat as needed, Disp: 90 tablet, Rfl: 1   hydrochlorothiazide (HYDRODIURIL) 12.5 MG tablet, Take 1 tablet (12.5 mg total) by mouth daily., Disp: 90 tablet, Rfl: 3   lisinopril (PRINIVIL,ZESTRIL) 40 MG tablet, Take 1 tablet (40 mg total) by mouth daily., Disp: 90 tablet, Rfl: 3   lovastatin (MEVACOR) 40 MG tablet, TAKE 1 TABLET BY MOUTH EVERYDAY AT BEDTIME, Disp: 90 tablet, Rfl: 3   montelukast (SINGULAIR) 10 MG tablet, TAKE 1 TABLET BY MOUTH EVERY DAY, Disp: 90 tablet, Rfl: 3   OLANZapine (ZYPREXA) 15 MG tablet, TAKE 1 TABLET BY MOUTH  EVERY DAY, Disp: 90 tablet, Rfl: 3   traZODone (DESYREL) 100 MG tablet, TAKE 1 TABLET (100 MG TOTAL) BY MOUTH AT BEDTIME AS NEEDED FOR SLEEP., Disp: 90 tablet, Rfl: 3   venlafaxine (EFFEXOR) 75 MG tablet, TAKE 1 TABLET (75 MG TOTAL) BY MOUTH 3 (THREE) TIMES DAILY WITH MEALS., Disp: 270 tablet, Rfl: 3   Vitamin D, Ergocalciferol, (DRISDOL) 1.25 MG (50000 UT) CAPS capsule, TAKE 1 CAPSULE (50,000 UNITS TOTAL) BY MOUTH EVERY 7 (SEVEN) DAYS., Disp: 12 capsule, Rfl: 3   zolpidem (AMBIEN) 10 MG tablet, Take 1 tablet (10 mg total) by mouth at bedtime as needed for sleep., Disp: 90 tablet, Rfl: 1   azithromycin (ZITHROMAX) 250 MG tablet, Take 2 tablets PO on day one, and one tablet PO daily thereafter until completed. (Patient not taking: Reported on 11/09/2018), Disp: 6 tablet, Rfl: 0   doxycycline (VIBRA-TABS) 100 MG tablet, Take 1 tablet (100 mg total) by mouth 2 (two) times daily. (Patient not taking: Reported on 08/24/2018), Disp: 20 tablet, Rfl: 0  Review of Systems  Constitutional: Negative for appetite change, chills, fatigue and fever.  HENT: Negative.   Eyes: Negative.   Respiratory: Negative.   Cardiovascular: Negative for chest pain, palpitations and leg swelling.  Gastrointestinal: Negative for abdominal pain, nausea and vomiting.  Endocrine: Negative.   Genitourinary: Negative.   Musculoskeletal: Negative.   Skin:  Negative.   Allergic/Immunologic: Negative.   Neurological: Negative for dizziness and weakness.  Hematological: Negative.   Psychiatric/Behavioral: Negative.     Social History   Tobacco Use   Smoking status: Current Every Day Smoker   Smokeless tobacco: Never Used   Tobacco comment: would like to quit; as stated pt smoked 1/2 PPd for 25 years and now is smoking 5-6 cigarettes per day.  Substance Use Topics   Alcohol use: No      Objective:   BP 137/90 (BP Location: Right Arm, Patient Position: Sitting, Cuff Size: Large)    Pulse 93    Temp 98.2 F (36.8  C) (Oral)    Resp 16    Ht 5\' 3"  (1.6 m)    Wt 173 lb (78.5 kg)    SpO2 97%    BMI 30.65 kg/m  Vitals:   11/09/18 1339  BP: 137/90  Pulse: 93  Resp: 16  Temp: 98.2 F (36.8 C)  TempSrc: Oral  SpO2: 97%  Weight: 173 lb (78.5 kg)  Height: 5\' 3"  (1.6 m)     Physical Exam Vitals signs reviewed.  Constitutional:      General: She is not in acute distress.    Appearance: Normal appearance. She is well-developed. She is obese. She is not ill-appearing or diaphoretic.  HENT:     Head: Normocephalic and atraumatic.     Right Ear: Tympanic membrane, ear canal and external ear normal.     Left Ear: Tympanic membrane, ear canal and external ear normal.     Nose: Nose normal.     Mouth/Throat:     Mouth: Mucous membranes are moist.     Pharynx: No oropharyngeal exudate or posterior oropharyngeal erythema.  Eyes:     General: No scleral icterus.       Right eye: No discharge.        Left eye: No discharge.     Extraocular Movements: Extraocular movements intact.     Conjunctiva/sclera: Conjunctivae normal.     Pupils: Pupils are equal, round, and reactive to light.  Neck:     Musculoskeletal: Normal range of motion and neck supple.     Thyroid: No thyromegaly.     Vascular: No carotid bruit or JVD.     Trachea: No tracheal deviation.  Cardiovascular:     Rate and Rhythm: Normal rate and regular rhythm.     Pulses: Normal pulses.     Heart sounds: Normal heart sounds. No murmur. No friction rub. No gallop.   Pulmonary:     Effort: Pulmonary effort is normal. No respiratory distress.     Breath sounds: Normal breath sounds. No wheezing or rales.  Chest:     Chest wall: No tenderness.  Abdominal:     General: Bowel sounds are normal. There is no distension.     Palpations: Abdomen is soft. There is no mass.     Tenderness: There is no abdominal tenderness. There is no guarding or rebound.  Musculoskeletal: Normal range of motion.        General: No tenderness.    Lymphadenopathy:     Cervical: No cervical adenopathy.  Skin:    General: Skin is warm and dry.     Capillary Refill: Capillary refill takes less than 2 seconds.     Findings: No rash.  Neurological:     General: No focal deficit present.     Mental Status: She is alert and oriented to person, place, and time. Mental  status is at baseline.     Cranial Nerves: No cranial nerve deficit.     Motor: No weakness.     Gait: Gait normal.  Psychiatric:        Mood and Affect: Mood normal.        Behavior: Behavior normal.        Thought Content: Thought content normal.        Judgment: Judgment normal.      No results found for any visits on 11/09/18.     Assessment & Plan    1. Annual physical exam Normal physical exam today. Will check labs as below and f/u pending lab results. If labs are stable and WNL she will not need to have these rechecked for one year at her next annual physical exam. She is to call the office in the meantime if she has any acute issue, questions or concerns.  2. Breast cancer screening There is no family history of breast cancer. She does perform regular self breast exams. Mammogram was ordered as below. Information for [facility] was given to patient so she may schedule her mammogram at her convenience. - MM 3D SCREEN BREAST BILATERAL; Future  3. Benign essential HTN Slightly elevated today. Continue lisinopril 40mg  daily and HCTZ 12.5mg . Will check labs as below and f/u pending results. - CBC w/Diff/Platelet - Comprehensive Metabolic Panel (CMET) - Lipid Profile - Vitamin D (25 hydroxy) - HgB A1c - TSH - Uric acid  4. Acute gouty arthritis Stable. Uses Colchicine prn. Will check labs as below and f/u pending results. - CBC w/Diff/Platelet - Comprehensive Metabolic Panel (CMET) - Lipid Profile - Vitamin D (25 hydroxy) - HgB A1c - TSH - Uric acid  5. Chronic kidney disease (CKD), stage II (mild) Stable. On lisinopril. Will check  labs as below and f/u pending results. - CBC w/Diff/Platelet - Comprehensive Metabolic Panel (CMET) - Lipid Profile - Vitamin D (25 hydroxy) - HgB A1c - TSH - Uric acid  6. Abnormal LFTs Stable. Diet controlled. Will check labs as below and f/u pending results. - CBC w/Diff/Platelet - Comprehensive Metabolic Panel (CMET) - Lipid Profile - Vitamin D (25 hydroxy) - HgB A1c - TSH - Uric acid  7. Bipolar disorder in full remission, most recent episode unspecified type ([place]) Stable. Continue Buspirone 15mg  BID, Zyprexa 15mg , trazodone 100mg  prn and venlafaxine 75mg  TID. Will check labs as below and f/u pending results. - CBC w/Diff/Platelet - Comprehensive Metabolic Panel (CMET) - Lipid Profile - Vitamin D (25 hydroxy) - HgB A1c - TSH - Uric acid  8. Blood glucose elevated Diet controlled. Will check labs as below and f/u pending results. - CBC w/Diff/Platelet - Comprehensive Metabolic Panel (CMET) - Lipid Profile - Vitamin D (25 hydroxy) - HgB A1c - TSH - Uric acid  9. Compulsive tobacco user syndrome Does not desire to quit smoking at this time. Will check labs as below and f/u pending results. - CBC w/Diff/Platelet - Comprehensive Metabolic Panel (CMET) - Lipid Profile - Vitamin D (25 hydroxy) - HgB A1c - TSH - Uric acid  10. Hypercholesterolemia without hypertriglyceridemia Stable. Continue Lovastatin 40mg . Will check labs as below and f/u pending results. - CBC w/Diff/Platelet - Comprehensive Metabolic Panel (CMET) - Lipid Profile - Vitamin D (25 hydroxy) - HgB A1c - TSH - Uric acid  11. Class 1 obesity due to excess calories with serious comorbidity and body mass index (BMI) of 30.0 to 30.9 in adult Counseled patient on healthy lifestyle  modifications including dieting and exercise.      Mar Daring, PA-C  [facility]

## 2018-11-09 NOTE — Patient Instructions (Signed)

## 2018-11-10 ENCOUNTER — Telehealth: Payer: Self-pay

## 2018-11-10 LAB — COMPREHENSIVE METABOLIC PANEL
ALT: 11 IU/L (ref 0–32)
AST: 13 IU/L (ref 0–40)
Albumin/Globulin Ratio: 1.8 (ref 1.2–2.2)
Albumin: 4.1 g/dL (ref 3.8–4.8)
Alkaline Phosphatase: 77 IU/L (ref 39–117)
BUN/Creatinine Ratio: 8 — ABNORMAL LOW (ref 12–28)
BUN: 10 mg/dL (ref 8–27)
Bilirubin Total: 0.4 mg/dL (ref 0.0–1.2)
CO2: 22 mmol/L (ref 20–29)
Calcium: 10.5 mg/dL — ABNORMAL HIGH (ref 8.7–10.3)
Chloride: 101 mmol/L (ref 96–106)
Creatinine, Ser: 1.31 mg/dL — ABNORMAL HIGH (ref 0.57–1.00)
GFR calc Af Amer: 51 mL/min/{1.73_m2} — ABNORMAL LOW (ref >59)
GFR calc non Af Amer: 44 mL/min/{1.73_m2} — ABNORMAL LOW (ref >59)
Globulin, Total: 2.3 g/dL (ref 1.5–4.5)
Glucose: 84 mg/dL (ref 65–99)
Potassium: 3.4 mmol/L — ABNORMAL LOW (ref 3.5–5.2)
Sodium: 139 mmol/L (ref 134–144)
Total Protein: 6.4 g/dL (ref 6.0–8.5)

## 2018-11-10 LAB — CBC WITH DIFFERENTIAL/PLATELET
Basophils Absolute: 0 10*3/uL (ref 0.0–0.2)
Basos: 0 %
EOS (ABSOLUTE): 0.2 10*3/uL (ref 0.0–0.4)
Eos: 2 %
Hematocrit: 40 % (ref 34.0–46.6)
Hemoglobin: 13.9 g/dL (ref 11.1–15.9)
Immature Grans (Abs): 0 10*3/uL (ref 0.0–0.1)
Immature Granulocytes: 0 %
Lymphocytes Absolute: 3.6 10*3/uL — ABNORMAL HIGH (ref 0.7–3.1)
Lymphs: 39 %
MCH: 30.7 pg (ref 26.6–33.0)
MCHC: 34.8 g/dL (ref 31.5–35.7)
MCV: 88 fL (ref 79–97)
Monocytes Absolute: 0.5 10*3/uL (ref 0.1–0.9)
Monocytes: 5 %
Neutrophils Absolute: 5 10*3/uL (ref 1.4–7.0)
Neutrophils: 54 %
Platelets: 282 10*3/uL (ref 150–450)
RBC: 4.53 x10E6/uL (ref 3.77–5.28)
RDW: 14 % (ref 11.7–15.4)
WBC: 9.2 10*3/uL (ref 3.4–10.8)

## 2018-11-10 LAB — HEMOGLOBIN A1C
Est. average glucose Bld gHb Est-mCnc: 111 mg/dL
Hgb A1c MFr Bld: 5.5 % (ref 4.8–5.6)

## 2018-11-10 LAB — LIPID PANEL
Chol/HDL Ratio: 4.6 ratio — ABNORMAL HIGH (ref 0.0–4.4)
Cholesterol, Total: 194 mg/dL (ref 100–199)
HDL: 42 mg/dL (ref >39)
LDL Calculated: 79 mg/dL (ref 0–99)
Triglycerides: 367 mg/dL — ABNORMAL HIGH (ref 0–149)
VLDL Cholesterol Cal: 73 mg/dL — ABNORMAL HIGH (ref 5–40)

## 2018-11-10 LAB — VITAMIN D 25 HYDROXY (VIT D DEFICIENCY, FRACTURES): Vit D, 25-Hydroxy: 52.6 ng/mL (ref 30.0–100.0)

## 2018-11-10 LAB — TSH: TSH: 2 u[IU]/mL (ref 0.450–4.500)

## 2018-11-10 LAB — URIC ACID: Uric Acid: 8.2 mg/dL — ABNORMAL HIGH (ref 2.5–7.1)

## 2018-11-10 NOTE — Telephone Encounter (Signed)
Pt advised.  She states she is not having any gout symptoms right now.    Thanks,   -Mickel Baas

## 2018-11-10 NOTE — Telephone Encounter (Signed)
-----   Message from Mar Daring, [place] sent at 11/10/2018 10:46 AM EDT ----- Blood count is normal. Kidney function is down slightly again. This is most likely from dehydration. Make sure to push fluids. Potassium is still borderline low but improved from last year. Calcium is stable. Liver enzymes are normal. Cholesterol is stable. The triglycerides are up due to non fasting as discussed. Vit D is normal. Sugar is normal. Thyroid is normal. Uric acid level is up. Are you having any active gout symptoms?

## 2018-11-27 ENCOUNTER — Other Ambulatory Visit: Payer: Self-pay | Admitting: Physician Assistant

## 2018-11-27 DIAGNOSIS — I10 Essential (primary) hypertension: Secondary | ICD-10-CM

## 2018-11-27 DIAGNOSIS — J302 Other seasonal allergic rhinitis: Secondary | ICD-10-CM

## 2019-01-06 ENCOUNTER — Other Ambulatory Visit: Payer: Self-pay | Admitting: Physician Assistant

## 2019-01-06 DIAGNOSIS — I10 Essential (primary) hypertension: Secondary | ICD-10-CM

## 2019-02-02 ENCOUNTER — Other Ambulatory Visit: Payer: Self-pay | Admitting: Physician Assistant

## 2019-02-02 DIAGNOSIS — E7800 Pure hypercholesterolemia, unspecified: Secondary | ICD-10-CM

## 2019-02-14 ENCOUNTER — Ambulatory Visit
Admission: RE | Admit: 2019-02-14 | Discharge: 2019-02-14 | Disposition: A | Discharge status: Home / Self Care | Payer: 59 | Source: Ambulatory Visit | Attending: Physician Assistant | Admitting: Physician Assistant

## 2019-02-14 DIAGNOSIS — Z1231 Encounter for screening mammogram for malignant neoplasm of breast: Secondary | ICD-10-CM | POA: Diagnosis not present

## 2019-02-14 DIAGNOSIS — Z1239 Encounter for other screening for malignant neoplasm of breast: Secondary | ICD-10-CM

## 2019-02-16 ENCOUNTER — Encounter: Payer: Self-pay | Admitting: Emergency Medicine

## 2019-02-16 ENCOUNTER — Other Ambulatory Visit: Payer: Self-pay

## 2019-02-16 ENCOUNTER — Emergency Department: Payer: No Typology Code available for payment source

## 2019-02-16 ENCOUNTER — Emergency Department
Admission: EM | Admit: 2019-02-16 | Discharge: 2019-02-16 | Disposition: A | Discharge status: Home / Self Care | Payer: No Typology Code available for payment source | Attending: Emergency Medicine | Admitting: Emergency Medicine

## 2019-02-16 DIAGNOSIS — I129 Hypertensive chronic kidney disease with stage 1 through stage 4 chronic kidney disease, or unspecified chronic kidney disease: Secondary | ICD-10-CM | POA: Diagnosis not present

## 2019-02-16 DIAGNOSIS — Z79899 Other long term (current) drug therapy: Secondary | ICD-10-CM | POA: Diagnosis not present

## 2019-02-16 DIAGNOSIS — N23 Unspecified renal colic: Secondary | ICD-10-CM

## 2019-02-16 DIAGNOSIS — N200 Calculus of kidney: Secondary | ICD-10-CM | POA: Diagnosis not present

## 2019-02-16 DIAGNOSIS — R109 Unspecified abdominal pain: Secondary | ICD-10-CM | POA: Diagnosis present

## 2019-02-16 DIAGNOSIS — F17200 Nicotine dependence, unspecified, uncomplicated: Secondary | ICD-10-CM | POA: Diagnosis not present

## 2019-02-16 DIAGNOSIS — Z7982 Long term (current) use of aspirin: Secondary | ICD-10-CM | POA: Insufficient documentation

## 2019-02-16 DIAGNOSIS — N182 Chronic kidney disease, stage 2 (mild): Secondary | ICD-10-CM | POA: Diagnosis not present

## 2019-02-16 LAB — URINALYSIS, COMPLETE (UACMP) WITH MICROSCOPIC
Bilirubin Urine: NEGATIVE
Glucose, UA: NEGATIVE mg/dL
Ketones, ur: NEGATIVE mg/dL
Nitrite: NEGATIVE
Protein, ur: 100 mg/dL — AB
Specific Gravity, Urine: 1.019 (ref 1.005–1.030)
pH: 5 (ref 5.0–8.0)

## 2019-02-16 LAB — CBC
HCT: 40.7 % (ref 36.0–46.0)
Hemoglobin: 14.5 g/dL (ref 12.0–15.0)
MCH: 30.2 pg (ref 26.0–34.0)
MCHC: 35.6 g/dL (ref 30.0–36.0)
MCV: 84.8 fL (ref 80.0–100.0)
Platelets: 305 10*3/uL (ref 150–400)
RBC: 4.8 MIL/uL (ref 3.87–5.11)
RDW: 13.4 % (ref 11.5–15.5)
WBC: 13.3 10*3/uL — ABNORMAL HIGH (ref 4.0–10.5)
nRBC: 0 % (ref 0.0–0.2)

## 2019-02-16 LAB — BASIC METABOLIC PANEL
Anion gap: 9 (ref 5–15)
BUN: 15 mg/dL (ref 8–23)
CO2: 23 mmol/L (ref 22–32)
Calcium: 10.2 mg/dL (ref 8.9–10.3)
Chloride: 105 mmol/L (ref 98–111)
Creatinine, Ser: 1.63 mg/dL — ABNORMAL HIGH (ref 0.44–1.00)
GFR calc Af Amer: 39 mL/min — ABNORMAL LOW (ref >60)
GFR calc non Af Amer: 34 mL/min — ABNORMAL LOW (ref >60)
Glucose, Bld: 135 mg/dL — ABNORMAL HIGH (ref 70–99)
Potassium: 3.3 mmol/L — ABNORMAL LOW (ref 3.5–5.1)
Sodium: 137 mmol/L (ref 135–145)

## 2019-02-16 MED ORDER — OXYCODONE-ACETAMINOPHEN 5-325 MG PO TABS: 1.0000 | ORAL_TABLET | ORAL | 0 refills | Status: DC | PRN

## 2019-02-16 MED ORDER — MORPHINE SULFATE (PF) 4 MG/ML IV SOLN
4.0000 mg | Freq: Once | INTRAVENOUS | Status: AC
  Administered 2019-02-16: 4 mg via INTRAVENOUS
  Filled 2019-02-16: qty 1

## 2019-02-16 MED ORDER — OXYCODONE HCL 5 MG PO TABS
5.0000 mg | ORAL_TABLET | Freq: Once | ORAL | Status: AC
  Administered 2019-02-16: 5 mg via ORAL
  Filled 2019-02-16: qty 1

## 2019-02-16 MED ORDER — OXYCODONE HCL 5 MG PO TABS: 5.0000 mg | ORAL_TABLET | Freq: Once | ORAL | Status: DC

## 2019-02-16 MED ORDER — ONDANSETRON HCL 4 MG/2ML IJ SOLN
4.0000 mg | Freq: Once | INTRAMUSCULAR | Status: AC
  Administered 2019-02-16: 4 mg via INTRAVENOUS
  Filled 2019-02-16: qty 2

## 2019-02-16 MED ORDER — ACETAMINOPHEN 500 MG PO TABS
1000.0000 mg | ORAL_TABLET | Freq: Once | ORAL | Status: AC
  Administered 2019-02-16: 1000 mg via ORAL
  Filled 2019-02-16: qty 2

## 2019-02-16 MED ORDER — FENTANYL CITRATE (PF) 100 MCG/2ML IJ SOLN
50.0000 ug | INTRAMUSCULAR | Status: DC | PRN
  Administered 2019-02-16: 50 ug via INTRAVENOUS
  Filled 2019-02-16: qty 2

## 2019-02-16 MED ORDER — SODIUM CHLORIDE 0.9 % IV BOLUS
500.0000 mL | Freq: Once | INTRAVENOUS | Status: AC
  Administered 2019-02-16: 500 mL via INTRAVENOUS

## 2019-02-16 MED ORDER — ONDANSETRON 4 MG PO TBDP: 4.0000 mg | ORAL_TABLET | Freq: Three times a day (TID) | ORAL | 0 refills | Status: DC | PRN

## 2019-02-16 MED ORDER — TAMSULOSIN HCL 0.4 MG PO CAPS
0.4000 mg | ORAL_CAPSULE | Freq: Once | ORAL | Status: AC
  Administered 2019-02-16: 0.4 mg via ORAL
  Filled 2019-02-16 (×2): qty 1

## 2019-02-16 MED ORDER — TAMSULOSIN HCL 0.4 MG PO CAPS: 0.4000 mg | ORAL_CAPSULE | Freq: Every day | ORAL | 0 refills | Status: AC

## 2019-02-16 NOTE — Discharge Instructions (Addendum)
You have been seen in the Emergency Department (ED)  Today and was diagnosed with kidney stones. While the stone is traveling through the ureter, which is the tube that carries urine from the kidney to the bladder, you will probably feel pain. The pain may be mild or very severe. You may also have some blood in your urine. As soon as the stone reaches the bladder, any intense pain should go away. If a stone is too large to pass on its own, you may need a medical procedure to help you pass the stone.   As we have discussed, please drink plenty of fluids and use a urinary strainer to attempt to capture the stone.  Please make a follow up appointment with Urology in the next week by calling the number below and bring the stone with you.  Take one percocet every 4-6 hours. Do not take tylenol while taking percocet. Please also take your prescribed flomax daily.   Follow-up with your doctor or return to the ER in 12-24 hours if your pain is not well controlled, if you develop pain or burning with urination, or if you develop a fever. Otherwise follow up in 3-5 days with your doctor.  When should you call for help?  Call your doctor now or seek immediate medical care if:  You cannot keep down fluids.  Your pain gets worse.  You have a fever or chills.  You have new or worse pain in your back just below your rib cage (the flank area).  You have new or more blood in your urine. You have pain or burning with urination You are unable to urinate You have abdominal pain  Watch closely for changes in your health, and be sure to contact your doctor if:  You do not get better as expected  How can you care for yourself at home?  Drink plenty of fluids, enough so that your urine is light yellow or clear like water. If you have kidney, heart, or liver disease and have to limit fluids, talk with your doctor before you increase the amount of fluids you drink.  Take pain medicines exactly as directed. Call your  doctor if you think you are having a problem with your medicine.  If the doctor gave you a prescription medicine for pain, take it as prescribed.  If you are not taking a prescription pain medicine, ask your doctor if you can take an over-the-counter medicine. Read and follow all instructions on the label. Your doctor may ask you to strain your urine so that you can collect your kidney stone when it passes. You can use a kitchen strainer or a tea strainer to catch the stone. Store it in a plastic bag until you see your doctor again.  Preventing future kidney stones  Some changes in your diet may help prevent kidney stones. Depending on the cause of your stones, your doctor may recommend that you:  Drink plenty of fluids, enough so that your urine is light yellow or clear like water. If you have kidney, heart, or liver disease and have to limit fluids, talk with your doctor before you increase the amount of fluids you drink.  Limit coffee, tea, and alcohol. Also avoid grapefruit juice.  Do not take more than the recommended daily dose of vitamins C and D.  Avoid antacids such as Gaviscon, Maalox, Mylanta, or Tums.  Limit the amount of salt (sodium) in your diet.  Eat a balanced diet that is not  too high in protein.  Limit foods that are high in a substance called oxalate, which can cause kidney stones. These foods include dark green vegetables, rhubarb, chocolate, wheat bran, nuts, cranberries, and beans.

## 2019-02-16 NOTE — ED Triage Notes (Signed)
Pt arrives with complaints of sudden onset of right flank pain that started this morning at 0800. HX of kidney stones; pt reports this feels similar.

## 2019-02-16 NOTE — ED Provider Notes (Signed)
[facility] Emergency Department Provider Note  ____________________________________________  Time seen: Approximately 3:54 PM  I have reviewed the triage vital signs and the nursing notes.   HISTORY  Chief Complaint Flank Pain   HPI Stephanie Ball is a 63 y.o. female with history of kidney stones who presents for evaluation of right flank pain.  Symptoms started this morning.  Pain is 10 out of 10, sharp, severe, located in the right flank and radiating down to her abdomen.  Pain is associated with nausea and several episodes of nonbloody nonbilious emesis.  Pain is identical to prior history of kidney stones.  Patient reports passing all her stones on her own and never required any procedure.  No constipation or diarrhea, no dysuria or hematuria, no fever or chills, no chest pain, cough, or shortness of breath.   Past Medical History:  Diagnosis Date  . Bipolar disorder ([place]) 08/24/2008  . Chronic kidney disease   . Depressive disorder 08/24/2008  . Disorder of kidney and ureter 07/19/2009  . Fibrocystic breast disease   . Hematuria 08/24/2008  . Hydronephrosis of left kidney   . Hypercalcemia 07/19/2009  . Hyperglycemia   . Hyperlipidemia 08/24/2008  . Hypertension 08/24/2008   essential, benign  . Hypokalemia   . Panic attack   . Pure hypercholesterolemia 09/28//2010    Patient Active Problem List   Diagnosis Date Noted  . Acute gouty arthritis 02/10/2016  . Insomnia 12/07/2014  . Anxiety 12/06/2014  . Allergic rhinitis 12/06/2014  . 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 Abnormal LFTs 12/06/2014  . Bloodgood disease 12/06/2014  . History of hydronephrosis 12/06/2014  . Blood glucose elevated 12/06/2014  . Decreased potassium in the blood 12/06/2014  . Arterial blood pressure decreased 12/06/2014  . History of panic attacks 12/06/2014  . Breath shortness 12/06/2014  . Abnormal mammogram 12/06/2014  . Chronic kidney disease (CKD), stage II  (mild) 08/29/2012  . History of metabolic disorder [id number]  . Kidney cysts 08/29/2012  . Calcium blood increased 07/19/2009  . Avitaminosis D 02/07/2009  . Hypercholesterolemia without hypertriglyceridemia 01/29/2009  . Compulsive tobacco user syndrome 09/21/2008  . Affective bipolar disorder ([address]) 08/24/2008  . Clinical depression 08/24/2008  . Endometriosis 08/24/2008  . Blood in the urine 08/24/2008  . Benign essential HTN 08/24/2008  . Syncope and collapse 08/24/2008    Past Surgical History:  Procedure Laterality Date  . ABDOMINAL HYSTERECTOMY     abdominal:due to fibroid and endometriosis. No history of abnormal paps. Ovaries removed also.  Marland Kitchen BREAST BIOPSY Right 2012   FIBROEPITHEIAL LESION WITH FLORID DUCTAL  . CHOLECYSTECTOMY     no further information given  . KIDNEY SURGERY     as stated pt had kidney surgery with no further given  . OOPHORECTOMY Bilateral 1996   Dr, DeFrancisco  . TUBAL LIGATION      Prior to Admission medications   Medication Sig Start Date End Date Taking? Authorizing Provider  aspirin EC 81 MG tablet Take 81 mg by mouth daily.    [provider]  busPIRone (BUSPAR) 15 MG tablet Take 1 tablet (15 mg total) by mouth 2 (two) times daily. 03/23/18   Mar Daring, PA-C  clotrimazole-betamethasone (LOTRISONE) cream APPLY TOPICALLY 2 TIMES DAILY 07/05/18   Mar Daring, PA-C  colchicine 0.6 MG tablet Take 2 tabs PO at onset of symptoms, and one tab PO daily until symptoms resolve. Repeat as needed 12/07/17   Mar Daring, PA-C  hydrochlorothiazide (HYDRODIURIL) 12.5  MG tablet TAKE 1 TABLET BY MOUTH EVERY DAY 11/28/18   Mar Daring, PA-C  lisinopril (ZESTRIL) 40 MG tablet TAKE 1 TABLET BY MOUTH EVERY DAY 01/06/19   Mar Daring, PA-C  lovastatin (MEVACOR) 40 MG tablet TAKE 1 TABLET BY MOUTH EVERYDAY AT BEDTIME 02/02/19   Fenton Malling M, PA-C  montelukast (SINGULAIR) 10 MG tablet TAKE 1 TABLET BY MOUTH  EVERY DAY 11/28/18   Mar Daring, PA-C  OLANZapine (ZYPREXA) 15 MG tablet TAKE 1 TABLET BY MOUTH EVERY DAY 07/15/18   Mar Daring, PA-C  ondansetron (ZOFRAN ODT) 4 MG disintegrating tablet Take 1 tablet (4 mg total) by mouth every 8 (eight) hours as needed. 02/16/19   Rudene Re, MD  oxyCODONE-acetaminophen (PERCOCET) 5-325 MG tablet Take 1 tablet by mouth every 4 (four) hours as needed. 02/16/19   Rudene Re, MD  tamsulosin (FLOMAX) 0.4 MG CAPS capsule Take 1 capsule (0.4 mg total) by mouth daily for 7 days. 02/16/19 02/23/19  Rudene Re, MD  traZODone (DESYREL) 100 MG tablet TAKE 1 TABLET (100 MG TOTAL) BY MOUTH AT BEDTIME AS NEEDED FOR SLEEP. 06/08/18   Mar Daring, PA-C  venlafaxine (EFFEXOR) 75 MG tablet TAKE 1 TABLET (75 MG TOTAL) BY MOUTH 3 (THREE) TIMES DAILY WITH MEALS. 02/21/18   Mar Daring, PA-C  Vitamin D, Ergocalciferol, (DRISDOL) 1.25 MG (50000 UT) CAPS capsule TAKE 1 CAPSULE (50,000 UNITS TOTAL) BY MOUTH EVERY 7 (SEVEN) DAYS. 10/19/18   Mar Daring, PA-C  zolpidem (AMBIEN) 10 MG tablet Take 1 tablet (10 mg total) by mouth at bedtime as needed for sleep. 11/02/18   Mar Daring, PA-C    Allergies Aciphex  Graciella [place] sodium], Amoxicillin-pot clavulanate, Iron, Macrobid  [nitrofurantoin monohyd macro], and Metoprolol  Family History  Problem Relation Age of Onset  . Anuerysm Mother   . Diabetes Father   . Cancer Paternal Grandmother        skin  . Breast cancer Neg Hx     Social History Social History   Tobacco Use  . Smoking status: Current Every Day Smoker  . Smokeless tobacco: Never Used  . Tobacco comment: would like to quit; as stated pt smoked 1/2 PPd for 25 years and now is smoking 5-6 cigarettes per day.  Substance Use Topics  . Alcohol use: No  . Drug use: No    Review of Systems  Constitutional: Negative for fever. Eyes: Negative for visual changes. ENT: Negative for sore throat.  Neck: No neck pain  Cardiovascular: Negative for chest pain. Respiratory: Negative for shortness of breath. Gastrointestinal: Negative for abdominal pain, diarrhea. + N/V Genitourinary: Negative for dysuria. + R flank pain Musculoskeletal: Negative for back pain. Skin: Negative for rash. Neurological: Negative for headaches, weakness or numbness. Psych: No SI or HI  ____________________________________________   PHYSICAL EXAM:  VITAL SIGNS: ED Triage Vitals  Enc Vitals Group     BP 02/16/19 1417 (!) 177/111     Pulse Rate 02/16/19 1417 98     Resp 02/16/19 1417 16     Temp 02/16/19 1417 98.5 F (36.9 C)     Temp Source 02/16/19 1417 Oral     SpO2 02/16/19 1417 96 %     Weight --      Height --      Head Circumference --      Peak Flow --      Pain Score 02/16/19 1419 10     Pain Loc --  Pain Edu? --      Excl. in [place]? --     Constitutional: Alert and oriented. Well appearing and in no apparent distress. HEENT:      Head: Normocephalic and atraumatic.         Eyes: Conjunctivae are normal. Sclera is non-icteric.       Mouth/Throat: Mucous membranes are moist.       Neck: Supple with no signs of meningismus. Cardiovascular: Regular rate and rhythm. No murmurs, gallops, or rubs. 2+ symmetrical distal pulses are present in all extremities. No JVD. Respiratory: Normal respiratory effort. Lungs are clear to auscultation bilaterally. No wheezes, crackles, or rhonchi.  Gastrointestinal: Soft, non tender, and non distended with positive bowel sounds. No rebound or guarding. Genitourinary: No CVA tenderness. Musculoskeletal: Nontender with normal range of motion in all extremities. No edema, cyanosis, or erythema of extremities. Neurologic: Normal speech and language. Face is symmetric. Moving all extremities. No gross focal neurologic deficits are appreciated. Skin: Skin is warm, dry and intact. No rash noted. Psychiatric: Mood and affect are normal. Speech and behavior are  normal.  ____________________________________________   LABS (all labs ordered are listed, but only abnormal results are displayed)  Labs Reviewed  URINALYSIS, COMPLETE (UACMP) WITH MICROSCOPIC - Abnormal; Notable for the following components:      Result Value   Color, Urine YELLOW (*)    APPearance CLEAR (*)    Hgb urine dipstick MODERATE (*)    Protein, ur 100 (*)    Leukocytes,Ua TRACE (*)    Bacteria, UA RARE (*)    All other components within normal limits  BASIC METABOLIC PANEL - Abnormal; Notable for the following components:   Potassium 3.3 (*)    Glucose, Bld 135 (*)    Creatinine, Ser 1.63 (*)    GFR calc non Af Amer 34 (*)    GFR calc Af Amer 39 (*)    All other components within normal limits  CBC - Abnormal; Notable for the following components:   WBC 13.3 (*)    All other components within normal limits   ____________________________________________  EKG  none  ____________________________________________  RADIOLOGY  I have personally reviewed the images performed during this visit and I agree with the Radiologist's read.   Interpretation by Radiologist:  Ct Renal Stone Study  Result Date: 02/16/2019 CLINICAL DATA:  Right flank pain EXAM: CT ABDOMEN AND PELVIS WITHOUT CONTRAST TECHNIQUE: Multidetector CT imaging of the abdomen and pelvis was performed following the standard protocol without IV contrast. COMPARISON:  02/08/2018 FINDINGS: Lower chest: No acute abnormality. Hepatobiliary: Stable 1.5 cm left hepatic lobe cyst. Additional low-density lesion at the right hepatic dome, too small to characterize, but most likely represents cyst. No new hepatic lesion. Gallbladder surgically absent. No intrahepatic biliary dilatation. Pancreas: Unremarkable. No pancreatic ductal dilatation or surrounding inflammatory changes. Spleen: Normal in size without focal abnormality. Adrenals/Urinary Tract: Stable left adrenal adenoma. Right adrenal gland unremarkable.  Moderate right-sided hydroureteronephrosis with 2 mm calculus at the right ureterovesical junction. There is right sided urothelial and perinephric stranding suggesting acute obstruction. Scattered punctate intra cortical calcifications within the renal parenchyma bilaterally. Again seen are small probable hyperdense cysts. Left kidney is atrophic. Stomach/Bowel: Stomach is within normal limits. Appendix appears normal. No evidence of bowel wall thickening, distention, or inflammatory changes. Vascular/Lymphatic: No significant vascular findings are present. No enlarged abdominal or pelvic lymph nodes. Reproductive: Status post hysterectomy. No adnexal masses. Other: Small fat containing umbilical hernia. No ascites. Musculoskeletal: No acute  or significant osseous findings. IMPRESSION: 1. Moderate right-sided hydroureteronephrosis secondary to a 2 mm calculus at the right ureterovesical junction. 2. Additional chronic and/or incidental findings, as above. Electronically Signed   By: Davina Poke M.D.   On: 02/16/2019 15:56      ____________________________________________   PROCEDURES  Procedure(s) performed: None Procedures Critical Care performed:  None ____________________________________________   INITIAL IMPRESSION / ASSESSMENT AND PLAN / ED COURSE   63 y.o. female with history of kidney stones who presents for evaluation of sudden onset of sharp right flank pain associated with nausea and vomiting.  Patient is well-appearing in no distress, she is hypertensive in the setting of pain.  Exam shows no abdominal tenderness, no CVA tenderness, no pulsatile mass, strong intact pulses in all 4 extremities.  Differential diagnosis including kidney stone versus pyelonephritis versus appendicitis versus SBO.  Plan for CT, labs, urinalysis.  Will give morphine and Zofran for pain.  Will avoid NSAIDs since patient has only one functional kidney.    _________________________ 4:55 PM on 02/16/2019  -----------------------------------------  CT confirms a 2 mm ureteral stone in the right UVJ with moderate right-sided hydroureteronephrosis.  No overlying UTI.  Patient's pain is well controlled.  She received IV fluids, flomax, tylenol, and oxycodone. Discussed close f/u with Urology.  Discussed return precautions for pain that is not well controlled, nausea vomiting not well controlled, signs of a UTI.  Otherwise she is going to follow-up with her PCP or urology.    As part of my medical decision making, I reviewed the following data within the [facility] notes reviewed and incorporated, Labs reviewed , Old chart reviewed, Radiograph reviewed , Notes from prior ED visits and [place] Controlled Substance Database   Patient was evaluated in Emergency Department today for the symptoms described in the history of present illness. Patient was evaluated in the context of the global COVID-19 pandemic, which necessitated consideration that the patient might be at risk for infection with the SARS-CoV-2 virus that causes COVID-19. Institutional protocols and algorithms that pertain to the evaluation of patients at risk for COVID-19 are in a state of rapid change based on information released by regulatory bodies including the CDC and federal and state organizations. These policies and algorithms were followed during the patient's care in the ED.   ____________________________________________   FINAL CLINICAL IMPRESSION(S) / ED DIAGNOSES   Final diagnoses:  Kidney stone  Ureteral colic      NEW MEDICATIONS STARTED DURING THIS VISIT:  ED Discharge Orders         Ordered    oxyCODONE-acetaminophen (PERCOCET) 5-325 MG tablet  Every 4 hours PRN     02/16/19 1657    ondansetron (ZOFRAN ODT) 4 MG disintegrating tablet  Every 8 hours PRN     02/16/19 1657    tamsulosin (FLOMAX) 0.4 MG CAPS capsule  Daily     02/16/19 1657           Note:  This document was prepared  using Dragon voice recognition software and may include unintentional dictation errors.    Alfred Levins, [place], MD 02/16/19 [phone]

## 2019-03-11 ENCOUNTER — Other Ambulatory Visit: Payer: Self-pay | Admitting: Physician Assistant

## 2019-03-11 DIAGNOSIS — F3170 Bipolar disorder, currently in remission, most recent episode unspecified: Secondary | ICD-10-CM

## 2019-04-05 ENCOUNTER — Other Ambulatory Visit: Payer: Self-pay | Admitting: Physician Assistant

## 2019-04-05 DIAGNOSIS — F3170 Bipolar disorder, currently in remission, most recent episode unspecified: Secondary | ICD-10-CM

## 2019-04-10 ENCOUNTER — Ambulatory Visit (INDEPENDENT_AMBULATORY_CARE_PROVIDER_SITE_OTHER): Payer: 59 | Admitting: Physician Assistant

## 2019-04-10 ENCOUNTER — Encounter: Payer: Self-pay | Admitting: Physician Assistant

## 2019-04-10 DIAGNOSIS — T148XXA Other injury of unspecified body region, initial encounter: Secondary | ICD-10-CM | POA: Diagnosis not present

## 2019-04-10 DIAGNOSIS — F3170 Bipolar disorder, currently in remission, most recent episode unspecified: Secondary | ICD-10-CM

## 2019-04-10 MED ORDER — VENLAFAXINE HCL 75 MG PO TABS: 75.0000 mg | ORAL_TABLET | Freq: Three times a day (TID) | ORAL | 1 refills | Status: DC

## 2019-04-10 NOTE — Patient Instructions (Signed)
Hematoma A hematoma is a collection of blood under the skin, in an organ, in a body space, in a joint space, or in other tissue. The blood can thicken (clot) to form a lump that you can see and feel. The lump is often firm and may become sore and tender. Most hematomas get better in a few days to weeks. However, some hematomas may be serious and require medical care. Hematomas can range from very small to very large. What are the causes? This condition is caused by:  A blunt or penetrating injury.  A leakage from a blood vessel under the skin.  Some medical procedures, including surgeries, such as oral surgery, face lifts, and surgeries on the joints.  Some medical conditions that cause bleeding or bruising. There may be multiple hematomas that appear in different areas of the body. What increases the risk? You are more likely to develop this condition if:  You are an older adult.  You use blood thinners. What are the signs or symptoms?  Symptoms of this condition depend on where the hematoma is located.  Common symptoms of a hematoma that is under the skin include:  A firm lump on the body.  Pain and tenderness in the area.  Bruising. Blue, dark blue, purple-red, or yellowish skin (discoloration) may appear at the site of the hematoma if the hematoma is close to the surface of the skin. Common symptoms of a hematoma that is deep in the tissues or body spaces may be less obvious. They include:  A collection of blood in the stomach (intra-abdominal hematoma). This may cause pain in the abdomen, weakness, fainting, and shortness of breath.  A collection of blood in the head (intracranial hematoma). This may cause a headache or symptoms such as weakness, trouble speaking or understanding, or a change in consciousness. How is this diagnosed? This condition is diagnosed based on:  Your medical history.  A physical exam.  Imaging tests, such as an ultrasound or CT scan. These may  be needed if your health care provider suspects a hematoma in deeper tissues or body spaces.  Blood tests. These may be needed if your health care provider believes that the hematoma is caused by a medical condition. How is this treated? Treatment for this condition depends on the cause, size, and location of the hematoma. Treatment may include:  Doing nothing. The majority of hematomas do not need treatment as many of them go away on their own over time.  Surgery or close monitoring. This may be needed for large hematomas or hematomas that affect vital organs.  Medicines. Medicines may be given if there is an underlying medical cause for the hematoma. Follow these instructions at home: Managing pain, stiffness, and swelling   If directed, put ice on the affected area. ? Put ice in a plastic bag. ? Place a towel between your skin and the bag. ? Leave the ice on for 20 minutes, 2-3 times a day for the first couple of days.  If directed, apply heat to the affected area after applying ice for a couple of days. Use the heat source that your health care provider recommends, such as a moist heat pack or a heating pad. ? Place a towel between your skin and the heat source. ? Leave the heat on for 20-30 minutes. ? Remove the heat if your skin turns bright red. This is especially important if you are unable to feel pain, heat, or cold. You may have a greater   risk of getting burned.  Raise (elevate) the affected area above the level of your heart while you are sitting or lying down.  If told, wrap the affected area with an elastic bandage. The bandage applies pressure (compression) to the area, which may help to reduce swelling and promote healing. Do not wrap the bandage too tightly around the affected area.  If your hematoma is on a leg or foot (lower extremity) and is painful, your health care provider may recommend crutches. Use them as told by your health care provider. General instructions   Take over-the-counter and prescription medicines only as told by your health care provider.  Keep all follow-up visits as told by your health care provider. This is important. Contact a health care provider if:  You have a fever.  The swelling or discoloration gets worse.  You develop more hematomas. Get help right away if:  Your pain is worse or your pain is not controlled with medicine.  Your skin over the hematoma breaks or starts bleeding.  Your hematoma is in your chest or abdomen and you have weakness, shortness of breath, or a change in consciousness.  You have a hematoma on your scalp that is caused by a fall or injury, and you also have: ? A headache that gets worse. ? Trouble speaking or understanding speech. ? Weakness. ? Change in alertness or consciousness. Summary  A hematoma is a collection of blood under the skin, in an organ, in a body space, in a joint space, or in other tissue.  This condition usually does not need treatment because many hematomas go away on their own over time.  Large hematomas, or those that may affect vital organs, may need surgical drainage or monitoring. If the hematoma is caused by a medical condition, medicines may be prescribed.  Get help right away if your hematoma breaks or starts to bleed, you have shortness of breath, or you have a headache or trouble speaking after a fall. This information is not intended to replace advice given to you by your health care provider. Make sure you discuss any questions you have with your health care provider. Document Released: 12/03/2003 Document Revised: 09/23/2017 Document Reviewed: 09/23/2017 Elsevier Patient Education  2020 [employer]  
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
## 2019-04-21 ENCOUNTER — Other Ambulatory Visit: Payer: Self-pay | Admitting: Physician Assistant

## 2019-04-21 DIAGNOSIS — F5101 Primary insomnia: Secondary | ICD-10-CM

## 2019-04-21 MED ORDER — ZOLPIDEM TARTRATE 10 MG PO TABS: 10.0000 mg | ORAL_TABLET | Freq: Every evening | ORAL | 1 refills | Status: DC | PRN

## 2019-04-21 NOTE — Telephone Encounter (Signed)
Pt would like a refill on zolpidem [facility] [address] in [place]

## 2019-05-19 ENCOUNTER — Other Ambulatory Visit: Payer: Self-pay | Admitting: Physician Assistant

## 2019-05-19 DIAGNOSIS — F5101 Primary insomnia: Secondary | ICD-10-CM

## 2019-05-21 NOTE — Telephone Encounter (Signed)
Requested medication (s) are due for refill today:  yes  Requested medication (s) are on the active medication list:  yes  Future visit scheduled:  No  Last Refill: 06/08/18; #90; RFx3  Requested Prescriptions  Pending Prescriptions Disp Refills   traZODone (DESYREL) 100 MG tablet [Pharmacy Med Name: TRAZODONE 100 MG TABLET] 90 tablet 3    Sig: TAKE 1 TABLET (100 MG TOTAL) BY MOUTH AT BEDTIME AS NEEDED FOR SLEEP.      Psychiatry: Antidepressants - Serotonin Modulator Failed - 05/19/2019  8:17 PM      Failed - Completed PHQ-2 or PHQ-9 in the last 360 days.      Failed - Valid encounter within last 6 months    Recent Outpatient Visits           1 month ago Hematoma   [facility] [place], [place], [place]   6 months ago Annual physical exam   [facility] [place], [place]   9 months ago Non-recurrent acute suppurative otitis media of right ear without spontaneous rupture of tympanic membrane   [place], [place]   9 months ago Acute non-recurrent pansinusitis   [facility] [place], [place], [place]   1 year ago [place], [place], [place]

## 2019-07-11 ENCOUNTER — Ambulatory Visit: Payer: Self-pay | Admitting: *Deleted

## 2019-07-11 ENCOUNTER — Ambulatory Visit: Payer: Self-pay | Admitting: Physician Assistant

## 2019-07-11 NOTE — Telephone Encounter (Signed)
Per initial encounter, "Pt is calling and request to see jenni tomorrow. Tawanna Sat is not in the office. Pt is having constipation for about 9 days. Pt needs advise."; contacted pt regarding her symptoms; the pt states she has take stool softeners, OTC laxatives; her last normal BM was 07/02/19; she did have 1 episode of emesis after eating 07/07/19; she says her abdomen is hard and more swollen than usual; the pt says that she normally has a BM twice weekly due to her medications; recommendations made per nurse triage protocol; the pt verbalized understanding; she normally sees Stephanie Ball, Stephanie Ball; algorithm completed; pt offered and accepted appt with Stephanie Ball, 07/12/19 at 1120; will route to office for notification.   Reason for Disposition . Last bowel movement (BM) > 4 days ago  Answer Assessment - Initial Assessment Questions 1. STOOL PATTERN OR FREQUENCY: "How often do you pass bowel movements (BMs)?"  (Normal range: tid to q 3 days)  "When was the last BM passed?"       Twice weekly normally; last BM 07/02/19 2. STRAINING: "Do you have to strain to have a BM?"     yes 3. RECTAL PAIN: "Does your rectum hurt when the stool comes out?" If so, ask: "Do you have hemorrhoids? How bad is the pain?"  (Scale 1-10; or mild, moderate, severe)    no 4. STOOL COMPOSITION: "Are the stools hard?"      yes 5. BLOOD ON STOOLS: "Has there been any blood on the toilet tissue or on the surface of the BM?" If so, ask: "When was the last time?"     no 6. CHRONIC CONSTIPATION: "Is this a new problem for you?"  If no, ask: "How long have you had this problem?" (days, weeks, months)    No; laxatives normally work 7. CHANGES IN DIET: "Have there been any recent changes in your diet?"      no 8. MEDICATIONS: "Have you been taking any new medications?"     no 9. LAXATIVES: "Have you been using any laxatives or enemas?"  If yes, ask "What, how often, and when was the last time?"   OTC laxatives; not  sure of name; last dose 07/10/19 10. CAUSE: "What do you think is causing the constipation?"       "pills I take" 11. OTHER SYMPTOMS: "Do you have any other symptoms?" (Stephanieg., abdominal pain, fever, vomiting)     Nausea, upset stomach, bloating 12. PREGNANCY: "Is there any chance you are pregnant?" "When was your last menstrual period?"       no  Protocols used: CONSTIPATION-A-AH

## 2019-07-11 NOTE — Progress Notes (Deleted)
Patient: Stephanie Ball Female    DOB: 02-10-1956   64 y.o.   MRN: [id number] Visit Date: 07/11/2019  Today's Provider: Trinna Post, PA-C   No chief complaint on file.  Subjective:     Constipation     Allergies  Allergen Reactions  . Aciphex  [Rabeprazole Sodium]   . Amoxicillin-Pot Clavulanate   . Iron   . Macrobid  [employer] Macro]   . Metoprolol Other (See Comments)    After taking medication for about 72 hours she noted elevated systolic BP and swelling of hands and feet and discontinued.     Current Outpatient Medications:  .  aspirin EC 81 MG tablet, Take 81 mg by mouth daily., Disp: , Rfl:  .  busPIRone (BUSPAR) 15 MG tablet, TAKE 1 TABLET (15 MG TOTAL) BY MOUTH 2 (TWO) TIMES DAILY., Disp: 180 tablet, Rfl: 3 .  clotrimazole-betamethasone (LOTRISONE) cream, APPLY TOPICALLY 2 TIMES DAILY, Disp: 30 g, Rfl: 5 .  colchicine 0.6 MG tablet, Take 2 tabs PO at onset of symptoms, and one tab PO daily until symptoms resolve. Repeat as needed, Disp: 90 tablet, Rfl: 1 .  hydrochlorothiazide (HYDRODIURIL) 12.5 MG tablet, TAKE 1 TABLET BY MOUTH EVERY DAY, Disp: 90 tablet, Rfl: 3 .  lisinopril (ZESTRIL) 40 MG tablet, TAKE 1 TABLET BY MOUTH EVERY DAY, Disp: 90 tablet, Rfl: 3 .  lovastatin (MEVACOR) 40 MG tablet, TAKE 1 TABLET BY MOUTH EVERYDAY AT BEDTIME, Disp: 90 tablet, Rfl: 3 .  montelukast (SINGULAIR) 10 MG tablet, TAKE 1 TABLET BY MOUTH EVERY DAY, Disp: 90 tablet, Rfl: 3 .  OLANZapine (ZYPREXA) 15 MG tablet, TAKE 1 TABLET BY MOUTH EVERY DAY, Disp: 90 tablet, Rfl: 3 .  ondansetron (ZOFRAN ODT) 4 MG disintegrating tablet, Take 1 tablet (4 mg total) by mouth every 8 (eight) hours as needed., Disp: 20 tablet, Rfl: 0 .  oxyCODONE-acetaminophen (PERCOCET) 5-325 MG tablet, Take 1 tablet by mouth every 4 (four) hours as needed. (Patient not taking: Reported on 04/10/2019), Disp: 12 tablet, Rfl: 0 .  traZODone (DESYREL) 100 MG tablet, TAKE 1 TABLET (100 MG TOTAL)  BY MOUTH AT BEDTIME AS NEEDED FOR SLEEP., Disp: 90 tablet, Rfl: 3 .  venlafaxine (EFFEXOR) 75 MG tablet, Take 1 tablet (75 mg total) by mouth 3 (three) times daily with meals., Disp: 270 tablet, Rfl: 1 .  Vitamin D, Ergocalciferol, (DRISDOL) 1.25 MG (50000 UT) CAPS capsule, TAKE 1 CAPSULE (50,000 UNITS TOTAL) BY MOUTH EVERY 7 (SEVEN) DAYS., Disp: 12 capsule, Rfl: 3 .  zolpidem (AMBIEN) 10 MG tablet, Take 1 tablet (10 mg total) by mouth at bedtime as needed for sleep., Disp: 90 tablet, Rfl: 1  Review of Systems  Gastrointestinal: Positive for constipation.    Social History   Tobacco Use  . Smoking status: Current Every Day Smoker  . Smokeless tobacco: Never Used  . Tobacco comment: would like to quit; as stated pt smoked 1/2 PPd for 25 years and now is smoking 5-6 cigarettes per day.  Substance Use Topics  . Alcohol use: No      Objective:   There were no vitals taken for this visit. There were no vitals filed for this visit.There is no height or weight on file to calculate BMI.   Physical Exam   No results found for any visits on 07/11/19.     Assessment & [place], PA-C  [facility]

## 2019-07-12 ENCOUNTER — Ambulatory Visit: Payer: 59 | Admitting: Physician Assistant

## 2019-07-12 ENCOUNTER — Encounter: Payer: Self-pay | Admitting: Physician Assistant

## 2019-07-12 ENCOUNTER — Other Ambulatory Visit: Payer: Self-pay

## 2019-07-12 VITALS — BP 118/74 | HR 83 | Temp 96.9°F | Wt 170.6 lb

## 2019-07-12 DIAGNOSIS — K5900 Constipation, unspecified: Secondary | ICD-10-CM

## 2019-07-12 NOTE — Patient Instructions (Signed)

## 2019-07-12 NOTE — Progress Notes (Signed)
Patient: Stephanie Ball Female    DOB: Oct 14, 1955   64 y.o.   MRN: [id number] Visit Date: 07/12/2019  Today's Provider: Trinna Post, PA-C   Chief Complaint  Patient presents with  . Constipation   Subjective:    Prior to this episode with history of constipation, went 2 times a week at baseline. She on a routine basis takes stool softeners. She added laxatives with most recent difficulty having a bowel movement. She reports she hasn't had a bowel movement in 9 days but did have some small bowel movement yesterday. She did get a fleet enema but did not use this because she read you shouldn't use it with chronic kidney disease. She is passing gas. She did vomit once but other than that she has had nausea. Denies fevers, blood in stool.   Constipation This is a new problem. The current episode started in the past 7 days. The problem is unchanged. The stool is described as firm. Associated symptoms include abdominal pain. Pertinent negatives include no back pain, diarrhea, fecal incontinence, nausea or vomiting. She has tried laxatives and stool softeners for the symptoms. The treatment provided no relief.    Allergies  Allergen Reactions  . Aciphex  [Rabeprazole Sodium]   . Amoxicillin-Pot Clavulanate   . Iron   . Macrobid  [employer] Macro]   . Metoprolol Other (See Comments)    After taking medication for about 72 hours she noted elevated systolic BP and swelling of hands and feet and discontinued.     Current Outpatient Medications:  .  aspirin EC 81 MG tablet, Take 81 mg by mouth daily., Disp: , Rfl:  .  busPIRone (BUSPAR) 15 MG tablet, TAKE 1 TABLET (15 MG TOTAL) BY MOUTH 2 (TWO) TIMES DAILY., Disp: 180 tablet, Rfl: 3 .  clotrimazole-betamethasone (LOTRISONE) cream, APPLY TOPICALLY 2 TIMES DAILY, Disp: 30 g, Rfl: 5 .  colchicine 0.6 MG tablet, Take 2 tabs PO at onset of symptoms, and one tab PO daily until symptoms resolve. Repeat as needed, Disp: 90  tablet, Rfl: 1 .  hydrochlorothiazide (HYDRODIURIL) 12.5 MG tablet, TAKE 1 TABLET BY MOUTH EVERY DAY, Disp: 90 tablet, Rfl: 3 .  lisinopril (ZESTRIL) 40 MG tablet, TAKE 1 TABLET BY MOUTH EVERY DAY, Disp: 90 tablet, Rfl: 3 .  lovastatin (MEVACOR) 40 MG tablet, TAKE 1 TABLET BY MOUTH EVERYDAY AT BEDTIME, Disp: 90 tablet, Rfl: 3 .  montelukast (SINGULAIR) 10 MG tablet, TAKE 1 TABLET BY MOUTH EVERY DAY, Disp: 90 tablet, Rfl: 3 .  OLANZapine (ZYPREXA) 15 MG tablet, TAKE 1 TABLET BY MOUTH EVERY DAY, Disp: 90 tablet, Rfl: 3 .  ondansetron (ZOFRAN ODT) 4 MG disintegrating tablet, Take 1 tablet (4 mg total) by mouth every 8 (eight) hours as needed., Disp: 20 tablet, Rfl: 0 .  oxyCODONE-acetaminophen (PERCOCET) 5-325 MG tablet, Take 1 tablet by mouth every 4 (four) hours as needed., Disp: 12 tablet, Rfl: 0 .  traZODone (DESYREL) 100 MG tablet, TAKE 1 TABLET (100 MG TOTAL) BY MOUTH AT BEDTIME AS NEEDED FOR SLEEP., Disp: 90 tablet, Rfl: 3 .  venlafaxine (EFFEXOR) 75 MG tablet, Take 1 tablet (75 mg total) by mouth 3 (three) times daily with meals., Disp: 270 tablet, Rfl: 1 .  Vitamin D, Ergocalciferol, (DRISDOL) 1.25 MG (50000 UT) CAPS capsule, TAKE 1 CAPSULE (50,000 UNITS TOTAL) BY MOUTH EVERY 7 (SEVEN) DAYS., Disp: 12 capsule, Rfl: 3 .  zolpidem (AMBIEN) 10 MG tablet, Take 1 tablet (10 mg total)  by mouth at bedtime as needed for sleep., Disp: 90 tablet, Rfl: 1  Review of Systems  Gastrointestinal: Positive for abdominal pain and constipation. Negative for diarrhea, nausea and vomiting.  Musculoskeletal: Negative for back pain.    Social History   Tobacco Use  . Smoking status: Current Every Day Smoker  . Smokeless tobacco: Never Used  . Tobacco comment: would like to quit; as stated pt smoked 1/2 PPd for 25 years and now is smoking 5-6 cigarettes per day.  Substance Use Topics  . Alcohol use: No      Objective:   BP 118/74 (BP Location: Right Arm, Patient Position: Sitting, Cuff Size: Normal)    Pulse 83   Temp (!) 96.9 F (36.1 C) (Temporal)   Wt 170 lb 9.6 oz (77.4 kg)   SpO2 96%   BMI 30.22 kg/m  Vitals:   07/12/19 1106  BP: 118/74  Pulse: 83  Temp: (!) 96.9 F (36.1 C)  TempSrc: Temporal  SpO2: 96%  Weight: 170 lb 9.6 oz (77.4 kg)  Body mass index is 30.22 kg/m.   Physical Exam Constitutional:      Appearance: Normal appearance.  Cardiovascular:     Rate and Rhythm: Normal rate and regular rhythm.     Heart sounds: Normal heart sounds.  Pulmonary:     Effort: Pulmonary effort is normal.     Breath sounds: Normal breath sounds.  Abdominal:     General: Bowel sounds are normal. There is no distension.     Palpations: Abdomen is soft.     Tenderness: There is no abdominal tenderness.  Skin:    General: Skin is warm and dry.  Neurological:     Mental Status: She is alert and oriented to person, place, and time. Mental status is at baseline.  Psychiatric:        Mood and Affect: Mood normal.        Behavior: Behavior normal.      No results found for any visits on 07/12/19.     Assessment & Plan    1. Constipation, unspecified constipation type  There is a precaution about using Fleet enemas if CrCl < 30 mL/min. Calculated her CrCl to be 44 mL/min. Counseled we could prescribe her a colonoscopy type clean out but she would rather try the enema first. Counseled this can sometimes clear stool distally but some might remain proximally. Samples of Linzess given for chronic constipation after she does clean out. Patient declines abdominal xray today. Return precautions counseled.   The entirety of the information documented in the History of Present Illness, Review of Systems and Physical Exam were personally obtained by me. Portions of this information were initially documented by [facility] and reviewed by me for thoroughness and accuracy.   F/u PRN      Trinna Post, PA-C  [facility]

## 2019-07-16 IMAGING — CT CT RENAL STONE PROTOCOL
2 of 4 series · 15 of 46 positions shown, 17 images · non-contrast
Comparison: None.

CLINICAL DATA: Right flank pain with hematuria

EXAM:
CT ABDOMEN AND PELVIS WITHOUT CONTRAST
TECHNIQUE: Multidetector CT imaging of the abdomen and pelvis was performed
following the standard protocol without oral or IV contrast.

[Series 2: renal stone · axial · 0.72mm/px · z∈[-1621,-1251]mm · 12 of 85 slices shown, 14 images (1 of 2)]
[im 7/85  soft-tissue]
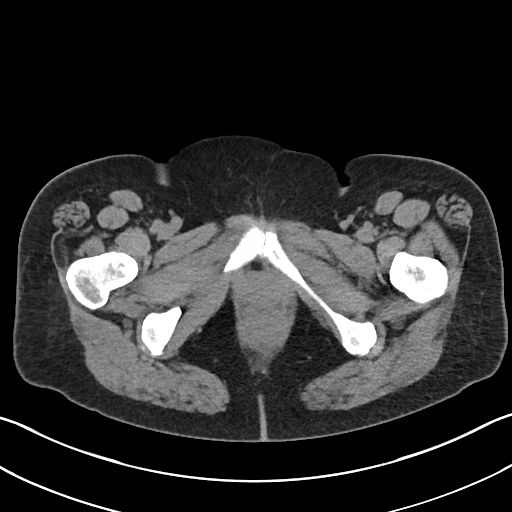
[im 7/85  bone]
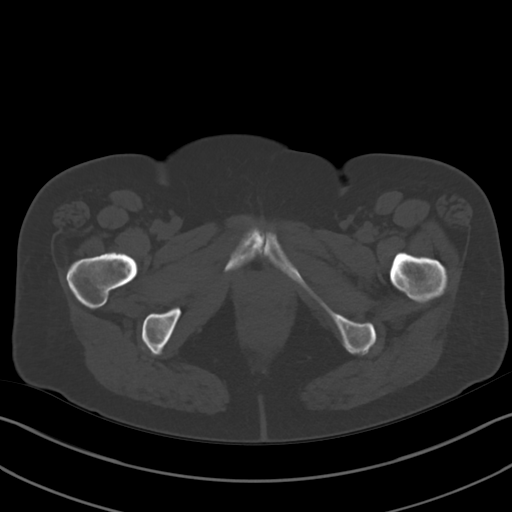
[im 14/85  soft-tissue]
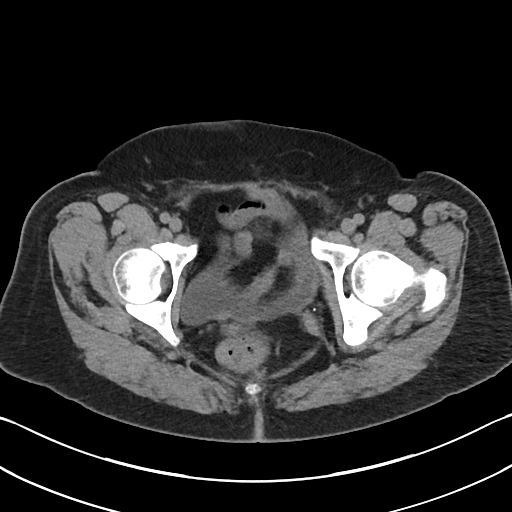
[im 21/85  soft-tissue]
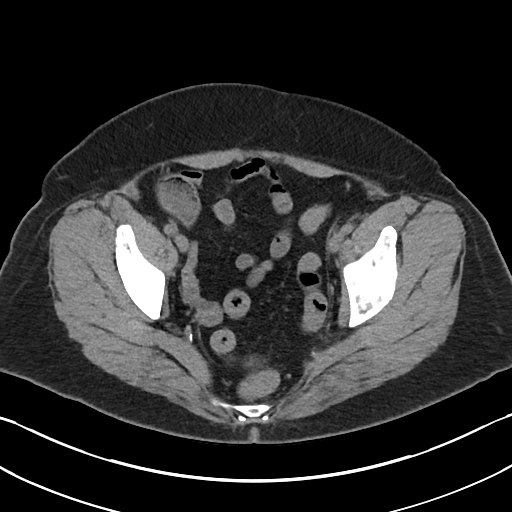
[im 27/85  soft-tissue]
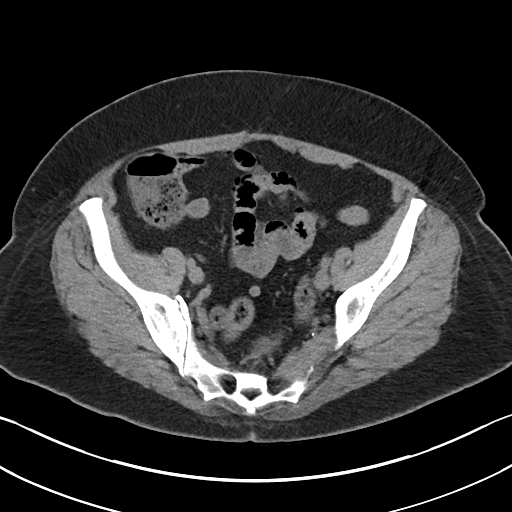
[im 34/85  soft-tissue]
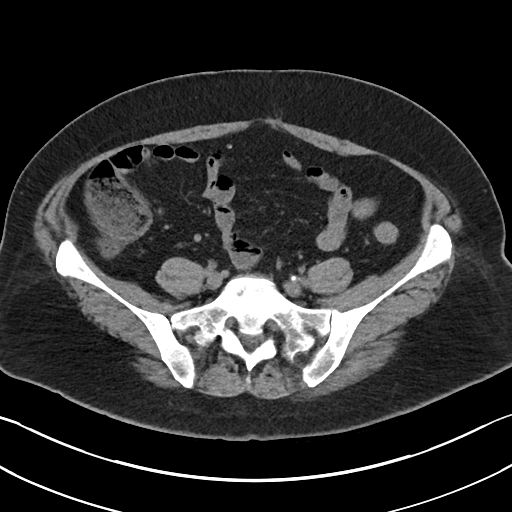
[im 41/85  soft-tissue]
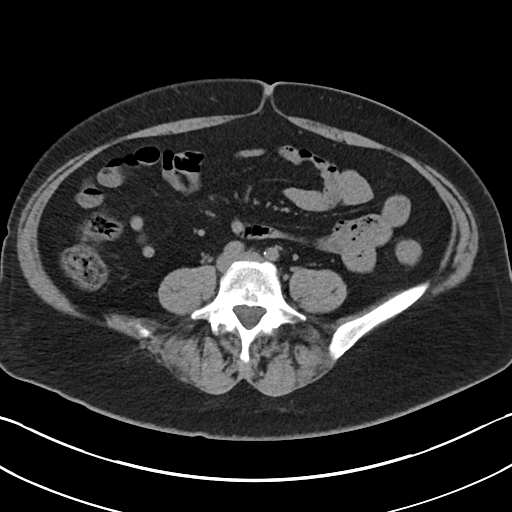
[im 48/85  soft-tissue]
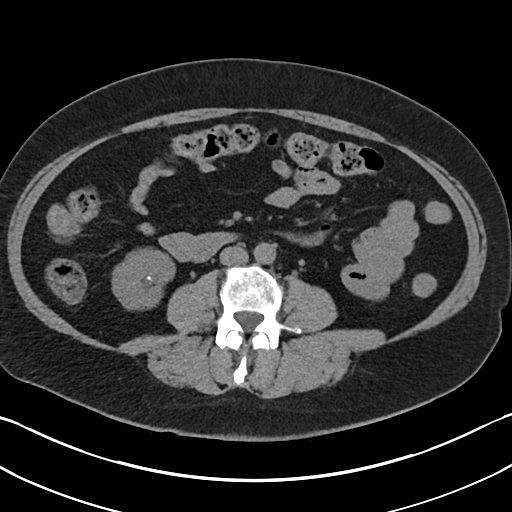
[im 54/85  soft-tissue]
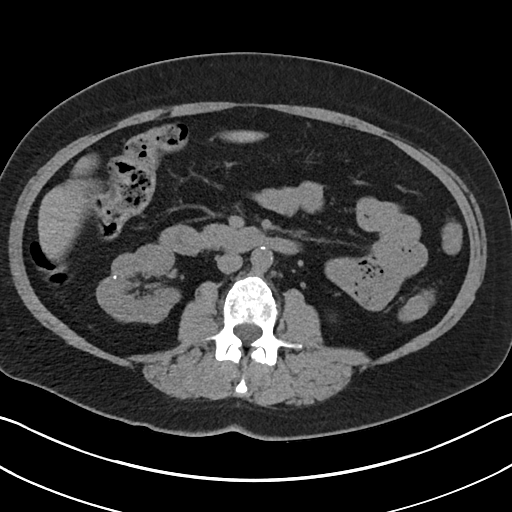
[im 61/85  soft-tissue]
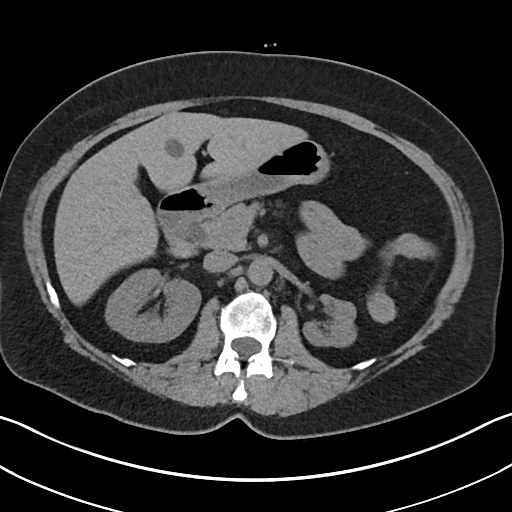
[im 61/85  bone]
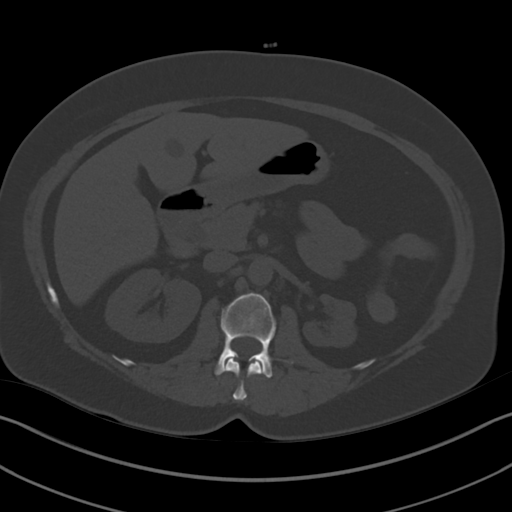
[im 68/85  soft-tissue]
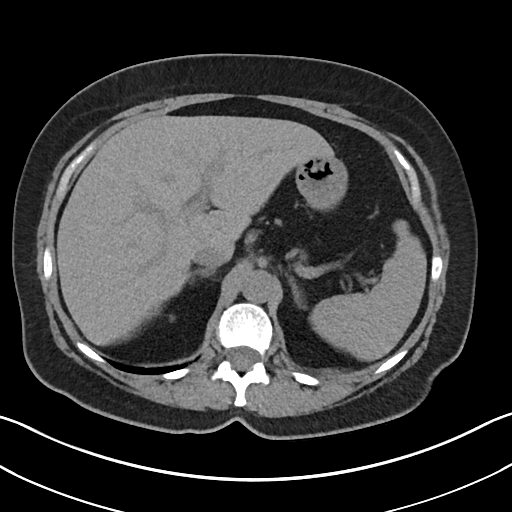
[im 74/85  soft-tissue]
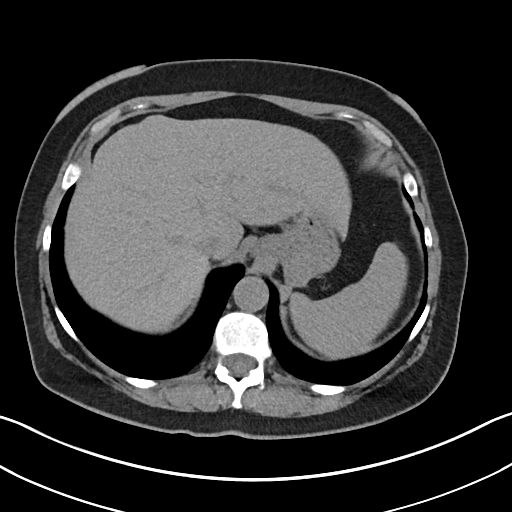
[im 81/85  soft-tissue]
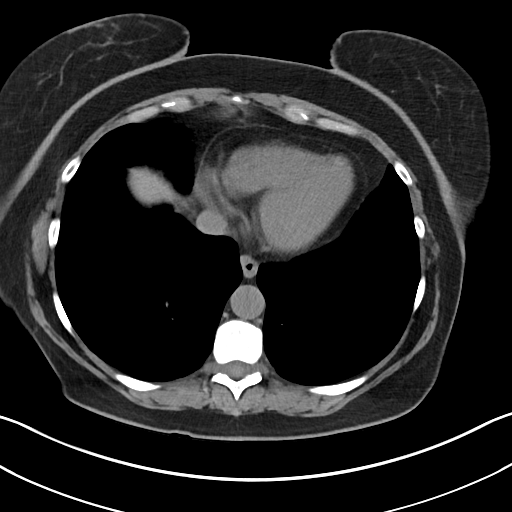

[Series 4: renal stone · coronal · 0.73mm/px · 3 of 146 slices shown (2 of 2)]
[im 49/146  soft-tissue]
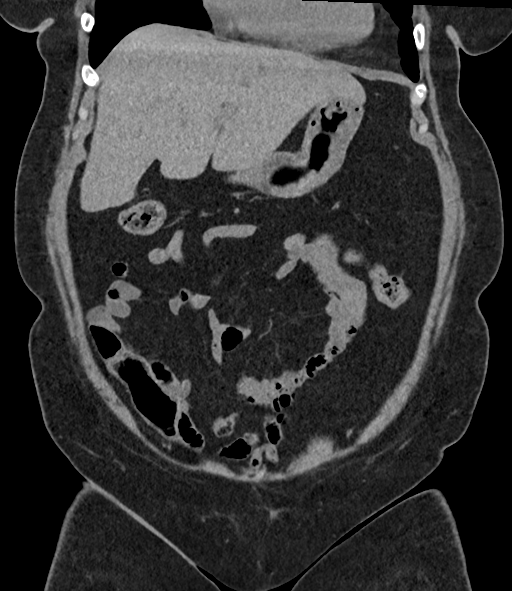
[im 65/146  soft-tissue]
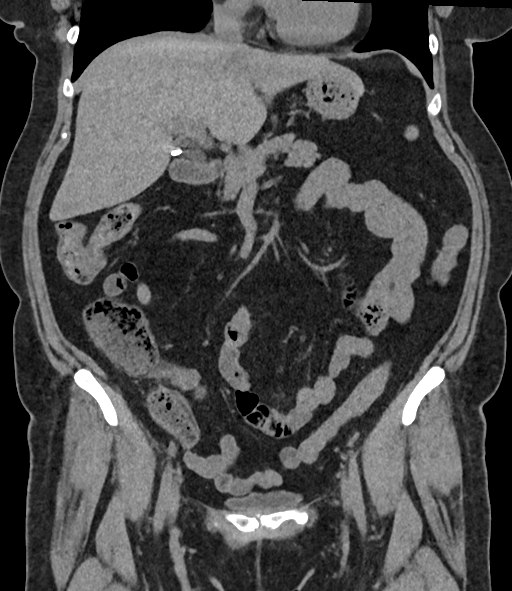
[im 81/146  soft-tissue]
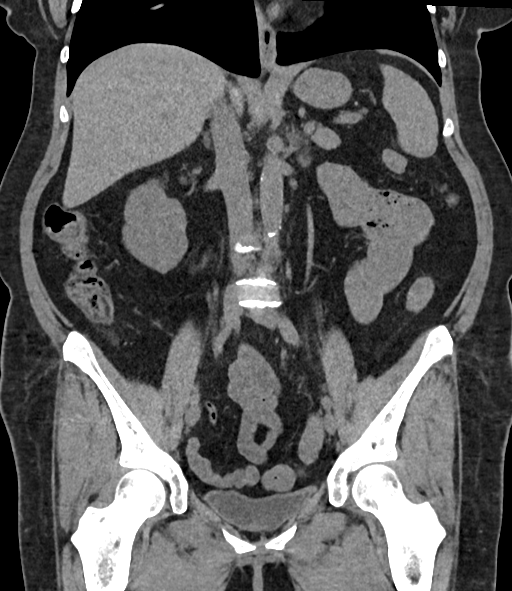

[15 of 46 positions shown; findings below may reference images not displayed]

FINDINGS: Lower chest: There is no lung base edema or consolidation. On axial
slice 7 series 3, there is a 4 mm nodular opacity with a nearby 3 mm
nodular opacity, both in the medial segment of the right middle
lobe.

Hepatobiliary: There is a cyst in the anterior segment of the right
lobe of the liver measuring 1.4 x 1.3 cm. There is a 5 mm cyst in
the dome of the liver on the right. No other liver lesions are
appreciable on this noncontrast enhanced study. The gallbladder is
absent. There is no appreciable biliary duct dilatation.

Pancreas: There is no evident pancreatic mass or inflammatory focus.

Spleen: No splenic lesions are appreciable on this noncontrast
enhanced study.

Adrenals/Urinary Tract: Right adrenal appears normal. There is an
apparent adenoma in the left adrenal measuring 1.8 x 1.0 cm. The
left kidney is atrophic. There is apparent compensatory hypertrophy
on the right. There is scarring in both kidneys. There are
subcentimeter probable hyperdense cysts on the right. There is a
cyst in the periphery of the upper pole of the right kidney
measuring 0.9 x 0.9 cm. There are calcifications in the periphery of
each kidney which may be secondary to the underlying scarring. The
largest of these calcifications on the left measures 7 mm. The
largest of these calcifications on the right measures 7 mm as well.
There is a 2 mm calculus in the lower pole right kidney. There are
scattered tiny calculi in the right kidney. There is no evident
ureteral calculus on either side. Urinary bladder is midline with
wall thickness within normal limits.

Stomach/Bowel: There is no appreciable bowel wall or mesenteric
thickening. There is no evident bowel obstruction. There is no free
air or portal venous air.

Vascular/Lymphatic: There is aortoiliac atherosclerosis. No evident
aneurysm. Major mesenteric arterial vessels appear patent on this
noncontrast enhanced study. There is no appreciable adenopathy in
the abdomen or pelvis.

Reproductive: Uterus is absent.  There is no evident pelvic mass.

Other: Appendix appears normal. There is no abscess or ascites in
the abdomen or pelvis. There is a small ventral hernia containing
only fat.

Musculoskeletal: There are no blastic or lytic bone lesions. No
intramuscular lesions are evident.
IMPRESSION: 1. Atrophic left kidney with compensatory hypertrophy right kidney.
Scarring in each kidney with several peripheral calcifications,
likely secondary to previous inflammation/scarring. There is a 2 mm
nonobstructing calculus in the lower pole the right kidney. No
hydronephrosis or ureteral calculus on either side. There are
subcentimeter probable hyperdense cysts on the right.

2. No evident bowel obstruction. No abscess in the abdomen or
pelvis. Appendix region appears normal.

3.  There is aortoiliac atherosclerosis.

4.  Gallbladder and uterus absent.

5.  Small ventral hernia containing only fat.

6. Nodular opacities in the inferior aspect of the medial segment
right middle lobe, largest measuring 4 mm. No follow-up needed if
patient is low-risk (and has no known or suspected primary
neoplasm). Non-contrast chest CT can be considered in 12 months if
patient is high-risk. This recommendation follows the consensus
statement: Guidelines for Management of Incidental Pulmonary Nodules
Detected on CT Images: From the [HOSPITAL] 2617; Radiology
2617; [DATE].

7.  Benign left adrenal adenoma.

Aortic Atherosclerosis ([id number]).

## 2019-07-21 ENCOUNTER — Other Ambulatory Visit: Payer: Self-pay | Admitting: Physician Assistant

## 2019-07-21 DIAGNOSIS — F319 Bipolar disorder, unspecified: Secondary | ICD-10-CM

## 2019-07-21 NOTE — Telephone Encounter (Signed)
Requested  medications are  due for refill today yes  Requested medications are on the active medication list yes  Last refill 12/21  Future visit scheduled no  Notes to clinic Not delegated

## 2019-07-21 NOTE — Telephone Encounter (Signed)
LOV   07/12/2019 Acute-[facility] LOV  04/10/2019 Jenni  LRF  07/15/2018  # 90 x 3

## 2019-07-22 ENCOUNTER — Other Ambulatory Visit: Payer: Self-pay | Admitting: Physician Assistant

## 2019-07-22 DIAGNOSIS — E559 Vitamin D deficiency, unspecified: Secondary | ICD-10-CM

## 2019-07-22 NOTE — Telephone Encounter (Signed)
Requested medication (s) are due for refill today: yes  Requested medication (s) are on the active medication list: yes  Last refill:  10/19/18  Future visit scheduled: No  Notes to clinic:  medication not delegated to NT to refill    Requested Prescriptions  Pending Prescriptions Disp Refills   Vitamin D, Ergocalciferol, (DRISDOL) 1.25 MG (50000 UNIT) CAPS capsule [Pharmacy Med Name: VITAMIN D2 1.25MG (50,000 UNIT)] 12 capsule 3    Sig: TAKE 1 CAPSULE (50,000 UNITS TOTAL) BY MOUTH EVERY 7 (SEVEN) DAYS.      Endocrinology:  Vitamins - Vitamin D Supplementation Failed - 07/22/2019 10:15 AM      Failed - 50,000 IU strengths are not delegated      Failed - Phosphate in normal range and within 360 days    No results found for: PHOS        Passed - Ca in normal range and within 360 days    Calcium  Date Value Ref Range Status  02/16/2019 10.2 8.9 - 10.3 mg/dL Final          Passed - Vitamin D in normal range and within 360 days    Vit D, 25-Hydroxy  Date Value Ref Range Status  11/09/2018 52.6 30.0 - 100.0 ng/mL Final    Comment:    Vitamin D deficiency has been defined by the Institute of Medicine and an Endocrine Society practice guideline as a level of serum 25-OH vitamin D less than 20 ng/mL (1,2). The Endocrine Society went on to further define vitamin D insufficiency as a level between 21 and 29 ng/mL (2). 1. IOM (Institute of Medicine). 2010. Dietary reference    intakes for calcium and D. [place]: The    [employer]. 2. Holick MF, Binkley Tama, Bischoff-Ferrari HA, et al.    Evaluation, treatment, and prevention of vitamin D    deficiency: an Endocrine Society clinical practice    guideline. JCEM. 2011 Jul; 96(7):1911-30.           Passed - Valid encounter within last 12 months    Recent Outpatient Visits           1 week ago Constipation, unspecified constipation type   [place], [place], [place]   3 months ago  Hematoma   [place], [place]   8 months ago Annual physical exam   [place], [place]   11 months ago Non-recurrent acute suppurative otitis media of right ear without spontaneous rupture of tympanic membrane   [place], [place]   11 months ago Acute non-recurrent pansinusitis   [facility] [place], [place], [place]

## 2019-07-28 ENCOUNTER — Ambulatory Visit: Payer: 59 | Attending: Internal Medicine

## 2019-07-28 DIAGNOSIS — Z23 Encounter for immunization: Secondary | ICD-10-CM

## 2019-07-28 NOTE — Progress Notes (Signed)
   Covid-19 Vaccination Clinic  Name:  Tearria Mcdaniel    MRN: [id number] DOB: 10-18-1955  07/28/2019  Ms. Rosal was observed post Covid-19 immunization for 15 minutes without incident. She was provided with Vaccine Information Sheet and instruction to access the V-Safe system.   Ms. Rindler was instructed to call 911 with any severe reactions post vaccine: Marland Kitchen Difficulty breathing  . Swelling of face and throat  . A fast heartbeat  . A bad rash all over body  . Dizziness and weakness   Immunizations Administered    Name Date Dose VIS Date Route   Pfizer COVID-19 Vaccine 07/28/2019 10:43 AM 0.3 mL 04/14/2019 Intramuscular   Manufacturer: [place]   Lot: [id number]   [place]: [id number]

## 2019-08-17 DIAGNOSIS — K5900 Constipation, unspecified: Secondary | ICD-10-CM | POA: Insufficient documentation

## 2019-08-17 NOTE — Progress Notes (Signed)
Established patient visit     Patient: Stephanie Ball   DOB: 03-20-1956   64 y.o. Female  MRN: [id number] Visit Date: 08/18/2019  Today's healthcare provider: Mar Daring, PA-C  Subjective:    Chief Complaint  Patient presents with  . Constipation   HPI  Patient here with c/o constipation.This is a chronic issue She has abdominal pain with it. Last bowel movement was this Wednesday. She has done laxatives. Per last office visit samples of Linzess given for chronic constipation after she does clean out. Patient declined abdominal xray per last office visit . Reports that the Linzess samples that she was given helped a little but feels that she needs a stronger dose. She is drinking fluids.    Patient Active Problem List   Diagnosis Date Noted  . Constipation 08/17/2019  . Acute gouty arthritis 02/10/2016  . Insomnia 12/07/2014  . Anxiety 12/06/2014  . Allergic rhinitis 12/06/2014  . 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 Abnormal LFTs 12/06/2014  . Bloodgood disease 12/06/2014  . History of hydronephrosis 12/06/2014  . Blood glucose elevated 12/06/2014  . Decreased potassium in the blood 12/06/2014  . Arterial blood pressure decreased 12/06/2014  . History of panic attacks 12/06/2014  . Breath shortness 12/06/2014  . Abnormal mammogram 12/06/2014  . Chronic kidney disease (CKD), stage II (mild) 08/29/2012  . History of metabolic disorder [id number]  . Kidney cysts 08/29/2012  . Calcium blood increased 07/19/2009  . Avitaminosis D 02/07/2009  . Hypercholesterolemia without hypertriglyceridemia 01/29/2009  . Compulsive tobacco user syndrome 09/21/2008  . Affective bipolar disorder ([place]) 08/24/2008  . Clinical depression 08/24/2008  . Endometriosis 08/24/2008  . Blood in the urine 08/24/2008  . Benign essential HTN 08/24/2008  . Syncope and collapse 08/24/2008   Past Medical History:  Diagnosis Date  . Bipolar disorder ([place]) 08/24/2008  . Chronic kidney disease     . Depressive disorder 08/24/2008  . Disorder of kidney and ureter 07/19/2009  . Fibrocystic breast disease   . Hematuria 08/24/2008  . Hydronephrosis of left kidney   . Hypercalcemia 07/19/2009  . Hyperglycemia   . Hyperlipidemia 08/24/2008  . Hypertension 08/24/2008   essential, benign  . Hypokalemia   . Panic attack   . Pure hypercholesterolemia 09/28//2010       Medications: Outpatient Medications Prior to Visit  Medication Sig  . aspirin EC 81 MG tablet Take 81 mg by mouth daily.  . busPIRone (BUSPAR) 15 MG tablet TAKE 1 TABLET (15 MG TOTAL) BY MOUTH 2 (TWO) TIMES DAILY.  . clotrimazole-betamethasone (LOTRISONE) cream APPLY TOPICALLY 2 TIMES DAILY  . colchicine 0.6 MG tablet Take 2 tabs PO at onset of symptoms, and one tab PO daily until symptoms resolve. Repeat as needed  . hydrochlorothiazide (HYDRODIURIL) 12.5 MG tablet TAKE 1 TABLET BY MOUTH EVERY DAY  . lisinopril (ZESTRIL) 40 MG tablet TAKE 1 TABLET BY MOUTH EVERY DAY  . lovastatin (MEVACOR) 40 MG tablet TAKE 1 TABLET BY MOUTH EVERYDAY AT BEDTIME  . montelukast (SINGULAIR) 10 MG tablet TAKE 1 TABLET BY MOUTH EVERY DAY  . OLANZapine (ZYPREXA) 15 MG tablet TAKE 1 TABLET BY MOUTH EVERY DAY  . ondansetron (ZOFRAN ODT) 4 MG disintegrating tablet Take 1 tablet (4 mg total) by mouth every 8 (eight) hours as needed.  . traZODone (DESYREL) 100 MG tablet TAKE 1 TABLET (100 MG TOTAL) BY MOUTH AT BEDTIME AS NEEDED FOR SLEEP.  Marland Kitchen venlafaxine (EFFEXOR) 75 MG tablet Take 1 tablet (75 mg total) by mouth  3 (three) times daily with meals.  . Vitamin D, Ergocalciferol, (DRISDOL) 1.25 MG (50000 UNIT) CAPS capsule TAKE 1 CAPSULE (50,000 UNITS TOTAL) BY MOUTH EVERY 7 (SEVEN) DAYS.  Marland Kitchen zolpidem (AMBIEN) 10 MG tablet Take 1 tablet (10 mg total) by mouth at bedtime as needed for sleep.   No facility-administered medications prior to visit.    Review of Systems  Constitutional: Negative.   Respiratory: Negative.   Cardiovascular: Negative.    Gastrointestinal: Positive for abdominal distention and constipation. Negative for anal bleeding, blood in stool, diarrhea and nausea.  Musculoskeletal: Negative for back pain.  Neurological: Negative.     Last CBC Lab Results  Component Value Date   WBC 13.3 (H) 02/16/2019   HGB 14.5 02/16/2019   HCT 40.7 02/16/2019   MCV 84.8 02/16/2019   MCH 30.2 02/16/2019   RDW 13.4 02/16/2019   PLT 305 [id number]   Last metabolic panel Lab Results  Component Value Date   GLUCOSE 135 (H) 02/16/2019   NA 137 02/16/2019   K 3.3 (L) 02/16/2019   CL 105 02/16/2019   CO2 23 02/16/2019   BUN 15 02/16/2019   CREATININE 1.63 (H) 02/16/2019   GFRNONAA 34 (L) 02/16/2019   GFRAA 39 (L) 02/16/2019   CALCIUM 10.2 02/16/2019   PROT 6.4 11/09/2018   ALBUMIN 4.1 11/09/2018   LABGLOB 2.3 11/09/2018   AGRATIO 1.8 11/09/2018   BILITOT 0.4 11/09/2018   ALKPHOS 77 11/09/2018   AST 13 11/09/2018   ALT 11 11/09/2018   ANIONGAP 9 02/16/2019   Last hemoglobin A1c Lab Results  Component Value Date   HGBA1C 5.5 11/09/2018        Objective:    BP 121/83 (BP Location: Right Arm, Patient Position: Sitting, Cuff Size: Large)   Pulse (!) 11   Temp (!) 97.3 F (36.3 C) (Temporal)   Resp 16   Wt 172 lb (78 kg)   BMI 30.47 kg/m  BP Readings from Last 3 Encounters:  08/18/19 121/83  07/12/19 118/74  02/16/19 (!) 168/103   Wt Readings from Last 3 Encounters:  08/18/19 172 lb (78 kg)  07/12/19 170 lb 9.6 oz (77.4 kg)  11/09/18 173 lb (78.5 kg)      Physical Exam Vitals reviewed.  Constitutional:      General: She is not in acute distress.    Appearance: Normal appearance. She is well-developed. She is not ill-appearing.  HENT:     Head: Normocephalic and atraumatic.  Pulmonary:     Effort: Pulmonary effort is normal. No respiratory distress.  Musculoskeletal:     Cervical back: Normal range of motion and neck supple.  Neurological:     Mental Status: She is alert.  Psychiatric:         Mood and Affect: Mood normal.        Behavior: Behavior normal.        Thought Content: Thought content normal.        Judgment: Judgment normal.      No results found for any visits on 08/18/19.    Assessment & Plan:    1. Chronic idiopathic constipation Tried samples on Linzess 55mcg and reports some improvement but may need to increase. Samples of 134mcg and 25mcg were both given to patient. She will send a mychart message for which dose works best for her. Will send in Rx once she lets [place] know.    No follow-ups on file.     IMar Daring, PA-C, have  reviewed all documentation for this visit. The documentation on 08/18/19 for the exam, diagnosis, procedures, and orders are all accurate and complete.   Rubye Beach  [facility] [phone] (phone) [phone] (fax)  [place]

## 2019-08-18 ENCOUNTER — Ambulatory Visit: Payer: 59 | Admitting: Physician Assistant

## 2019-08-18 ENCOUNTER — Other Ambulatory Visit: Payer: Self-pay

## 2019-08-18 ENCOUNTER — Encounter: Payer: Self-pay | Admitting: Physician Assistant

## 2019-08-18 DIAGNOSIS — K5904 Chronic idiopathic constipation: Secondary | ICD-10-CM

## 2019-08-18 NOTE — Patient Instructions (Signed)

## 2019-08-22 ENCOUNTER — Encounter: Payer: Self-pay | Admitting: Physician Assistant

## 2019-08-22 DIAGNOSIS — K5904 Chronic idiopathic constipation: Secondary | ICD-10-CM

## 2019-08-23 ENCOUNTER — Other Ambulatory Visit: Payer: Self-pay

## 2019-08-23 ENCOUNTER — Ambulatory Visit: Payer: 59 | Attending: Internal Medicine

## 2019-08-23 DIAGNOSIS — Z23 Encounter for immunization: Secondary | ICD-10-CM

## 2019-08-23 MED ORDER — LINACLOTIDE 290 MCG PO CAPS: 290.0000 ug | ORAL_CAPSULE | Freq: Every day | ORAL | 3 refills | Status: DC

## 2019-08-23 NOTE — Progress Notes (Signed)
   Covid-19 Vaccination Clinic  Name:  Stephanie Ball    MRN: [id number] DOB: [birth year]  08/23/2019  [name] was observed post Covid-19 immunization for 15 minutes without incident. She was provided with Vaccine Information Sheet and instruction to access the V-Safe system.   [name] was instructed to call 911 with any severe reactions post vaccine: Marland Kitchen Difficulty breathing  . Swelling of face and throat  . A fast heartbeat  . A bad rash all over body  . Dizziness and weakness   Immunizations Administered    Name Date Dose VIS Date Route   Pfizer COVID-19 Vaccine 08/23/2019  8:16 AM 0.3 mL 06/28/2018 Intramuscular   Manufacturer: [place]   Lot: [id number]   [place]: [id number]

## 2019-08-23 NOTE — Telephone Encounter (Signed)
Thank you [place].  JB

## 2019-10-12 NOTE — Progress Notes (Signed)
Established patient visit   Patient: Stephanie Ball   DOB: [birth year]   64 y.o. Female  MRN: [id number] Visit Date: 10/13/2019  Today's healthcare provider: Mar Daring, PA-C   Chief Complaint  Patient presents with  . Follow-up    Primary Insomnia   Subjective    HPI Bipolar Disorder: Patient stable on Buspirone 15mg  BID,Zyprexa15mg , and Trazadone 100mg  prn and venlafaxine 75mg  TID.  Primary Insomnia: Stable. She is currently on Zolpidem 10mg . She gets 8-9 hours of sleep.  Patient also c/o of panic attack. She reports that she had one last week. Associated symptoms: SOB,racing heart rate, dizzy. Reports that it last for a few minutes. 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   GAD 7 : Generalized Anxiety Score 10/13/2019  Nervous, Anxious, on Edge 0  Control/stop worrying 0  Worry too much - different things 2  Trouble relaxing 3  Restless 0  Easily annoyed or irritable 1  Afraid - awful might happen 2  Total GAD 7 Score 8  Anxiety Difficulty Not difficult at all     Patient Active Problem List   Diagnosis Date Noted  . Constipation 08/17/2019  . Acute gouty arthritis 02/10/2016  . Insomnia 12/07/2014  . Anxiety 12/06/2014  . Allergic rhinitis 12/06/2014  . 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 Abnormal LFTs 12/06/2014  . Bloodgood disease 12/06/2014  . History of hydronephrosis 12/06/2014  . Blood glucose elevated 12/06/2014  . Decreased potassium in the blood 12/06/2014  . Arterial blood pressure decreased 12/06/2014  . History of panic attacks 12/06/2014  . Breath shortness 12/06/2014  . Abnormal mammogram 12/06/2014  . Chronic kidney disease (CKD), stage II (mild) 08/29/2012  . History of metabolic disorder 85/06/7739  . Kidney cysts 08/29/2012  . Calcium blood increased 07/19/2009  . Avitaminosis D 02/07/2009  .  Hypercholesterolemia without hypertriglyceridemia 01/29/2009  . Compulsive tobacco user syndrome 09/21/2008  . Affective bipolar disorder ([place]) 08/24/2008  . Clinical depression 08/24/2008  . Endometriosis 08/24/2008  . Blood in the urine 08/24/2008  . Benign essential HTN 08/24/2008  . Syncope and collapse 08/24/2008   Past Medical History:  Diagnosis Date  . Bipolar disorder ([place]) 08/24/2008  . Chronic kidney disease   . Depressive disorder 08/24/2008  . Disorder of kidney and ureter 07/19/2009  . Fibrocystic breast disease   . Hematuria 08/24/2008  . Hydronephrosis of left kidney   . Hypercalcemia 07/19/2009  . Hyperglycemia   . Hyperlipidemia 08/24/2008  . Hypertension 08/24/2008   essential, benign  . Hypokalemia   . Panic attack   . Pure hypercholesterolemia 09/28//2010       Medications: Outpatient Medications Prior to Visit  Medication Sig  . aspirin EC 81 MG tablet Take 81 mg by mouth daily.  . busPIRone (BUSPAR) 15 MG tablet TAKE 1 TABLET (15 MG TOTAL) BY MOUTH 2 (TWO) TIMES DAILY.  . clotrimazole-betamethasone (LOTRISONE) cream APPLY TOPICALLY 2 TIMES DAILY  . colchicine 0.6 MG tablet Take 2 tabs PO at onset of symptoms, and one tab PO daily until symptoms resolve. Repeat as needed  . hydrochlorothiazide (HYDRODIURIL) 12.5 MG tablet TAKE 1 TABLET BY MOUTH EVERY DAY  . linaclotide (LINZESS) 290 MCG CAPS capsule Take 1 capsule (290 mcg total) by mouth daily before breakfast.  . lisinopril (ZESTRIL) 40 MG tablet TAKE 1 TABLET BY MOUTH EVERY DAY  . lovastatin (MEVACOR) 40 MG tablet  TAKE 1 TABLET BY MOUTH EVERYDAY AT BEDTIME  . montelukast (SINGULAIR) 10 MG tablet TAKE 1 TABLET BY MOUTH EVERY DAY  . OLANZapine (ZYPREXA) 15 MG tablet TAKE 1 TABLET BY MOUTH EVERY DAY  . traZODone (DESYREL) 100 MG tablet TAKE 1 TABLET (100 MG TOTAL) BY MOUTH AT BEDTIME AS NEEDED FOR SLEEP.  Marland Kitchen venlafaxine (EFFEXOR) 75 MG tablet Take 1 tablet (75 mg total) by mouth 3 (three) times daily  with meals.  . Vitamin D, Ergocalciferol, (DRISDOL) 1.25 MG (50000 UNIT) CAPS capsule TAKE 1 CAPSULE (50,000 UNITS TOTAL) BY MOUTH EVERY 7 (SEVEN) DAYS.  Marland Kitchen zolpidem (AMBIEN) 10 MG tablet Take 1 tablet (10 mg total) by mouth at bedtime as needed for sleep.  Marland Kitchen ondansetron (ZOFRAN ODT) 4 MG disintegrating tablet Take 1 tablet (4 mg total) by mouth every 8 (eight) hours as needed.   No facility-administered medications prior to visit.    Review of Systems  Constitutional: Negative for appetite change, chills, fatigue and fever.  Respiratory: Negative for chest tightness and shortness of breath.   Cardiovascular: Negative for chest pain and palpitations.  Gastrointestinal: Negative for abdominal pain, nausea and vomiting.  Neurological: Negative for dizziness and weakness.  Psychiatric/Behavioral: Positive for sleep disturbance. The patient is nervous/anxious.     Last CBC Lab Results  Component Value Date   WBC 13.3 (H) 02/16/2019   HGB 14.5 02/16/2019   HCT 40.7 02/16/2019   MCV 84.8 02/16/2019   MCH 30.2 02/16/2019   RDW 13.4 02/16/2019   PLT 305 10/62/6948   Last metabolic panel Lab Results  Component Value Date   GLUCOSE 135 (H) 02/16/2019   NA 137 02/16/2019   K 3.3 (L) 02/16/2019   CL 105 02/16/2019   CO2 23 02/16/2019   BUN 15 02/16/2019   CREATININE 1.63 (H) 02/16/2019   GFRNONAA 34 (L) 02/16/2019   GFRAA 39 (L) 02/16/2019   CALCIUM 10.2 02/16/2019   PROT 6.4 11/09/2018   ALBUMIN 4.1 11/09/2018   LABGLOB 2.3 11/09/2018   AGRATIO 1.8 11/09/2018   BILITOT 0.4 11/09/2018   ALKPHOS 77 11/09/2018   AST 13 11/09/2018   ALT 11 11/09/2018   ANIONGAP 9 02/16/2019   Last lipids Lab Results  Component Value Date   CHOL 194 11/09/2018   HDL 42 11/09/2018   LDLCALC 79 11/09/2018   TRIG 367 (H) 11/09/2018   CHOLHDL 4.6 (H) 11/09/2018      Objective    BP (!) 147/93 (BP Location: Left Arm, Patient Position: Sitting, Cuff Size: Large)   Pulse 73   Temp (!) 97 F  (36.1 C) (Temporal)   Resp 16   Wt 174 lb (78.9 kg)   BMI 30.82 kg/m  BP Readings from Last 3 Encounters:  10/13/19 (!) 147/93  08/18/19 121/83  07/12/19 118/74   Wt Readings from Last 3 Encounters:  10/13/19 174 lb (78.9 kg)  08/18/19 172 lb (78 kg)  07/12/19 170 lb 9.6 oz (77.4 kg)      Physical Exam Vitals reviewed.  Constitutional:      General: She is not in acute distress.    Appearance: Normal appearance. She is well-developed. She is obese. She is not ill-appearing or diaphoretic.  Cardiovascular:     Rate and Rhythm: Normal rate and regular rhythm.     Heart sounds: Normal heart sounds. No murmur heard.  No friction rub. No gallop.   Pulmonary:     Effort: Pulmonary effort is normal. No respiratory distress.  Breath sounds: Normal breath sounds. No wheezing or rales.  Musculoskeletal:     Cervical back: Normal range of motion and neck supple.  Neurological:     Mental Status: She is alert.  Psychiatric:        Mood and Affect: Mood normal.        Behavior: Behavior normal.        Thought Content: Thought content normal.        Judgment: Judgment normal.       No results found for any visits on 10/13/19.  Assessment & Plan     1. Bipolar disorder in full remission, most recent episode unspecified type ([place]) Stable. Diagnosis pulled for medication refill. Continue current medical treatment plan. - venlafaxine (EFFEXOR) 75 MG tablet; Take 1 tablet (75 mg total) by mouth 3 (three) times daily with meals.  Dispense: 270 tablet; Refill: 1  2. Anxiety Having more symptoms due to having to watch her grandchildren and watch her daughter's dog as well. She has been having increased anxiety and panic symptoms, specifically with driving with the grandkids. Increase Buspar from 15mg  to 30mg  as noted below. Call if not as effective.  - busPIRone (BUSPAR) 30 MG tablet; Take 1 tablet (30 mg total) by mouth 2 (two) times daily.  Dispense: 180 tablet; Refill: 0  3.  Primary insomnia Stable. Diagnosis pulled for medication refill. Continue current medical treatment plan. - zolpidem (AMBIEN) 10 MG tablet; Take 1 tablet (10 mg total) by mouth at bedtime as needed for sleep.  Dispense: 90 tablet; Refill: 1   No follow-ups on file.      Reynolds Bowl, PA-C, have reviewed all documentation for this visit. The documentation on 10/17/19 for the exam, diagnosis, procedures, and orders are all accurate and complete.   Rubye Beach  [facility] [phone] (phone) [phone] (fax)  [place]

## 2019-10-13 ENCOUNTER — Other Ambulatory Visit: Payer: Self-pay

## 2019-10-13 ENCOUNTER — Ambulatory Visit: Payer: No Typology Code available for payment source | Admitting: Physician Assistant

## 2019-10-13 ENCOUNTER — Encounter: Payer: Self-pay | Admitting: Physician Assistant

## 2019-10-13 VITALS — BP 147/93 | HR 73 | Temp 97.0°F | Resp 16 | Wt 174.0 lb

## 2019-10-13 DIAGNOSIS — F419 Anxiety disorder, unspecified: Secondary | ICD-10-CM | POA: Diagnosis not present

## 2019-10-13 DIAGNOSIS — F3170 Bipolar disorder, currently in remission, most recent episode unspecified: Secondary | ICD-10-CM

## 2019-10-13 DIAGNOSIS — F5101 Primary insomnia: Secondary | ICD-10-CM

## 2019-10-13 MED ORDER — VENLAFAXINE HCL 75 MG PO TABS: 75.0000 mg | ORAL_TABLET | Freq: Three times a day (TID) | ORAL | 1 refills | Status: DC

## 2019-10-13 MED ORDER — ZOLPIDEM TARTRATE 10 MG PO TABS: 10.0000 mg | ORAL_TABLET | Freq: Every evening | ORAL | 1 refills | Status: DC | PRN

## 2019-10-13 MED ORDER — BUSPIRONE HCL 30 MG PO TABS: 30.0000 mg | ORAL_TABLET | Freq: Two times a day (BID) | ORAL | 0 refills | Status: DC

## 2019-10-17 ENCOUNTER — Encounter: Payer: Self-pay | Admitting: Physician Assistant

## 2019-10-20 ENCOUNTER — Telehealth: Payer: Self-pay | Admitting: *Deleted

## 2019-10-20 NOTE — Telephone Encounter (Signed)
Form was received to start PA. Will call once completed.

## 2019-10-20 NOTE — Telephone Encounter (Signed)
Copied from [place] [id number]. Topic: General - Other >> Oct 20, 2019 11:38 AM Oneta Rack wrote: Reason for CRM: patient wanted to inform PCP Holland Falling will be faxing or mailing a form regarding patients zolpidem (AMBIEN) 10 MG tablet . Patient states insurance will only allow 15 day supply for 25 days but if PCP fills form out patient will be able to receive a 90 day supply, please note when form is received

## 2019-11-13 ENCOUNTER — Telehealth: Payer: Self-pay

## 2019-11-13 DIAGNOSIS — N182 Chronic kidney disease, stage 2 (mild): Secondary | ICD-10-CM

## 2019-11-13 DIAGNOSIS — R739 Hyperglycemia, unspecified: Secondary | ICD-10-CM

## 2019-11-13 DIAGNOSIS — I10 Essential (primary) hypertension: Secondary | ICD-10-CM

## 2019-11-13 DIAGNOSIS — E7800 Pure hypercholesterolemia, unspecified: Secondary | ICD-10-CM

## 2019-11-13 DIAGNOSIS — E559 Vitamin D deficiency, unspecified: Secondary | ICD-10-CM

## 2019-11-13 DIAGNOSIS — F3170 Bipolar disorder, currently in remission, most recent episode unspecified: Secondary | ICD-10-CM

## 2019-11-13 DIAGNOSIS — E876 Hypokalemia: Secondary | ICD-10-CM

## 2019-11-13 NOTE — Telephone Encounter (Signed)
Labs ordered. She can come pick up

## 2019-11-13 NOTE — Telephone Encounter (Signed)
I do not see any labs ordered   Copied from [place] [phone]. Topic: General - Other >> Nov 13, 2019  8:55 AM Sheran Luz wrote: Patient inquired if she can stop by to pick up blood work orders for lab today (already in system)

## 2019-11-13 NOTE — Addendum Note (Signed)
Addended by: Mar Daring on: 11/13/2019 12:07 PM   Modules accepted: Orders

## 2019-11-17 ENCOUNTER — Ambulatory Visit (INDEPENDENT_AMBULATORY_CARE_PROVIDER_SITE_OTHER): Payer: No Typology Code available for payment source | Admitting: Physician Assistant

## 2019-11-17 ENCOUNTER — Encounter: Payer: Self-pay | Admitting: Physician Assistant

## 2019-11-17 ENCOUNTER — Encounter: Payer: No Typology Code available for payment source | Admitting: Physician Assistant

## 2019-11-17 ENCOUNTER — Other Ambulatory Visit: Payer: Self-pay

## 2019-11-17 VITALS — BP 117/82 | HR 103 | Temp 97.1°F | Resp 16 | Ht 62.0 in | Wt 172.6 lb

## 2019-11-17 DIAGNOSIS — Z0000 Encounter for general adult medical examination without abnormal findings: Secondary | ICD-10-CM

## 2019-11-17 DIAGNOSIS — F419 Anxiety disorder, unspecified: Secondary | ICD-10-CM | POA: Diagnosis not present

## 2019-11-17 DIAGNOSIS — E6609 Other obesity due to excess calories: Secondary | ICD-10-CM | POA: Diagnosis not present

## 2019-11-17 DIAGNOSIS — Z1231 Encounter for screening mammogram for malignant neoplasm of breast: Secondary | ICD-10-CM

## 2019-11-17 DIAGNOSIS — Z1211 Encounter for screening for malignant neoplasm of colon: Secondary | ICD-10-CM

## 2019-11-17 DIAGNOSIS — F17200 Nicotine dependence, unspecified, uncomplicated: Secondary | ICD-10-CM

## 2019-11-17 DIAGNOSIS — Z6830 Body mass index (BMI) 30.0-30.9, adult: Secondary | ICD-10-CM

## 2019-11-17 MED ORDER — BUSPIRONE HCL 30 MG PO TABS: 30.0000 mg | ORAL_TABLET | Freq: Two times a day (BID) | ORAL | 1 refills | Status: DC

## 2019-11-17 NOTE — Progress Notes (Signed)
Complete physical exam   Patient: Stephanie Ball   DOB: [birth year]   63 y.o. Female  MRN: [id number] Visit Date: 11/17/2019  I,Stephanie Ball,acting as a scribe for [employer], Ball.,have documented all relevant documentation on the behalf of Stephanie Ball,as directed by  Stephanie Ball while in the presence of Stephanie Ball.  Today's healthcare provider: Mar Daring, Ball   Chief Complaint  Patient presents with  . Annual Exam   Subjective    [name] is a 64 y.o. female who presents today for a complete physical exam.  She reports consuming a general diet. The patient does not participate in regular exercise at present. She generally feels well. She reports sleeping well. She does not have additional problems to discuss today.  HPI  01/05/2017 Pap/HPV-negative 02/14/2019 Mammogram-BI-RADS 1   Past Medical History:  Diagnosis Date  . Bipolar disorder ([place]) 08/24/2008  . Chronic kidney disease   . Depressive disorder 08/24/2008  . Disorder of kidney and ureter 07/19/2009  . Fibrocystic breast disease   . Hematuria 08/24/2008  . Hydronephrosis of left kidney   . Hypercalcemia 07/19/2009  . Hyperglycemia   . Hyperlipidemia 08/24/2008  . Hypertension 08/24/2008   essential, benign  . Hypokalemia   . Panic attack   . Pure hypercholesterolemia 09/28//2010   Past Surgical History:  Procedure Laterality Date  . ABDOMINAL HYSTERECTOMY     abdominal:due to fibroid and endometriosis. No history of abnormal paps. Ovaries removed also.  Marland Kitchen BREAST BIOPSY Right 2012   FIBROEPITHEIAL LESION WITH FLORID DUCTAL  . CHOLECYSTECTOMY     no further information given  . KIDNEY SURGERY     as stated pt had kidney surgery with no further given  . OOPHORECTOMY Bilateral 1996   Dr, DeFrancisco  . TUBAL LIGATION     Social History   Socioeconomic History  . Marital status: Married    Spouse name: Not on file  .  Number of children: Not on file  . Years of education: Not on file  . Highest education level: Not on file  Occupational History  . Not on file  Tobacco Use  . Smoking status: Current Every Day Smoker    Packs/day: 0.50  . Smokeless tobacco: Never Used  . Tobacco comment: would like to quit; as stated pt smoked 1/2 PPd for 25 years and now is smoking 5-6 cigarettes per day.  Vaping Use  . Vaping Use: Never used  Substance and Sexual Activity  . Alcohol use: No  . Drug use: No  . Sexual activity: Not on file  Other Topics Concern  . Not on file  Social History Narrative  . Not on file   Social Determinants of Health   Financial Resource Strain:   . Difficulty of Paying Living Expenses:   Food Insecurity:   . Worried About Charity fundraiser in the Last Year:   . Arboriculturist in the Last Year:   Transportation Needs:   . Film/video editor (Medical):   Marland Kitchen Lack of Transportation (Non-Medical):   Physical Activity:   . Days of Exercise per Week:   . Minutes of Exercise per Session:   Stress:   . Feeling of Stress :   Social Connections:   . Frequency of Communication with Friends and Family:   . Frequency of Social Gatherings with Friends and Family:   . Attends Religious Services:   . Active  Member of Clubs or Organizations:   . Attends Archivist Meetings:   Marland Kitchen Marital Status:   Intimate Partner Violence:   . Fear of Current or Ex-Partner:   . Emotionally Abused:   Marland Kitchen Physically Abused:   . Sexually Abused:    Family Status  Relation Name Status  . Mother  Deceased       Cerebral aneurysm  . Father  Deceased  . MGM  Deceased       brain Aneurysm  . PGM  Deceased  . Sister  Alive  . Daughter  Alive  . Sister  Alive  . Neg Hx  (Not Specified)   Family History  Problem Relation Age of Onset  . Anuerysm Mother   . Diabetes Father   . Cancer Paternal Grandmother        skin  . Healthy Sister   . Healthy Daughter   . Healthy Sister   .  Breast cancer Neg Hx    Allergies  Allergen Reactions  . Aciphex  [Rabeprazole Sodium]   . Amoxicillin-Pot Clavulanate   . Iron   . Macrobid  [employer] Macro]   . Metoprolol Other (See Comments)    After taking medication for about 72 hours she noted elevated systolic BP and swelling of hands and feet and discontinued.    Patient Care Team: Stephanie Ball as PCP - General (Family Medicine)   Medications: Outpatient Medications Prior to Visit  Medication Sig  . aspirin EC 81 MG tablet Take 81 mg by mouth daily.  . clotrimazole-betamethasone (LOTRISONE) cream APPLY TOPICALLY 2 TIMES DAILY  . colchicine 0.6 MG tablet Take 2 tabs PO at onset of symptoms, and one tab PO daily until symptoms resolve. Repeat as needed  . famotidine (PEPCID) 20 MG tablet Take 20 mg by mouth 2 (two) times daily.  . hydrochlorothiazide (HYDRODIURIL) 12.5 MG tablet TAKE 1 TABLET BY MOUTH EVERY DAY  . linaclotide (LINZESS) 290 MCG CAPS capsule Take 1 capsule (290 mcg total) by mouth daily before breakfast.  . lisinopril (ZESTRIL) 40 MG tablet TAKE 1 TABLET BY MOUTH EVERY DAY  . loratadine (CLARITIN) 10 MG tablet Take 10 mg by mouth daily.  Marland Kitchen lovastatin (MEVACOR) 40 MG tablet TAKE 1 TABLET BY MOUTH EVERYDAY AT BEDTIME  . montelukast (SINGULAIR) 10 MG tablet TAKE 1 TABLET BY MOUTH EVERY DAY  . OLANZapine (ZYPREXA) 15 MG tablet TAKE 1 TABLET BY MOUTH EVERY DAY  . traZODone (DESYREL) 100 MG tablet TAKE 1 TABLET (100 MG TOTAL) BY MOUTH AT BEDTIME AS NEEDED FOR SLEEP.  Marland Kitchen venlafaxine (EFFEXOR) 75 MG tablet Take 1 tablet (75 mg total) by mouth 3 (three) times daily with meals.  . Vitamin D, Ergocalciferol, (DRISDOL) 1.25 MG (50000 UNIT) CAPS capsule TAKE 1 CAPSULE (50,000 UNITS TOTAL) BY MOUTH EVERY 7 (SEVEN) DAYS.  Marland Kitchen zolpidem (AMBIEN) 10 MG tablet Take 1 tablet (10 mg total) by mouth at bedtime as needed for sleep.  . [DISCONTINUED] busPIRone (BUSPAR) 30 MG tablet Take 1 tablet (30 mg total) by  mouth 2 (two) times daily.   No facility-administered medications prior to visit.    Review of Systems  Constitutional: Negative.   HENT: Negative.   Eyes: Negative.   Respiratory: Negative.   Cardiovascular: Negative.   Gastrointestinal: Negative.   Endocrine: Negative.   Genitourinary: Negative.   Musculoskeletal: Negative.   Skin: Negative.   Allergic/Immunologic: Negative.   Neurological: Negative.   Hematological: Negative.   Psychiatric/Behavioral: Negative.  Last CBC Lab Results  Component Value Date   WBC 8.3 11/17/2019   HGB 14.3 11/17/2019   HCT 42.0 11/17/2019   MCV 88.2 11/17/2019   MCH 30.0 11/17/2019   RDW 14.6 11/17/2019   PLT 295 78/93/8101   Last metabolic panel Lab Results  Component Value Date   GLUCOSE 105 (H) 11/17/2019   NA 141 11/17/2019   K 3.7 11/17/2019   CL 103 11/17/2019   CO2 27 11/17/2019   BUN 12 11/17/2019   CREATININE 1.26 (H) 11/17/2019   GFRNONAA 34 (L) 02/16/2019   GFRAA 39 (L) 02/16/2019   CALCIUM 11.7 (H) 11/17/2019   PROT 7.1 11/17/2019   ALBUMIN 4.1 11/09/2018   LABGLOB 2.3 11/09/2018   AGRATIO 1.8 11/09/2018   BILITOT 0.8 11/17/2019   ALKPHOS 77 11/09/2018   AST 16 11/17/2019   ALT 9 11/17/2019   ANIONGAP 9 02/16/2019   Last lipids Lab Results  Component Value Date   CHOL 189 11/17/2019   HDL 47 (L) 11/17/2019   LDLCALC 106 (H) 11/17/2019   TRIG 255 (H) 11/17/2019   CHOLHDL 4.0 11/17/2019   Last hemoglobin A1c Lab Results  Component Value Date   HGBA1C 5.1 11/17/2019   Last thyroid functions Lab Results  Component Value Date   TSH 1.77 11/17/2019      Objective    BP 117/82 (BP Location: Left Arm, Patient Position: Sitting, Cuff Size: Normal)   Pulse (!) 103   Temp (!) 97.1 F (36.2 C) (Temporal)   Resp 16   Ht 5\' 2"  (1.575 m)   Wt 172 lb 9.6 oz (78.3 kg)   BMI 31.57 kg/m  BP Readings from Last 3 Encounters:  11/17/19 117/82  10/13/19 (!) 147/93  08/18/19 121/83   Wt Readings from  Last 3 Encounters:  11/17/19 172 lb 9.6 oz (78.3 kg)  10/13/19 174 lb (78.9 kg)  08/18/19 172 lb (78 kg)      Physical Exam Vitals reviewed.  Constitutional:      General: She is not in acute distress.    Appearance: Normal appearance. She is well-developed. She is obese. She is not ill-appearing or diaphoretic.  HENT:     Head: Normocephalic and atraumatic.     Right Ear: Tympanic membrane, ear canal and external ear normal.     Left Ear: Tympanic membrane, ear canal and external ear normal.     Nose: Nose normal.     Mouth/Throat:     Mouth: Mucous membranes are moist.     Pharynx: Oropharynx is clear. No oropharyngeal exudate.  Eyes:     General: No scleral icterus.       Right eye: No discharge.        Left eye: No discharge.     Extraocular Movements: Extraocular movements intact.     Conjunctiva/sclera: Conjunctivae normal.     Pupils: Pupils are equal, round, and reactive to light.  Neck:     Thyroid: No thyromegaly.     Vascular: No JVD.     Trachea: No tracheal deviation.  Cardiovascular:     Rate and Rhythm: Normal rate and regular rhythm.     Pulses: Normal pulses.     Heart sounds: Normal heart sounds. No murmur heard.  No friction rub. No gallop.   Pulmonary:     Effort: Pulmonary effort is normal. No respiratory distress.     Breath sounds: Normal breath sounds. No wheezing or rales.  Chest:     Chest wall:  No tenderness.  Abdominal:     General: Abdomen is flat. Bowel sounds are normal. There is no distension.     Palpations: Abdomen is soft. There is no mass.     Tenderness: There is no abdominal tenderness. There is no guarding or rebound.  Musculoskeletal:        General: No tenderness or deformity. Normal range of motion.     Cervical back: Normal range of motion and neck supple.     Right lower leg: No edema.     Left lower leg: No edema.  Lymphadenopathy:     Cervical: No cervical adenopathy.  Skin:    General: Skin is warm and dry.      Capillary Refill: Capillary refill takes less than 2 seconds.     Findings: No rash.  Neurological:     General: No focal deficit present.     Mental Status: She is alert and oriented to person, place, and time. Mental status is at baseline.     Sensory: No sensory deficit.     Motor: No weakness.     Gait: Gait normal.  Psychiatric:        Mood and Affect: Mood normal.        Behavior: Behavior normal.        Thought Content: Thought content normal.        Judgment: Judgment normal.      Last depression screening scores PHQ 2/9 Scores 11/17/2019 10/06/2017 01/05/2017  PHQ - 2 Score 1 2 0  PHQ- 9 Score 3 4 1   Exception Documentation - - -   Last fall risk screening Fall Risk  11/17/2019  Falls in the past year? 0  Number falls in past yr: 0  Injury with Fall? 0  Risk for fall due to : No Fall Risks  Follow up Falls evaluation completed   Last Audit-C alcohol use screening Alcohol Use Disorder Test (AUDIT) 11/17/2019  1. How often do you have a drink containing alcohol? 0  2. How many drinks containing alcohol do you have on a typical day when you are drinking? 0  3. How often do you have six or more drinks on one occasion? 0  AUDIT-C Score 0  Alcohol Brief Interventions/Follow-up AUDIT Score <7 follow-up not indicated   A score of 3 or more in women, and 4 or more in men indicates increased risk for alcohol abuse, EXCEPT if all of the points are from question 1   No results found for any visits on 11/17/19.  Assessment & Plan    Routine Health Maintenance and Physical Exam  Exercise Activities and Dietary recommendations Goals   None     Immunization History  Administered Date(s) Administered  . Influenza Split 02/13/2009, 01/22/2010, 01/14/2011  . Influenza,inj,Quad PF,6+ Mos 01/20/2013, 03/09/2014, 01/05/2017, 01/27/2019  . Influenza-Unspecified 03/01/2015, 02/15/2018  . PFIZER SARS-COV-2 Vaccination 07/28/2019, 08/23/2019  . Pneumococcal Conjugate-13 01/27/2019    . Td 12/02/2016  . Tdap 09/03/2005    Health Maintenance  Topic Date Due  . COLONOSCOPY  Never done  . COLON CANCER SCREENING ANNUAL FOBT  01/05/2018  . PAP SMEAR-Modifier  01/06/2020  . INFLUENZA VACCINE  12/03/2019  . MAMMOGRAM  02/14/2020  . TETANUS/TDAP  12/03/2026  . COVID-19 Vaccine  Completed  . Hepatitis C Screening  Completed  . HIV Screening  Completed    Discussed health benefits of physical activity, and encouraged her to engage in regular exercise appropriate for her age and condition.  Problem List Items Addressed This Visit      Other   Anxiety    Stable. Continue Venlafaxine 75mg  TID w/ meals, trazodone 100mg  q hs for sleep. Increase Buspar to 30mg  BID. Will check labs as below and f/u pending results.      Relevant Medications   busPIRone (BUSPAR) 30 MG tablet   Compulsive tobacco user syndrome    Declines wanting to quit at this time      Class 1 obesity due to excess calories with serious comorbidity and body mass index (BMI) of 30.0 to 30.9 in adult    Counseled patient on healthy lifestyle modifications including dieting and exercise.       Encounter for screening mammogram for malignant neoplasm of breast    There is no family history of breast cancer. She does perform regular self breast exams. Mammogram was ordered as below. Information for [facility] clinic was given to patient so she may schedule her mammogram at her convenience.      Relevant Orders   MM 3D SCREEN BREAST BILATERAL   Colon cancer screening    Cologuard ordered for patient.      Relevant Orders   Cologuard   Wellness examination - Primary    Normal physical exam today. Will check labs as below and f/u pending lab results. If labs are stable and WNL she will not need to have these rechecked for one year at her next annual physical exam. She is to call the office in the meantime if she has any acute issue, questions or concerns.          Return in about 6 months  (around 05/19/2020) for chronic disease f/u.     [place], Ball, have reviewed all documentation for this visit. The documentation on 11/21/19 for the exam, diagnosis, procedures, and orders are all accurate and complete.   Rubye Beach  [facility] [phone] (phone) [phone] (fax)  [place]

## 2019-11-17 NOTE — Patient Instructions (Signed)

## 2019-11-18 LAB — CBC WITH DIFFERENTIAL/PLATELET
Absolute Monocytes: 481 cells/uL (ref 200–950)
Basophils Absolute: 8 cells/uL (ref 0–200)
Basophils Relative: 0.1 %
Eosinophils Absolute: 274 cells/uL (ref 15–500)
Eosinophils Relative: 3.3 %
HCT: 42 % (ref 35.0–45.0)
Hemoglobin: 14.3 g/dL (ref 11.7–15.5)
Lymphs Abs: 2241 cells/uL (ref 850–3900)
MCH: 30 pg (ref 27.0–33.0)
MCHC: 34 g/dL (ref 32.0–36.0)
MCV: 88.2 fL (ref 80.0–100.0)
MPV: 9.2 fL (ref 7.5–12.5)
Monocytes Relative: 5.8 %
Neutro Abs: 5295 cells/uL (ref 1500–7800)
Neutrophils Relative %: 63.8 %
Platelets: 295 10*3/uL (ref 140–400)
RBC: 4.76 10*6/uL (ref 3.80–5.10)
RDW: 14.6 % (ref 11.0–15.0)
Total Lymphocyte: 27 %
WBC: 8.3 10*3/uL (ref 3.8–10.8)

## 2019-11-18 LAB — COMPREHENSIVE METABOLIC PANEL
AG Ratio: 1.6 (calc) (ref 1.0–2.5)
ALT: 9 U/L (ref 6–29)
AST: 16 U/L (ref 10–35)
Albumin: 4.4 g/dL (ref 3.6–5.1)
Alkaline phosphatase (APISO): 89 U/L (ref 37–153)
BUN/Creatinine Ratio: 10 (calc) (ref 6–22)
BUN: 12 mg/dL (ref 7–25)
CO2: 27 mmol/L (ref 20–32)
Calcium: 11.7 mg/dL — ABNORMAL HIGH (ref 8.6–10.4)
Chloride: 103 mmol/L (ref 98–110)
Creat: 1.26 mg/dL — ABNORMAL HIGH (ref 0.50–0.99)
Globulin: 2.7 g/dL (calc) (ref 1.9–3.7)
Glucose, Bld: 105 mg/dL — ABNORMAL HIGH (ref 65–99)
Potassium: 3.7 mmol/L (ref 3.5–5.3)
Sodium: 141 mmol/L (ref 135–146)
Total Bilirubin: 0.8 mg/dL (ref 0.2–1.2)
Total Protein: 7.1 g/dL (ref 6.1–8.1)

## 2019-11-18 LAB — TSH: TSH: 1.77 mIU/L (ref 0.40–4.50)

## 2019-11-18 LAB — LIPID PANEL
Cholesterol: 189 mg/dL (ref <200)
HDL: 47 mg/dL — ABNORMAL LOW (ref >=50)
LDL Cholesterol (Calc): 106 mg/dL (calc) — ABNORMAL HIGH
Non-HDL Cholesterol (Calc): 142 mg/dL (calc) — ABNORMAL HIGH (ref <130)
Total CHOL/HDL Ratio: 4 (calc) (ref <5.0)
Triglycerides: 255 mg/dL — ABNORMAL HIGH (ref <150)

## 2019-11-18 LAB — HEMOGLOBIN A1C
Hgb A1c MFr Bld: 5.1 % of total Hgb (ref <5.7)
Mean Plasma Glucose: 100 (calc)
eAG (mmol/L): 5.5 (calc)

## 2019-11-18 LAB — VITAMIN D 25 HYDROXY (VIT D DEFICIENCY, FRACTURES): Vit D, 25-Hydroxy: 123 ng/mL — ABNORMAL HIGH (ref 30–100)

## 2019-11-20 ENCOUNTER — Telehealth: Payer: Self-pay

## 2019-11-20 NOTE — Telephone Encounter (Signed)
-----   Message from Mar Daring, [place] sent at 11/20/2019 10:41 AM EDT ----- Blood count is normal. Kidney function has improved and is lowest it has been in 2 years! Keep pushing fluids. Liver enzymes are normal. Sodium and potassium are normal. Calcium is borderline high again. Would recommend to hold any calcium supplementation if taking. A1c/sugar is normal. Cholesterol is slightly improved compared to last year, but still borderline elevated. Thyroid is normal. Vit D is normal.

## 2019-11-20 NOTE — Telephone Encounter (Signed)
Patient advised as below. She is going to try send or bring over the biometric form to be fill out

## 2019-11-21 ENCOUNTER — Encounter: Payer: Self-pay | Admitting: Physician Assistant

## 2019-11-21 DIAGNOSIS — Z1231 Encounter for screening mammogram for malignant neoplasm of breast: Secondary | ICD-10-CM | POA: Insufficient documentation

## 2019-11-21 DIAGNOSIS — E6609 Other obesity due to excess calories: Secondary | ICD-10-CM | POA: Insufficient documentation

## 2019-11-21 DIAGNOSIS — Z1211 Encounter for screening for malignant neoplasm of colon: Secondary | ICD-10-CM | POA: Insufficient documentation

## 2019-11-21 DIAGNOSIS — Z0000 Encounter for general adult medical examination without abnormal findings: Secondary | ICD-10-CM | POA: Insufficient documentation

## 2019-11-21 NOTE — Assessment & Plan Note (Signed)
Declines wanting to quit at this time

## 2019-11-21 NOTE — Assessment & Plan Note (Signed)
Normal physical exam today. Will check labs as below and f/u pending lab results. If labs are stable and WNL she will not need to have these rechecked for one year at her next annual physical exam. She is to call the office in the meantime if she has any acute issue, questions or concerns.

## 2019-11-21 NOTE — Assessment & Plan Note (Signed)
Cologuard ordered for patient.  

## 2019-11-21 NOTE — Assessment & Plan Note (Signed)
Stable. Continue Venlafaxine 75mg  TID w/ meals, trazodone 100mg  q hs for sleep. Increase Buspar to 30mg  BID. Will check labs as below and f/u pending results.

## 2019-11-21 NOTE — Assessment & Plan Note (Signed)
There is no family history of breast cancer. She does perform regular self breast exams. Mammogram was ordered as below. Information for [facility] clinic was given to patient so she may schedule her mammogram at her convenience.

## 2019-11-21 NOTE — Assessment & Plan Note (Signed)
Counseled patient on healthy lifestyle modifications including dieting and exercise.  °

## 2019-11-22 ENCOUNTER — Encounter: Payer: Self-pay | Admitting: Physician Assistant

## 2019-11-29 LAB — EXTERNAL GENERIC LAB PROCEDURE: COLOGUARD: NEGATIVE

## 2019-11-29 LAB — COLOGUARD
COLOGUARD: NEGATIVE
Cologuard: NEGATIVE

## 2019-12-24 ENCOUNTER — Other Ambulatory Visit: Payer: Self-pay | Admitting: Physician Assistant

## 2019-12-24 DIAGNOSIS — I10 Essential (primary) hypertension: Secondary | ICD-10-CM

## 2019-12-24 NOTE — Telephone Encounter (Signed)
Requested Prescriptions  Pending Prescriptions Disp Refills  . hydrochlorothiazide (HYDRODIURIL) 12.5 MG tablet [Pharmacy Med Name: HYDROCHLOROTHIAZIDE 12.5 MG TB] 90 tablet 1    Sig: TAKE 1 TABLET BY MOUTH EVERY DAY     Cardiovascular: Diuretics - Thiazide Failed - 12/24/2019  9:48 AM      Failed - Ca in normal range and within 360 days    Calcium  Date Value Ref Range Status  11/17/2019 11.7 (H) 8.6 - 10.4 mg/dL Final         Failed - Cr in normal range and within 360 days    Creat  Date Value Ref Range Status  11/17/2019 1.26 (H) 0.50 - 0.99 mg/dL Final    Comment:    For patients >7 years of age, the reference limit for Creatinine is approximately 13% higher for people identified as African-American. .          Passed - K in normal range and within 360 days    Potassium  Date Value Ref Range Status  11/17/2019 3.7 3.5 - 5.3 mmol/L Final         Passed - Na in normal range and within 360 days    Sodium  Date Value Ref Range Status  11/17/2019 141 135 - 146 mmol/L Final  11/09/2018 139 134 - 144 mmol/L Final         Passed - Last BP in normal range    BP Readings from Last 1 Encounters:  11/17/19 117/82         Passed - Valid encounter within last 6 months    Recent Outpatient Visits          1 month ago Wellness examination   [place], [place]   2 months ago Bipolar disorder in full remission, most recent episode unspecified type [facility])   [place], [place]   4 months ago Chronic idiopathic constipation   [place], [place]   5 months ago Constipation, unspecified constipation type   [place], [place], [place]   8 months ago Hematoma   [place], [place]      Future Appointments            In 5 months Burnette, Clearnce Sorrel, PA-C [employer], [place]

## 2019-12-28 ENCOUNTER — Other Ambulatory Visit: Payer: Self-pay | Admitting: Physician Assistant

## 2019-12-28 DIAGNOSIS — I10 Essential (primary) hypertension: Secondary | ICD-10-CM

## 2019-12-28 DIAGNOSIS — J302 Other seasonal allergic rhinitis: Secondary | ICD-10-CM

## 2019-12-28 NOTE — Telephone Encounter (Signed)
Requested Prescriptions  Pending Prescriptions Disp Refills  . lisinopril (ZESTRIL) 40 MG tablet [Pharmacy Med Name: LISINOPRIL 40 MG TABLET] 90 tablet 1    Sig: TAKE 1 TABLET BY MOUTH EVERY DAY     Cardiovascular:  ACE Inhibitors Failed - 12/28/2019  1:17 AM      Failed - Cr in normal range and within 180 days    Creat  Date Value Ref Range Status  11/17/2019 1.26 (H) 0.50 - 0.99 mg/dL Final    Comment:    For patients >63 years of age, the reference limit for Creatinine is approximately 13% higher for people identified as African-American. .          Passed - K in normal range and within 180 days    Potassium  Date Value Ref Range Status  11/17/2019 3.7 3.5 - 5.3 mmol/L Final         Passed - Patient is not pregnant      Passed - Last BP in normal range    BP Readings from Last 1 Encounters:  11/17/19 117/82         Passed - Valid encounter within last 6 months    Recent Outpatient Visits          1 month ago Wellness examination   [place], [place]   2 months ago Bipolar disorder in full remission, most recent episode unspecified type [facility])   [place], [place]   4 months ago Chronic idiopathic constipation   [place], [place]   5 months ago Constipation, unspecified constipation type   [place], [place], [place]   8 months ago Hematoma   [facility], Clearnce Sorrel, [place]      Future Appointments            In 4 months Burnette, Clearnce Sorrel, PA-C [employer], [facility]           . montelukast (SINGULAIR) 10 MG tablet [employer] Med Name: MONTELUKAST SOD 10 MG TABLET] 90 tablet 3    Sig: TAKE 1 TABLET BY MOUTH EVERY DAY     Pulmonology:  Leukotriene Inhibitors Passed - 12/28/2019  1:17 AM      Passed - Valid encounter within last 12 months    Recent Outpatient Visits          1 month ago Wellness  examination   [place], [place]   2 months ago Bipolar disorder in full remission, most recent episode unspecified type [facility])   [place], [place], [place]   4 months ago Chronic idiopathic constipation   [place], [place]   5 months ago Constipation, unspecified constipation type   [place], [place], [place]   8 months ago Hematoma   [facility], Clearnce Sorrel, [place]      Future Appointments            In 4 months Burnette, Clearnce Sorrel, PA-C [employer], [place]

## 2019-12-29 ENCOUNTER — Telehealth: Payer: Self-pay | Admitting: *Deleted

## 2019-12-29 ENCOUNTER — Encounter: Payer: Self-pay | Admitting: Physician Assistant

## 2019-12-29 NOTE — Telephone Encounter (Signed)
Copied from [place] [phone]. Topic: General - Other >> Dec 29, 2019 11:26 AM Hinda Lenis D wrote: PT need a pre autorotation for this medication send to her Insurance AETNA/AETNA / zolpidem (AMBIEN) 10 MG tablet [[id number]] / please advise

## 2019-12-29 NOTE — Telephone Encounter (Signed)
See mychart message about he same situation

## 2020-01-03 ENCOUNTER — Encounter: Payer: Self-pay | Admitting: Physician Assistant

## 2020-01-03 DIAGNOSIS — I10 Essential (primary) hypertension: Secondary | ICD-10-CM

## 2020-01-03 MED ORDER — HYDROCHLOROTHIAZIDE 12.5 MG PO TABS: 12.5000 mg | ORAL_TABLET | Freq: Every day | ORAL | 1 refills | Status: DC

## 2020-01-24 ENCOUNTER — Telehealth: Payer: 59 | Admitting: Nurse Practitioner

## 2020-01-24 DIAGNOSIS — J029 Acute pharyngitis, unspecified: Secondary | ICD-10-CM

## 2020-01-24 NOTE — Progress Notes (Signed)

## 2020-01-25 ENCOUNTER — Other Ambulatory Visit: Payer: Self-pay | Admitting: Physician Assistant

## 2020-01-25 DIAGNOSIS — E7800 Pure hypercholesterolemia, unspecified: Secondary | ICD-10-CM

## 2020-03-21 ENCOUNTER — Other Ambulatory Visit: Payer: Self-pay | Admitting: Physician Assistant

## 2020-03-21 DIAGNOSIS — F5101 Primary insomnia: Secondary | ICD-10-CM

## 2020-03-21 DIAGNOSIS — F3170 Bipolar disorder, currently in remission, most recent episode unspecified: Secondary | ICD-10-CM

## 2020-03-21 DIAGNOSIS — E7800 Pure hypercholesterolemia, unspecified: Secondary | ICD-10-CM

## 2020-03-21 NOTE — Telephone Encounter (Signed)
Requested medication (s) are due for refill today:no  Requested medication (s) are on the active medication list: yes  Last refill:  02/22/2020  Future visit scheduled:yes  Notes to clinic:  this refill cannot be delegated   Requested Prescriptions  Pending Prescriptions Disp Refills   zolpidem (AMBIEN) 10 MG tablet [Pharmacy Med Name: ZOLPIDEM TARTRATE 10 MG TABLET] 90 tablet 0    Sig: TAKE 1 TABLET BY MOUTH AT BEDTIME AS NEEDED FOR SLEEP.      Not Delegated - Psychiatry:  Anxiolytics/Hypnotics Failed - 03/21/2020  8:48 AM      Failed - This refill cannot be delegated      Failed - Urine Drug Screen completed in last 360 days      Passed - Valid encounter within last 6 months    Recent Outpatient Visits           4 months ago Wellness examination   [facility] Fenton Malling M, PA-C   5 months ago Bipolar disorder in full remission, most recent episode unspecified type [facility])   [place], [place], [place]   7 months ago Chronic idiopathic constipation   [place], [place], [place]   8 months ago Constipation, unspecified constipation type   [facility] Carles Collet M, [place]   11 months ago Hematoma   [facility] [place], Clearnce Sorrel, [place]       Future Appointments             In 2 months Burnette, Clearnce Sorrel, PA-C [employer], [facility]             Signed Prescriptions Disp Refills   venlafaxine (EFFEXOR) 75 MG tablet 270 tablet 0    Sig: TAKE 1 TABLET (75 MG TOTAL) BY MOUTH 3 (THREE) TIMES DAILY WITH MEALS.      Psychiatry: Antidepressants - SNRI - desvenlafaxine & venlafaxine Failed - 03/21/2020  8:48 AM      Failed - LDL in normal range and within 360 days    LDL Cholesterol (Calc)  Date Value Ref Range Status  11/17/2019 106 (H) mg/dL (calc) Final    Comment:    Reference range: <100 . Desirable range <100 mg/dL for primary prevention;    <70 mg/dL for patients with CHD or diabetic patients  with > or = 2 CHD risk factors. Marland Kitchen LDL-C is now calculated using the Martin-Hopkins  calculation, which is a validated novel method providing  better accuracy than the Friedewald equation in the  estimation of LDL-C.  Cresenciano Genre et al. Annamaria Helling. 4580;998(33): 2061-2068  (http://education.[facility].com/faq/FAQ164)           Failed - Triglycerides in normal range and within 360 days    Triglycerides  Date Value Ref Range Status  11/17/2019 255 (H) <150 mg/dL Final    Comment:    . If a non-fasting specimen was collected, consider repeat triglyceride testing on a fasting specimen if clinically indicated.  Yates Decamp et al. J. of Clin. Lipidol. 8250;5:397-673. Marland Kitchen           Passed - Total Cholesterol in normal range and within 360 days    Cholesterol, Total  Date Value Ref Range Status  11/09/2018 194 100 - 199 mg/dL Final   Cholesterol  Date Value Ref Range Status  11/17/2019 189 <200 mg/dL Final          Passed - Completed PHQ-2 or PHQ-9 in the last 360 days  Passed - Last BP in normal range    BP Readings from Last 1 Encounters:  11/17/19 117/82          Passed - Valid encounter within last 6 months    Recent Outpatient Visits           4 months ago Wellness examination   [facility] Fenton Malling M, [place]   5 months ago Bipolar disorder in full remission, most recent episode unspecified type [facility])   [place], [place], [place]   7 months ago Chronic idiopathic constipation   [place], [place], [place]   8 months ago Constipation, unspecified constipation type   [facility] Carles Collet M, [place]   11 months ago Hematoma   [place], [place]       Future Appointments             In 2 months Burnette, Clearnce Sorrel, PA-C [place], [facility]               busPIRone (BUSPAR) 15 MG tablet 180 tablet 0    Sig: TAKE 1 TABLET (15 MG TOTAL) BY MOUTH 2 (TWO) TIMES DAILY.      Psychiatry: Anxiolytics/Hypnotics - Non-controlled Passed - 03/21/2020  8:48 AM      Passed - Valid encounter within last 6 months    Recent Outpatient Visits           4 months ago Wellness examination   [place], [place]   5 months ago Bipolar disorder in full remission, most recent episode unspecified type [facility])   [place], [place], [place]   7 months ago Chronic idiopathic constipation   [place], [place], [place]   8 months ago Constipation, unspecified constipation type   [facility] Carles Collet M, [place]   11 months ago Hematoma   [facility] [place], Clearnce Sorrel, [place]       Future Appointments             In 2 months Burnette, Clearnce Sorrel, PA-C [employer], [facility]              lovastatin (MEVACOR) 40 MG tablet 90 tablet 0    Sig: TAKE 1 TABLET BY MOUTH EVERYDAY AT BEDTIME      Cardiovascular:  Antilipid - Statins Failed - 03/21/2020  8:48 AM      Failed - LDL in normal range and within 360 days    LDL Cholesterol (Calc)  Date Value Ref Range Status  11/17/2019 106 (H) mg/dL (calc) Final    Comment:    Reference range: <100 . Desirable range <100 mg/dL for primary prevention;   <70 mg/dL for patients with CHD or diabetic patients  with > or = 2 CHD risk factors. Marland Kitchen LDL-C is now calculated using the Martin-Hopkins  calculation, which is a validated novel method providing  better accuracy than the Friedewald equation in the  estimation of LDL-C.  Cresenciano Genre et al. Annamaria Helling. 6789;381(01): 2061-2068  (http://education.[facility].com/faq/FAQ164)           Failed - HDL in normal range and within 360 days    HDL  Date Value Ref Range Status  11/17/2019 47 (L) > OR = 50 mg/dL Final  11/09/2018 42 >39 mg/dL Final           Failed - Triglycerides in normal range and within 360  days    Triglycerides  Date Value Ref Range Status  11/17/2019 255 (H) <150 mg/dL Final    Comment:    . If a non-fasting specimen was collected, consider repeat triglyceride testing on a fasting specimen if clinically indicated.  Yates Decamp et al. J. of Clin. Lipidol. 0300;9:233-007. Marland Kitchen           Passed - Total Cholesterol in normal range and within 360 days    Cholesterol, Total  Date Value Ref Range Status  11/09/2018 194 100 - 199 mg/dL Final   Cholesterol  Date Value Ref Range Status  11/17/2019 189 <200 mg/dL Final          Passed - Patient is not pregnant      Passed - Valid encounter within last 12 months    Recent Outpatient Visits           4 months ago Wellness examination   [facility] Fenton Malling M, PA-C   5 months ago Bipolar disorder in full remission, most recent episode unspecified type [facility])   [place], [place], [place]   7 months ago Chronic idiopathic constipation   [place], [place], [place]   8 months ago Constipation, unspecified constipation type   [facility] Carles Collet M, [place]   11 months ago Hematoma   [facility] [place], Clearnce Sorrel, [place]       Future Appointments             In 2 months Burnette, Clearnce Sorrel, PA-C [employer], [facility]              traZODone (DESYREL) 100 MG tablet 90 tablet 0    Sig: TAKE 1 TABLET (100 MG TOTAL) BY MOUTH AT BEDTIME AS NEEDED FOR SLEEP.      Psychiatry: Antidepressants - Serotonin Modulator Passed - 03/21/2020  8:48 AM      Passed - Completed PHQ-2 or PHQ-9 in the last 360 days      Passed - Valid encounter within last 6 months    Recent Outpatient Visits           4 months ago Wellness examination   [place], PA-C   5 months ago Bipolar disorder in full remission,  most recent episode unspecified type [facility])   [place], [place]   7 months ago Chronic idiopathic constipation   [place], [place]   8 months ago Constipation, unspecified constipation type   [place], [place], [place]   11 months ago Hematoma   [facility] [place], Clearnce Sorrel, [place]       Future Appointments             In 2 months Burnette, Clearnce Sorrel, PA-C [employer], [place]

## 2020-04-04 ENCOUNTER — Other Ambulatory Visit: Payer: Self-pay | Admitting: Physician Assistant

## 2020-04-04 DIAGNOSIS — I10 Essential (primary) hypertension: Secondary | ICD-10-CM

## 2020-04-04 DIAGNOSIS — F419 Anxiety disorder, unspecified: Secondary | ICD-10-CM

## 2020-04-08 ENCOUNTER — Ambulatory Visit: Payer: Self-pay

## 2020-04-08 NOTE — Telephone Encounter (Signed)
Patient's husband called and says since Saturday the patient started coughing, a dry cough with very little sputum if any. He says she has mild SOB when up and walking up stairs. He says she's c/o dizziness, but it's mild as well. Denies fever, 98.8 today. He says right now they are out at a ballet recital and she's breathing fine, no distress. She says she has chest pressure when coughing and feels like it's bronchitis. MyChart video visit scheduled for tomorrow at 39 with Dr. Brita Romp, care advice given, he verbalized understanding.  Reason for Disposition . [1] MILD difficulty breathing (e.g., minimal/no SOB at rest, SOB with walking, pulse <100) AND [2] still present when not coughing  Answer Assessment - Initial Assessment Questions 1. ONSET: "When did the cough begin?"      Saturday 2. SEVERITY: "How bad is the cough today?"      Not that bad 3. SPUTUM: "Describe the color of your sputum" (none, dry cough; clear, white, yellow, green)     Nothing comes up 4. HEMOPTYSIS: "Are you coughing up any blood?" If so ask: "How much?" (flecks, streaks, tablespoons, etc.)     No 5. DIFFICULTY BREATHING: "Are you having difficulty breathing?" If Yes, ask: "How bad is it?" (e.g., mild, moderate, severe)    - MILD: No SOB at rest, mild SOB with walking, speaks normally in sentences, can lay down, no retractions, pulse < 100.    - MODERATE: SOB at rest, SOB with minimal exertion and prefers to sit, cannot lie down flat, speaks in phrases, mild retractions, audible wheezing, pulse 100-120.    - SEVERE: Very SOB at rest, speaks in single words, struggling to breathe, sitting hunched forward, retractions, pulse > 120      Mild 6. FEVER: "Do you have a fever?" If Yes, ask: "What is your temperature, how was it measured, and when did it start?"     No 7. CARDIAC HISTORY: "Do you have any history of heart disease?" (e.g., heart attack, congestive heart failure)      No 8. LUNG HISTORY: "Do you have any  history of lung disease?"  (e.g., pulmonary embolus, asthma, emphysema)     No 9. PE RISK FACTORS: "Do you have a history of blood clots?" (or: recent major surgery, recent prolonged travel, bedridden)     No 10. OTHER SYMPTOMS: "Do you have any other symptoms?" (e.g., runny nose, wheezing, chest pain)      Chest pressure, dizziness 11. PREGNANCY: "Is there any chance you are pregnant?" "When was your last menstrual period?"       No 12. TRAVEL: "Have you traveled out of the country in the last month?" (e.g., travel history, exposures)       No  Protocols used: COUGH - ACUTE NON-PRODUCTIVE-A-AH

## 2020-04-09 ENCOUNTER — Telehealth (INDEPENDENT_AMBULATORY_CARE_PROVIDER_SITE_OTHER): Payer: No Typology Code available for payment source | Admitting: Family Medicine

## 2020-04-09 ENCOUNTER — Encounter: Payer: Self-pay | Admitting: Family Medicine

## 2020-04-09 DIAGNOSIS — J441 Chronic obstructive pulmonary disease with (acute) exacerbation: Secondary | ICD-10-CM

## 2020-04-09 MED ORDER — PREDNISONE 20 MG PO TABS: 40.0000 mg | ORAL_TABLET | Freq: Every day | ORAL | 0 refills | Status: AC

## 2020-04-09 MED ORDER — ALBUTEROL SULFATE HFA 108 (90 BASE) MCG/ACT IN AERS: 2.0000 | INHALATION_SPRAY | Freq: Four times a day (QID) | RESPIRATORY_TRACT | 0 refills | Status: DC | PRN

## 2020-04-09 MED ORDER — AZITHROMYCIN 250 MG PO TABS: ORAL_TABLET | ORAL | 0 refills | Status: DC

## 2020-04-09 NOTE — Progress Notes (Signed)
MyChart Video Visit    Virtual Visit via Video Note   This visit type was conducted due to national recommendations for restrictions regarding the COVID-19 Pandemic (e.g. social distancing) in an effort to limit this patient's exposure and mitigate transmission in our community. This patient is at least at moderate risk for complications without adequate follow up. This format is felt to be most appropriate for this patient at this time. Physical exam was limited by quality of the video and audio technology used for the visit.    Patient location: home Provider location: [place] involved in the visit: patient, provider   I discussed the limitations of evaluation and management by telemedicine and the availability of in person appointments. The patient expressed understanding and agreed to proceed.  Patient: Stephanie Ball   DOB: December 20, 1955   64 y.o. Female  MRN: [id number] Visit Date: 04/09/2020  Today's healthcare provider: Lavon Paganini, MD   Chief Complaint  Patient presents with  . Cough   Subjective    Cough This is a recurrent problem. The current episode started more than 1 month ago. The problem has been gradually worsening. The problem occurs every few hours. The cough is productive of sputum. Associated symptoms include headaches, nasal congestion, postnasal drip and shortness of breath. Pertinent negatives include no chest pain, chills, ear congestion, ear pain, fever, hemoptysis, myalgias, rhinorrhea, sore throat, sweats or wheezing. The symptoms are aggravated by lying down. Risk factors for lung disease include smoking/tobacco exposure. She has tried nothing for the symptoms.    Reports recurrent bronchitis Chest hurting after coughing so much  Never been diagnosed with COPD or asthma, but   Smoking less currently but has smoked Has had bronchitis 2-3 times this year. Has purulent sputum, wheezing, chest tightness.   Patient  Active Problem List   Diagnosis Date Noted  . Class 1 obesity due to excess calories with serious comorbidity and body mass index (BMI) of 30.0 to 30.9 in adult 11/21/2019  . Encounter for screening mammogram for malignant neoplasm of breast 11/21/2019  . Colon cancer screening 11/21/2019  . Wellness examination 11/21/2019  . Constipation 08/17/2019  . Acute gouty arthritis 02/10/2016  . Insomnia 12/07/2014  . Anxiety 12/06/2014  . Allergic rhinitis 12/06/2014  . 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 Abnormal LFTs 12/06/2014  . Bloodgood disease 12/06/2014  . History of hydronephrosis 12/06/2014  . Blood glucose elevated 12/06/2014  . Decreased potassium in the blood 12/06/2014  . Arterial blood pressure decreased 12/06/2014  . History of panic attacks 12/06/2014  . Breath shortness 12/06/2014  . Abnormal mammogram 12/06/2014  . Chronic kidney disease (CKD), stage II (mild) 08/29/2012  . History of metabolic disorder 98/33/8250  . Kidney cysts 08/29/2012  . Calcium blood increased 07/19/2009  . Avitaminosis D 02/07/2009  . Hypercholesterolemia without hypertriglyceridemia 01/29/2009  . Compulsive tobacco user syndrome 09/21/2008  . Affective bipolar disorder ([place]) 08/24/2008  . Clinical depression 08/24/2008  . Endometriosis 08/24/2008  . Blood in the urine 08/24/2008  . Benign essential HTN 08/24/2008  . Syncope and collapse 08/24/2008   Social History   Tobacco Use  . Smoking status: Current Every Day Smoker    Packs/day: 0.50  . Smokeless tobacco: Never Used  . Tobacco comment: would like to quit; as stated pt smoked 1/2 PPd for 25 years and now is smoking 5-6 cigarettes per day.  Vaping Use  . Vaping Use: Never used  Substance Use Topics  . Alcohol use: No  .  Drug use: No   Allergies  Allergen Reactions  . Aciphex  [Rabeprazole Sodium]   . Amoxicillin-Pot Clavulanate   . Iron   . Macrobid  [employer] Macro]   . Metoprolol Other (See Comments)    After  taking medication for about 72 hours she noted elevated systolic BP and swelling of hands and feet and discontinued.      Medications: Outpatient Medications Prior to Visit  Medication Sig  . aspirin EC 81 MG tablet Take 81 mg by mouth daily.  . busPIRone (BUSPAR) 15 MG tablet TAKE 1 TABLET (15 MG TOTAL) BY MOUTH 2 (TWO) TIMES DAILY.  . busPIRone (BUSPAR) 30 MG tablet TAKE 1 TABLET (30 MG TOTAL) BY MOUTH 2 (TWO) TIMES DAILY.  . clotrimazole-betamethasone (LOTRISONE) cream APPLY TOPICALLY 2 TIMES DAILY  . colchicine 0.6 MG tablet Take 2 tabs PO at onset of symptoms, and one tab PO daily until symptoms resolve. Repeat as needed  . famotidine (PEPCID) 20 MG tablet Take 20 mg by mouth 2 (two) times daily.  . hydrochlorothiazide (HYDRODIURIL) 12.5 MG tablet Take 1 tablet (12.5 mg total) by mouth daily.  Marland Kitchen linaclotide (LINZESS) 290 MCG CAPS capsule Take 1 capsule (290 mcg total) by mouth daily before breakfast.  . lisinopril (ZESTRIL) 40 MG tablet TAKE 1 TABLET BY MOUTH EVERY DAY  . loratadine (CLARITIN) 10 MG tablet Take 10 mg by mouth daily.  Marland Kitchen lovastatin (MEVACOR) 40 MG tablet TAKE 1 TABLET BY MOUTH EVERYDAY AT BEDTIME  . montelukast (SINGULAIR) 10 MG tablet TAKE 1 TABLET BY MOUTH EVERY DAY  . OLANZapine (ZYPREXA) 15 MG tablet TAKE 1 TABLET BY MOUTH EVERY DAY  . traZODone (DESYREL) 100 MG tablet TAKE 1 TABLET (100 MG TOTAL) BY MOUTH AT BEDTIME AS NEEDED FOR SLEEP.  Marland Kitchen venlafaxine (EFFEXOR) 75 MG tablet TAKE 1 TABLET (75 MG TOTAL) BY MOUTH 3 (THREE) TIMES DAILY WITH MEALS.  Marland Kitchen Vitamin D, Ergocalciferol, (DRISDOL) 1.25 MG (50000 UNIT) CAPS capsule TAKE 1 CAPSULE (50,000 UNITS TOTAL) BY MOUTH EVERY 7 (SEVEN) DAYS.  Marland Kitchen zolpidem (AMBIEN) 10 MG tablet TAKE 1 TABLET BY MOUTH AT BEDTIME AS NEEDED FOR SLEEP.   No facility-administered medications prior to visit.    Review of Systems  Constitutional: Negative for chills and fever.  HENT: Positive for postnasal drip. Negative for ear pain, rhinorrhea  and sore throat.   Respiratory: Positive for cough and shortness of breath. Negative for hemoptysis and wheezing.   Cardiovascular: Negative for chest pain.  Musculoskeletal: Negative for myalgias.  Neurological: Positive for headaches.    Last CBC Lab Results  Component Value Date   WBC 8.3 11/17/2019   HGB 14.3 11/17/2019   HCT 42.0 11/17/2019   MCV 88.2 11/17/2019   MCH 30.0 11/17/2019   RDW 14.6 11/17/2019   PLT 295 11/17/2019      Objective    There were no vitals taken for this visit. BP Readings from Last 3 Encounters:  11/17/19 117/82  10/13/19 (!) 147/93  08/18/19 121/83   Wt Readings from Last 3 Encounters:  11/17/19 172 lb 9.6 oz (78.3 kg)  10/13/19 174 lb (78.9 kg)  08/18/19 172 lb (78 kg)      Physical Exam Constitutional:      General: She is not in acute distress.    Appearance: Normal appearance.  HENT:     Head: Normocephalic and atraumatic.  Eyes:     General: No scleral icterus.    Conjunctiva/sclera: Conjunctivae normal.  Pulmonary:  Effort: Pulmonary effort is normal. No respiratory distress.  Neurological:     Mental Status: She is alert and oriented to person, place, and time.        Assessment & Plan     1. COPD exacerbation ([place]) -Patient with recurrent episodes of bronchitis, now with productive cough, wheeze, chest congestion in the absence of fever - Concerning for chronic bronchitis with acute exacerbation currently - Patient does have significant smoking history, which also places her at risk for COPD -Treat COPD exacerbation currently with azithromycin and prednisone course -Albuterol as needed -Encourage smoking cessation -Discussed return precautions -At follow-up with PCP, consider controller inhaler to prevent recurrent episodes and exacerbations    No follow-ups on file.     Total time spent on today's visit was greater than 30 minutes, including both virtual face-to-face time and nonface-to-face time  personally spent on review of chart (labs and imaging), discussing labs and goals, discussing further work-up, treatment options, referrals to specialist if needed, reviewing outside records of pertinent, answering patient's questions, and coordinating care.    I discussed the assessment and treatment plan with the patient. The patient was provided an opportunity to ask questions and all were answered. The patient agreed with the plan and demonstrated an understanding of the instructions.   The patient was advised to call back or seek an in-person evaluation if the symptoms worsen or if the condition fails to improve as anticipated.  I, Lavon Paganini, MD, have reviewed all documentation for this visit. The documentation on 04/09/20 for the exam, diagnosis, procedures, and orders are all accurate and complete.   Calem Cocozza, Dionne Bucy, MD, MPH [facility]

## 2020-04-12 ENCOUNTER — Other Ambulatory Visit: Payer: Self-pay | Admitting: Physician Assistant

## 2020-04-12 DIAGNOSIS — F319 Bipolar disorder, unspecified: Secondary | ICD-10-CM

## 2020-04-12 NOTE — Telephone Encounter (Signed)
Requested medications are due for refill today yes  Requested medications are on the active medication list yes  Last refill 9/16  Last visit 10/2019  Future visit scheduled 05/2020  Notes to clinic Not Delegated.

## 2020-04-18 ENCOUNTER — Other Ambulatory Visit: Payer: Self-pay | Admitting: Physician Assistant

## 2020-04-18 ENCOUNTER — Other Ambulatory Visit: Payer: Self-pay | Admitting: Family Medicine

## 2020-04-18 DIAGNOSIS — E7800 Pure hypercholesterolemia, unspecified: Secondary | ICD-10-CM

## 2020-04-18 NOTE — Telephone Encounter (Signed)
Called and spoke with patient.  She states that she is not having worsening of her breathing symptom.  She did not request refill. She understands that request will be routed to office for consideration.

## 2020-04-18 NOTE — Telephone Encounter (Signed)
Requested Prescriptions  Pending Prescriptions Disp Refills  . lovastatin (MEVACOR) 40 MG tablet [Pharmacy Med Name: LOVASTATIN 40 MG TABLET] 90 tablet 2    Sig: TAKE 1 TABLET BY MOUTH EVERYDAY AT BEDTIME     Cardiovascular:  Antilipid - Statins Failed - 04/18/2020  9:54 AM      Failed - LDL in normal range and within 360 days    LDL Cholesterol (Calc)  Date Value Ref Range Status  11/17/2019 106 (H) mg/dL (calc) Final    Comment:    Reference range: <100 . Desirable range <100 mg/dL for primary prevention;   <70 mg/dL for patients with CHD or diabetic patients  with > or = 2 CHD risk factors. Marland Kitchen LDL-C is now calculated using the Martin-Hopkins  calculation, which is a validated novel method providing  better accuracy than the Friedewald equation in the  estimation of LDL-C.  Cresenciano Genre et al. Annamaria Helling. 2751;700(17): 2061-2068  (http://education.[facility].com/faq/FAQ164)          Failed - HDL in normal range and within 360 days    HDL  Date Value Ref Range Status  11/17/2019 47 (L) > OR = 50 mg/dL Final  11/09/2018 42 >39 mg/dL Final         Failed - Triglycerides in normal range and within 360 days    Triglycerides  Date Value Ref Range Status  11/17/2019 255 (H) <150 mg/dL Final    Comment:    . If a non-fasting specimen was collected, consider repeat triglyceride testing on a fasting specimen if clinically indicated.  Yates Decamp et al. J. of Clin. Lipidol. 4944;9:675-916. Marland Kitchen          Passed - Total Cholesterol in normal range and within 360 days    Cholesterol, Total  Date Value Ref Range Status  11/09/2018 194 100 - 199 mg/dL Final   Cholesterol  Date Value Ref Range Status  11/17/2019 189 <200 mg/dL Final         Passed - Patient is not pregnant      Passed - Valid encounter within last 12 months    Recent Outpatient Visits          1 week ago COPD exacerbation [facility])   [facility] [place], Dionne Bucy, MD   5 months ago Wellness  examination   [facility] Fenton Malling M, [place]   6 months ago Bipolar disorder in full remission, most recent episode unspecified type [facility])   [place], [place]   8 months ago Chronic idiopathic constipation   [place], [place]   9 months ago Constipation, unspecified constipation type   [place], Wendee Beavers, PA-C      Future Appointments            In 1 month Burnette, Clearnce Sorrel, PA-C [employer], [place]

## 2020-04-29 ENCOUNTER — Ambulatory Visit: Payer: Self-pay | Admitting: *Deleted

## 2020-04-29 ENCOUNTER — Ambulatory Visit
Admission: EM | Admit: 2020-04-29 | Discharge: 2020-04-29 | Discharge status: Against Medical Advice | Payer: No Typology Code available for payment source

## 2020-04-29 NOTE — Telephone Encounter (Addendum)
Patient called for appointment for drycough with chest soreness.Occasional wheeze and short winded with talking.  No phlegm will come up. Occasionally gets light headed with standing from sitting.Decreased appetite since October with wt. Loss from 172 now 155.  Denies all other symptoms but stated she has a long hx with bronchitis. Temperature today 99. 8 with tympanic thermometer today. Completed antibiotic and steroid about 2 weeks ago and inhaler not helping with using regularly now. Care advice including take mucinex with 4 glasses of water twice daily. Virtual appointment made by agent for tomorrow with Dortha Kern, PA. Reviewed when to seek immediate evaluation at the UC/ED. Patient stated understanding and agrees with plan.    Reason for Disposition . [1] MODERATE longstanding difficulty breathing (e.g., speaks in phrases, SOB even at rest, pulse 100-120) AND [2] SAME as normal  Answer Assessment - Initial Assessment Questions 1. RESPIRATORY STATUS: "Describe your breathing?" (e.g., wheezing, shortness of breath, unable to speak, severe coughing)      When she stands she gets dizzy 2. ONSET: "When did this breathing problem begin?"     Last week 3. PATTERN "Does the difficult breathing come and go, or has it been constant since it started?"      Only when standing up 4. SEVERITY: "How bad is your breathing?" (e.g., mild, moderate, severe)    - MILD: No SOB at rest, mild SOB with walking, speaks normally in sentences, can lay down, no retractions, pulse < 100.    - MODERATE: SOB at rest, SOB with minimal exertion and prefers to sit, cannot lie down flat, speaks in phrases, mild retractions, audible wheezing, pulse 100-120.    - SEVERE: Very SOB at rest, speaks in single words, struggling to breathe, sitting hunched forward, retractions, pulse > 120      Wheezing a little , chest feels heavy 5. RECURRENT SYMPTOM: "Have you had difficulty breathing before?" If Yes, ask: "When was the last  time?" and "What happened that time?"      no 6. CARDIAC HISTORY: "Do you have any history of heart disease?" (e.g., heart attack, angina, bypass surgery, angioplasty)      no 7. LUNG HISTORY: "Do you have any history of lung disease?"  (e.g., pulmonary embolus, asthma, emphysema)     Gets bronchitis alot 8. CAUSE: "What do you think is causing the breathing problem?"     Maybe bronchitits 9. OTHER SYMPTOMS: "Do you have any other symptoms? (e.g., dizziness, runny nose, cough, chest pain, fever)     99.8 tympanic today. 10. PREGNANCY: "Is there any chance you are pregnant?" "When was your last menstrual period?"       na 11. TRAVEL: "Have you traveled out of the country in the last month?" (e.g., travel history, exposures)       no  Protocols used: BREATHING DIFFICULTY-A-AH  It was only after we completed the triage call did I see in the patient's chart that she is currently at a [facility] clinic in [place].

## 2020-04-29 NOTE — ED Notes (Signed)
Called pt she states she left.

## 2020-04-30 ENCOUNTER — Encounter: Payer: Self-pay | Admitting: Family Medicine

## 2020-04-30 ENCOUNTER — Other Ambulatory Visit: Payer: Self-pay

## 2020-04-30 ENCOUNTER — Ambulatory Visit (INDEPENDENT_AMBULATORY_CARE_PROVIDER_SITE_OTHER): Payer: No Typology Code available for payment source | Admitting: Family Medicine

## 2020-04-30 DIAGNOSIS — R5383 Other fatigue: Secondary | ICD-10-CM

## 2020-04-30 DIAGNOSIS — J209 Acute bronchitis, unspecified: Secondary | ICD-10-CM | POA: Diagnosis not present

## 2020-04-30 DIAGNOSIS — R5381 Other malaise: Secondary | ICD-10-CM | POA: Diagnosis not present

## 2020-04-30 DIAGNOSIS — J440 Chronic obstructive pulmonary disease with acute lower respiratory infection: Secondary | ICD-10-CM | POA: Diagnosis not present

## 2020-04-30 NOTE — Progress Notes (Signed)
Virtual Visit via Telephone Note  I connected with Stephanie Ball on 04/30/20 at  3:20 PM EST by telephone and verified that I am speaking with the correct person using two identifiers.  Location: Patient: Home Provider: Office   I discussed the limitations, risks, security and privacy concerns of performing an evaluation and management service by telephone and the availability of in person appointments. I also discussed with the patient that there may be a patient responsible charge related to this service. The patient expressed understanding and agreed to proceed.   History of Present Illness:  Pt saw Dr. Brita Romp on 04/09/20 and was diagnosed with COPD exacerbation ([place]) and it was noted that the pt has recurrent episodes of bronchitis. Pt reports sob and dizziness upon standing.   Observations/Objective: Past Medical History:  Diagnosis Date  . Bipolar disorder ([place]) 08/24/2008  . Chronic kidney disease   . Depressive disorder 08/24/2008  . Disorder of kidney and ureter 07/19/2009  . Fibrocystic breast disease   . Hematuria 08/24/2008  . Hydronephrosis of left kidney   . Hypercalcemia 07/19/2009  . Hyperglycemia   . Hyperlipidemia 08/24/2008  . Hypertension 08/24/2008   essential, benign  . Hypokalemia   . Panic attack   . Pure hypercholesterolemia 09/28//2010   Past Surgical History:  Procedure Laterality Date  . ABDOMINAL HYSTERECTOMY     abdominal:due to fibroid and endometriosis. No history of abnormal paps. Ovaries removed also.  Marland Kitchen BREAST BIOPSY Right 2012   FIBROEPITHEIAL LESION WITH FLORID DUCTAL  . CHOLECYSTECTOMY     no further information given  . KIDNEY SURGERY     as stated pt had kidney surgery with no further given  . OOPHORECTOMY Bilateral 1996   Dr, DeFrancisco  . TUBAL LIGATION     Family History  Problem Relation Age of Onset  . Anuerysm Mother   . Diabetes Father   . Cancer Paternal Grandmother        skin  . Healthy Sister   .  Healthy Daughter   . Healthy Sister   . Breast cancer Neg Hx    Social History   Tobacco Use  . Smoking status: Current Every Day Smoker    Packs/day: 0.50  . Smokeless tobacco: Never Used  . Tobacco comment: would like to quit; as stated pt smoked 1/2 PPd for 25 years and now is smoking 5-6 cigarettes per day.  Vaping Use  . Vaping Use: Never used  Substance Use Topics  . Alcohol use: No  . Drug use: No   Allergies  Allergen Reactions  . Aciphex  [Rabeprazole Sodium]   . Amoxicillin-Pot Clavulanate   . Iron   . Macrobid  [employer] Macro]   . Metoprolol Other (See Comments)    After taking medication for about 72 hours she noted elevated systolic BP and swelling of hands and feet and discontinued.   Current Outpatient Medications on File Prior to Visit  Medication Sig Dispense Refill  . albuterol (VENTOLIN HFA) 108 (90 Base) MCG/ACT inhaler TAKE 2 PUFFS BY MOUTH EVERY 6 HOURS AS NEEDED FOR WHEEZE OR SHORTNESS OF BREATH 18 each 5  . aspirin EC 81 MG tablet Take 81 mg by mouth daily.    Marland Kitchen azithromycin (ZITHROMAX) 250 MG tablet Take 500mg  PO daily x1d and then 250mg  daily x4 days 6 each 0  . busPIRone (BUSPAR) 15 MG tablet TAKE 1 TABLET (15 MG TOTAL) BY MOUTH 2 (TWO) TIMES DAILY. 180 tablet 0  . busPIRone (BUSPAR) 30  MG tablet TAKE 1 TABLET (30 MG TOTAL) BY MOUTH 2 (TWO) TIMES DAILY. 180 tablet 0  . clotrimazole-betamethasone (LOTRISONE) cream APPLY TOPICALLY 2 TIMES DAILY 30 g 5  . colchicine 0.6 MG tablet Take 2 tabs PO at onset of symptoms, and one tab PO daily until symptoms resolve. Repeat as needed 90 tablet 1  . famotidine (PEPCID) 20 MG tablet Take 20 mg by mouth 2 (two) times daily.    . hydrochlorothiazide (HYDRODIURIL) 12.5 MG tablet Take 1 tablet (12.5 mg total) by mouth daily. 90 tablet 1  . linaclotide (LINZESS) 290 MCG CAPS capsule Take 1 capsule (290 mcg total) by mouth daily before breakfast. 90 capsule 3  . lisinopril (ZESTRIL) 40 MG tablet TAKE 1  TABLET BY MOUTH EVERY DAY 90 tablet 0  . loratadine (CLARITIN) 10 MG tablet Take 10 mg by mouth daily.    Marland Kitchen lovastatin (MEVACOR) 40 MG tablet TAKE 1 TABLET BY MOUTH EVERYDAY AT BEDTIME 90 tablet 2  . montelukast (SINGULAIR) 10 MG tablet TAKE 1 TABLET BY MOUTH EVERY DAY 90 tablet 3  . OLANZapine (ZYPREXA) 15 MG tablet TAKE 1 TABLET BY MOUTH EVERY DAY 90 tablet 2  . traZODone (DESYREL) 100 MG tablet TAKE 1 TABLET (100 MG TOTAL) BY MOUTH AT BEDTIME AS NEEDED FOR SLEEP. 90 tablet 0  . venlafaxine (EFFEXOR) 75 MG tablet TAKE 1 TABLET (75 MG TOTAL) BY MOUTH 3 (THREE) TIMES DAILY WITH MEALS. 270 tablet 0  . Vitamin D, Ergocalciferol, (DRISDOL) 1.25 MG (50000 UNIT) CAPS capsule TAKE 1 CAPSULE (50,000 UNITS TOTAL) BY MOUTH EVERY 7 (SEVEN) DAYS. 12 capsule 3  . zolpidem (AMBIEN) 10 MG tablet TAKE 1 TABLET BY MOUTH AT BEDTIME AS NEEDED FOR SLEEP. 90 tablet 1  . [DISCONTINUED] busPIRone (BUSPAR) 15 MG tablet Take 1 tablet (15 mg total) by mouth 2 (two) times daily. 180 tablet 3   No current facility-administered medications on file prior to visit.   During telephonic interview, patient has an intermittent cough without acute respiratory distress. Oriented and cooperative.  Assessment and Plan: 1. Acute bronchitis with COPD (HCC) Persistent heaviness in chest without sputum production despite treatment with albuterol, prednisone, Mucinex plain and Z-pak on 04-09-20. No fever, loss of taste or head congestion. Will get COVID test at a pharmacy this evening and schedule for CXR tomorrow. Continue increased fluid intake and Mucinex.  - DG Chest 2 View; Future  2. Malaise and fatigue States this had been a problem since she started having bronchitis in October 2021. Recheck pending x-ray report and increased fluid intake.   Follow Up Instructions:    I discussed the assessment and treatment plan with the patient. The patient was provided an opportunity to ask questions and all were answered. The patient  agreed with the plan and demonstrated an understanding of the instructions.   The patient was advised to call back or seek an in-person evaluation if the symptoms worsen or if the condition fails to improve as anticipated.  I provided 20 minutes of non-face-to-face time during this encounter.  I, Nate Common, PA-C, have reviewed all documentation for this visit. The documentation on 04/30/20 for the exam, diagnosis, procedures, and orders are all accurate and complete.  Paticia Stack, CMA

## 2020-05-01 ENCOUNTER — Ambulatory Visit
Admission: RE | Admit: 2020-05-01 | Discharge: 2020-05-01 | Disposition: A | Discharge status: Home / Self Care | Payer: No Typology Code available for payment source | Attending: Family Medicine | Admitting: Family Medicine

## 2020-05-01 ENCOUNTER — Other Ambulatory Visit: Payer: Self-pay

## 2020-05-01 ENCOUNTER — Ambulatory Visit
Admission: RE | Admit: 2020-05-01 | Discharge: 2020-05-01 | Disposition: A | Discharge status: Home / Self Care | Payer: No Typology Code available for payment source | Source: Ambulatory Visit | Attending: Family Medicine | Admitting: Family Medicine

## 2020-05-01 DIAGNOSIS — J209 Acute bronchitis, unspecified: Secondary | ICD-10-CM | POA: Insufficient documentation

## 2020-05-01 DIAGNOSIS — J440 Chronic obstructive pulmonary disease with acute lower respiratory infection: Secondary | ICD-10-CM | POA: Insufficient documentation

## 2020-05-01 DIAGNOSIS — B349 Viral infection, unspecified: Secondary | ICD-10-CM

## 2020-05-02 ENCOUNTER — Telehealth: Payer: Self-pay | Admitting: Physician Assistant

## 2020-05-02 NOTE — Telephone Encounter (Signed)
Patient advised of results.

## 2020-05-02 NOTE — Telephone Encounter (Signed)
Patient called in to check the results for her chest xrays  C/b # [id number]

## 2020-05-03 LAB — NOVEL CORONAVIRUS, NAA: SARS-CoV-2, NAA: NOT DETECTED

## 2020-05-03 LAB — SPECIMEN STATUS REPORT

## 2020-05-03 LAB — SARS-COV-2, NAA 2 DAY TAT

## 2020-05-10 ENCOUNTER — Ambulatory Visit: Payer: No Typology Code available for payment source | Admitting: Physician Assistant

## 2020-05-16 ENCOUNTER — Other Ambulatory Visit: Payer: Self-pay | Admitting: Physician Assistant

## 2020-05-16 DIAGNOSIS — F5101 Primary insomnia: Secondary | ICD-10-CM

## 2020-05-16 DIAGNOSIS — E559 Vitamin D deficiency, unspecified: Secondary | ICD-10-CM

## 2020-05-16 NOTE — Telephone Encounter (Signed)
Requested medication (s) are due for refill today:  no  Requested medication (s) are on the active medication list:  yes  Last refill:  05/07/2020  Future visit scheduled: yes  Notes to clinic:  this refill cannot be delegated    Requested Prescriptions  Pending Prescriptions Disp Refills   Vitamin D, Ergocalciferol, (DRISDOL) 1.25 MG (50000 UNIT) CAPS capsule [Pharmacy Med Name: VITAMIN D2 1.25MG (50,000 UNIT)] 12 capsule 3    Sig: TAKE 1 CAPSULE (50,000 UNITS TOTAL) BY MOUTH EVERY 7 (SEVEN) DAYS.      Endocrinology:  Vitamins - Vitamin D Supplementation Failed - 05/16/2020  8:28 AM      Failed - 50,000 IU strengths are not delegated      Failed - Ca in normal range and within 360 days    Calcium  Date Value Ref Range Status  11/17/2019 11.7 (H) 8.6 - 10.4 mg/dL Final          Failed - Phosphate in normal range and within 360 days    No results found for: PHOS        Failed - Vitamin D in normal range and within 360 days    Vit D, 25-Hydroxy  Date Value Ref Range Status  11/17/2019 123 (H) 30 - 100 ng/mL Final    Comment:    Vitamin D Status         25-OH Vitamin D: . Deficiency:                    <20 ng/mL Insufficiency:             20 - 29 ng/mL Optimal:                 > or = 30 ng/mL . For 25-OH Vitamin D testing on patients on  D2-supplementation and patients for whom quantitation  of D2 and D3 fractions is required, the QuestAssureD(TM) 25-OH VIT D, (D2,D3), LC/MS/MS is recommended: order  code [phone] (patients >98yrs). See Note 1 . Note 1 . For additional information, please refer to  http://education.QuestDiagnostics.com/faq/FAQ199  (This link is being provided for informational/ educational purposes only.)           Passed - Valid encounter within last 12 months    Recent Outpatient Visits           2 weeks ago Acute bronchitis with COPD [facility])   [place], Vickki Muff, PA-C   1 month ago COPD exacerbation [facility])   [facility] [place], Dionne Bucy, [place]   6 months ago Wellness examination   [facility] Fenton Malling M, [place]   7 months ago Bipolar disorder in full remission, most recent episode unspecified type [facility])   [place], [place], [place]   9 months ago Chronic idiopathic constipation   [place], Clearnce Sorrel, [place]       Future Appointments             Tomorrow Marlyn Corporal, Clearnce Sorrel, PA-C [facility]   In 1 week Marlyn Corporal, Clearnce Sorrel, PA-C [facility]              Signed Prescriptions Disp Refills   traZODone (DESYREL) 100 MG tablet 90 tablet 0    Sig: TAKE 1 TABLET (100 MG TOTAL) BY MOUTH AT BEDTIME AS NEEDED FOR SLEEP.      Psychiatry: Antidepressants - Serotonin Modulator Passed - 05/16/2020  8:28 AM  Passed - Completed PHQ-2 or PHQ-9 in the last 360 days      Passed - Valid encounter within last 6 months    Recent Outpatient Visits           2 weeks ago Acute bronchitis with COPD [facility])   [place], Vickki Muff, PA-C   1 month ago COPD exacerbation [facility])   [facility] [place], Dionne Bucy, MD   6 months ago Wellness examination   [facility] Fenton Malling M, [place]   7 months ago Bipolar disorder in full remission, most recent episode unspecified type [facility])   [place], [place]   9 months ago Chronic idiopathic constipation   [place], Clearnce Sorrel, [place]       Future Appointments             Tomorrow [place], Clearnce Sorrel, PA-C [facility], [place]   In 1 week Marlyn Corporal, Clearnce Sorrel, PA-C [facility], [place]

## 2020-05-17 ENCOUNTER — Ambulatory Visit (INDEPENDENT_AMBULATORY_CARE_PROVIDER_SITE_OTHER): Payer: No Typology Code available for payment source | Admitting: Physician Assistant

## 2020-05-17 ENCOUNTER — Encounter: Payer: Self-pay | Admitting: Physician Assistant

## 2020-05-17 ENCOUNTER — Other Ambulatory Visit: Payer: Self-pay

## 2020-05-17 VITALS — BP 137/82 | HR 86 | Temp 98.6°F | Wt 157.6 lb

## 2020-05-17 DIAGNOSIS — F410 Panic disorder [episodic paroxysmal anxiety] without agoraphobia: Secondary | ICD-10-CM

## 2020-05-17 DIAGNOSIS — R42 Dizziness and giddiness: Secondary | ICD-10-CM

## 2020-05-17 DIAGNOSIS — M1029 Drug-induced gout, multiple sites: Secondary | ICD-10-CM

## 2020-05-17 MED ORDER — HYDROXYZINE HCL 25 MG PO TABS
25.0000 mg | ORAL_TABLET | Freq: Three times a day (TID) | ORAL | 0 refills | Status: DC | PRN
Start: 2020-05-17 — End: 2021-02-25

## 2020-05-17 MED ORDER — COLCHICINE 0.6 MG PO TABS: ORAL_TABLET | ORAL | 1 refills | Status: AC

## 2020-05-17 NOTE — Patient Instructions (Signed)
Hydroxyzine capsules or tablets What is this medicine? HYDROXYZINE (hye [place] i zeen) is an antihistamine. This medicine is used to treat allergy symptoms. It is also used to treat anxiety and tension. This medicine can be used with other medicines to induce sleep before surgery. This medicine may be used for other purposes; ask your health care provider or pharmacist if you have questions. COMMON BRAND NAME(S): ANX, Atarax, Rezine, Vistaril What should I tell my health care provider before I take this medicine? They need to know if you have any of these conditions:  glaucoma  heart disease  history of irregular heartbeat  kidney disease  liver disease  lung or breathing disease, like asthma  stomach or intestine problems  thyroid disease  trouble passing urine  an unusual or allergic reaction to hydroxyzine, cetirizine, other medicines, foods, dyes or preservatives  pregnant or trying to get pregnant  breast-feeding How should I use this medicine? Take this medicine by mouth with a full glass of water. Follow the directions on the prescription label. You may take this medicine with food or on an empty stomach. Take your medicine at regular intervals. Do not take your medicine more often than directed. Talk to your pediatrician regarding the use of this medicine in children. Special care may be needed. While this drug may be prescribed for children as young as 73 years of age for selected conditions, precautions do apply. Patients over 64 years old may have a stronger reaction and need a smaller dose. Overdosage: If you think you have taken too much of this medicine contact a poison control center or emergency room at once. NOTE: This medicine is only for you. Do not share this medicine with others. What if I miss a dose? If you miss a dose, take it as soon as you can. If it is almost time for your next dose, take only that dose. Do not take double or extra doses. What may  interact with this medicine? Do not take this medicine with any of the following medications:  cisapride  dronedarone  pimozide  thioridazine This medicine may also interact with the following medications:  alcohol  antihistamines for allergy, cough, and cold  atropine  barbiturate medicines for sleep or seizures, like phenobarbital  certain antibiotics like erythromycin or clarithromycin  certain medicines for anxiety or sleep  certain medicines for bladder problems like oxybutynin, tolterodine  certain medicines for depression or psychotic disturbances  certain medicines for irregular heart beat  certain medicines for Parkinson's disease like benztropine, trihexyphenidyl  certain medicines for seizures like phenobarbital, primidone  certain medicines for stomach problems like dicyclomine, hyoscyamine  certain medicines for travel sickness like scopolamine  ipratropium  narcotic medicines for pain  other medicines that prolong the QT interval (an abnormal heart rhythm) like dofetilide This list may not describe all possible interactions. Give your health care provider a list of all the medicines, herbs, non-prescription drugs, or dietary supplements you use. Also tell them if you smoke, drink alcohol, or use illegal drugs. Some items may interact with your medicine. What should I watch for while using this medicine? Tell your doctor or health care professional if your symptoms do not improve. You may get drowsy or dizzy. Do not drive, use machinery, or do anything that needs mental alertness until you know how this medicine affects you. Do not stand or sit up quickly, especially if you are an older patient. This reduces the risk of dizzy or fainting spells. Alcohol may  interfere with the effect of this medicine. Avoid alcoholic drinks. Your mouth may get dry. Chewing sugarless gum or sucking hard candy, and drinking plenty of water may help. Contact your doctor if the  problem does not go away or is severe. This medicine may cause dry eyes and blurred vision. If you wear contact lenses you may feel some discomfort. Lubricating drops may help. See your eye doctor if the problem does not go away or is severe. If you are receiving skin tests for allergies, tell your doctor you are using this medicine. What side effects may I notice from receiving this medicine? Side effects that you should report to your doctor or health care professional as soon as possible:  allergic reactions like skin rash, itching or hives, swelling of the face, lips, or tongue  changes in vision  confusion  fast, irregular heartbeat  seizures  tremor  trouble passing urine or change in the amount of urine Side effects that usually do not require medical attention (report to your doctor or health care professional if they continue or are bothersome):  constipation  drowsiness  dry mouth  headache  tiredness This list may not describe all possible side effects. Call your doctor for medical advice about side effects. You may report side effects to FDA at 1-800-FDA-1088. Where should I keep my medicine? Keep out of the reach of children. Store at room temperature between 15 and 30 degrees C (59 and 86 degrees F). Keep container tightly closed. Throw away any unused medicine after the expiration date. NOTE: This sheet is a summary. It may not cover all possible information. If you have questions about this medicine, talk to your doctor, pharmacist, or health care provider.  2021 Elsevier/Gold Standard (2018-04-11 13:19:55)  

## 2020-05-17 NOTE — Progress Notes (Signed)
Established patient visit   Patient: Stephanie Ball   DOB: 1955-06-16   65 y.o. Female  MRN: [id number] Visit Date: 05/17/2020  Today's healthcare provider: Mar Daring, PA-C   Chief Complaint  Patient presents with  . Dizziness  I,Porsha C McClurkin,acting as a scribe for [employer], PA-C.,have documented all relevant documentation on the behalf of Mar Daring, PA-C,as directed by  Mar Daring, PA-C while in the presence of Mar Daring, PA-C.  Subjective    Dizziness This is a recurrent problem. The current episode started more than 1 month ago. The problem has been unchanged. Associated symptoms include fatigue. Pertinent negatives include no chest pain, vertigo or visual change. The symptoms are aggravated by standing and bending. She has tried nothing for the symptoms. The treatment provided no relief.    Orthostatic VS for the past 72 hrs (Last 3 readings):  Orthostatic BP Patient Position BP Location Cuff Size Orthostatic Pulse  05/17/20 1628 146/85 Standing Left Arm Large 90  05/17/20 1625 136/85 Sitting Left Arm Large 86  05/17/20 1622 132/81 Supine Left Arm Large 81   Patient does report that the dizziness actually comes on with panic attacks. She has been having increased anxiety recently. She does report most often the dizziness precipitates the dizziness.     Patient Active Problem List   Diagnosis Date Noted  . Class 1 obesity due to excess calories with serious comorbidity and body mass index (BMI) of 30.0 to 30.9 in adult 11/21/2019  . Encounter for screening mammogram for malignant neoplasm of breast 11/21/2019  . Colon cancer screening 11/21/2019  . Wellness examination 11/21/2019  . Constipation 08/17/2019  . Acute gouty arthritis 02/10/2016  . Insomnia 12/07/2014  . Anxiety 12/06/2014  . Allergic rhinitis 12/06/2014  . 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 Abnormal LFTs 12/06/2014  . Bloodgood disease 12/06/2014  .  History of hydronephrosis 12/06/2014  . Blood glucose elevated 12/06/2014  . Decreased potassium in the blood 12/06/2014  . Arterial blood pressure decreased 12/06/2014  . History of panic attacks 12/06/2014  . Breath shortness 12/06/2014  . Abnormal mammogram 12/06/2014  . Chronic kidney disease (CKD), stage II (mild) 08/29/2012  . History of metabolic disorder [id number]  . Kidney cysts 08/29/2012  . Calcium blood increased 07/19/2009  . Avitaminosis D 02/07/2009  . Hypercholesterolemia without hypertriglyceridemia 01/29/2009  . Compulsive tobacco user syndrome 09/21/2008  . Affective bipolar disorder ([place]) 08/24/2008  . Clinical depression 08/24/2008  . Endometriosis 08/24/2008  . Blood in the urine 08/24/2008  . Benign essential HTN 08/24/2008  . Syncope and collapse 08/24/2008   Past Medical History:  Diagnosis Date  . Bipolar disorder ([place]) 08/24/2008  . Chronic kidney disease   . Depressive disorder 08/24/2008  . Disorder of kidney and ureter 07/19/2009  . Fibrocystic breast disease   . Hematuria 08/24/2008  . Hydronephrosis of left kidney   . Hypercalcemia 07/19/2009  . Hyperglycemia   . Hyperlipidemia 08/24/2008  . Hypertension 08/24/2008   essential, benign  . Hypokalemia   . Panic attack   . Pure hypercholesterolemia 09/28//2010       Medications: Outpatient Medications Prior to Visit  Medication Sig  . albuterol (VENTOLIN HFA) 108 (90 Base) MCG/ACT inhaler TAKE 2 PUFFS BY MOUTH EVERY 6 HOURS AS NEEDED FOR WHEEZE OR SHORTNESS OF BREATH  . aspirin EC 81 MG tablet Take 81 mg by mouth daily.  Marland Kitchen azithromycin (ZITHROMAX) 250 MG tablet Take 500mg  PO daily  x1d and then 250mg  daily x4 days  . busPIRone (BUSPAR) 30 MG tablet TAKE 1 TABLET (30 MG TOTAL) BY MOUTH 2 (TWO) TIMES DAILY.  . clotrimazole-betamethasone (LOTRISONE) cream APPLY TOPICALLY 2 TIMES DAILY  . colchicine 0.6 MG tablet Take 2 tabs PO at onset of symptoms, and one tab PO daily until symptoms resolve.  Repeat as needed  . famotidine (PEPCID) 20 MG tablet Take 20 mg by mouth 2 (two) times daily.  . hydrochlorothiazide (HYDRODIURIL) 12.5 MG tablet Take 1 tablet (12.5 mg total) by mouth daily.  Marland Kitchen linaclotide (LINZESS) 290 MCG CAPS capsule Take 1 capsule (290 mcg total) by mouth daily before breakfast.  . lisinopril (ZESTRIL) 40 MG tablet TAKE 1 TABLET BY MOUTH EVERY DAY  . loratadine (CLARITIN) 10 MG tablet Take 10 mg by mouth daily.  Marland Kitchen lovastatin (MEVACOR) 40 MG tablet TAKE 1 TABLET BY MOUTH EVERYDAY AT BEDTIME  . montelukast (SINGULAIR) 10 MG tablet TAKE 1 TABLET BY MOUTH EVERY DAY  . OLANZapine (ZYPREXA) 15 MG tablet TAKE 1 TABLET BY MOUTH EVERY DAY  . traZODone (DESYREL) 100 MG tablet TAKE 1 TABLET (100 MG TOTAL) BY MOUTH AT BEDTIME AS NEEDED FOR SLEEP.  Marland Kitchen venlafaxine (EFFEXOR) 75 MG tablet TAKE 1 TABLET (75 MG TOTAL) BY MOUTH 3 (THREE) TIMES DAILY WITH MEALS.  Marland Kitchen Vitamin D, Ergocalciferol, (DRISDOL) 1.25 MG (50000 UNIT) CAPS capsule TAKE 1 CAPSULE (50,000 UNITS TOTAL) BY MOUTH EVERY 7 (SEVEN) DAYS.  Marland Kitchen zolpidem (AMBIEN) 10 MG tablet TAKE 1 TABLET BY MOUTH AT BEDTIME AS NEEDED FOR SLEEP.  . busPIRone (BUSPAR) 15 MG tablet TAKE 1 TABLET (15 MG TOTAL) BY MOUTH 2 (TWO) TIMES DAILY.   No facility-administered medications prior to visit.    Review of Systems  Constitutional: Positive for fatigue.  Respiratory: Negative.   Cardiovascular: Negative for chest pain.  Neurological: Positive for dizziness. Negative for vertigo.  Psychiatric/Behavioral: Positive for agitation, dysphoric mood and sleep disturbance. The patient is nervous/anxious.     Last CBC Lab Results  Component Value Date   WBC 8.3 11/17/2019   HGB 14.3 11/17/2019   HCT 42.0 11/17/2019   MCV 88.2 11/17/2019   MCH 30.0 11/17/2019   RDW 14.6 11/17/2019   PLT 295 09/98/3382   Last metabolic panel Lab Results  Component Value Date   GLUCOSE 105 (H) 11/17/2019   NA 141 11/17/2019   K 3.7 11/17/2019   CL 103 11/17/2019    CO2 27 11/17/2019   BUN 12 11/17/2019   CREATININE 1.26 (H) 11/17/2019   GFRNONAA 34 (L) 02/16/2019   GFRAA 39 (L) 02/16/2019   CALCIUM 11.7 (H) 11/17/2019   PROT 7.1 11/17/2019   ALBUMIN 4.1 11/09/2018   LABGLOB 2.3 11/09/2018   AGRATIO 1.8 11/09/2018   BILITOT 0.8 11/17/2019   ALKPHOS 77 11/09/2018   AST 16 11/17/2019   ALT 9 11/17/2019   ANIONGAP 9 02/16/2019      Objective    BP 137/82 (BP Location: Left Arm, Patient Position: Sitting, Cuff Size: Large)   Pulse 86   Temp 98.6 F (37 C) (Oral)   Wt 157 lb 9.6 oz (71.5 kg)   SpO2 98%   BMI 28.83 kg/m  BP Readings from Last 3 Encounters:  05/17/20 137/82  11/17/19 117/82  10/13/19 (!) 147/93   Wt Readings from Last 3 Encounters:  05/17/20 157 lb 9.6 oz (71.5 kg)  11/17/19 172 lb 9.6 oz (78.3 kg)  10/13/19 174 lb (78.9 kg)      Physical  Exam Vitals reviewed.  Constitutional:      General: She is not in acute distress.    Appearance: Normal appearance. She is well-developed, well-groomed, overweight and well-nourished. She is not ill-appearing or diaphoretic.  Neck:     Vascular: No carotid bruit.  Cardiovascular:     Rate and Rhythm: Normal rate and regular rhythm.     Pulses: Normal pulses.     Heart sounds: Normal heart sounds. No murmur heard. No friction rub. No gallop.   Pulmonary:     Effort: Pulmonary effort is normal. No respiratory distress.     Breath sounds: Normal breath sounds. No wheezing or rales.  Musculoskeletal:     Cervical back: Normal range of motion and neck supple. No tenderness.     Right lower leg: No edema.     Left lower leg: No edema.  Skin:    General: Skin is warm and dry.     Capillary Refill: Capillary refill takes less than 2 seconds.  Neurological:     General: No focal deficit present.     Mental Status: She is alert and oriented to person, place, and time. Mental status is at baseline.     Cranial Nerves: No cranial nerve deficit.     Motor: No weakness.      Coordination: Coordination normal.     Gait: Gait normal.  Psychiatric:        Mood and Affect: Mood normal.        Behavior: Behavior normal. Behavior is cooperative.        Thought Content: Thought content normal.        Judgment: Judgment normal.      No results found for any visits on 05/17/20.  Assessment & Plan     1. Panic attacks Will continue other medications (Buspar 30mg  BID, Zyprexa 15mg , Trazodone 100mg , Venlafaxine 75mg  TID, and Zolpidem 10mg ). Will add Hydroxyzine as below for PRN use for anxiety. Will f/u in 2-4 weeks to see if helping. May consider referral to psychiatry for medication adjustments if not improving.  - hydrOXYzine (ATARAX/VISTARIL) 25 MG tablet; Take 1 tablet (25 mg total) by mouth 3 (three) times daily as needed.  Dispense: 30 tablet; Refill: 0  2. Dizziness Will refer to ENT to make sure no inner ear or peripheral source of dizziness. Unfortunately patient is unable to tell if dizziness starts and precipitates the panic attack, or if the dizziness is a new symptom of her panic attacks. Appreciate ENT input. May consider Neurology referral as well if ENT work up is negative and dizziness persists. We did discuss vestibular migraines as patient does have history of migraines. Again consult inputs will be appreciated to help rule out sources.  - Ambulatory referral to ENT   No follow-ups on file.      Reynolds Bowl, PA-C, have reviewed all documentation for this visit. The documentation on 05/21/20 for the exam, diagnosis, procedures, and orders are all accurate and complete.   Rubye Beach  [facility] [phone] (phone) [phone] (fax)  [place]

## 2020-05-21 ENCOUNTER — Encounter: Payer: Self-pay | Admitting: Physician Assistant

## 2020-05-24 ENCOUNTER — Ambulatory Visit: Payer: Self-pay | Admitting: Physician Assistant

## 2020-05-28 ENCOUNTER — Telehealth: Payer: Self-pay

## 2020-05-28 NOTE — Telephone Encounter (Signed)
Called patient to reschedule appointment for another time from the one she was scheduled 06/07/2020 at 7 am. Per patient she is doing much better and to canceled appointment.

## 2020-05-30 ENCOUNTER — Other Ambulatory Visit: Payer: Self-pay | Admitting: Physician Assistant

## 2020-05-30 DIAGNOSIS — F3170 Bipolar disorder, currently in remission, most recent episode unspecified: Secondary | ICD-10-CM

## 2020-06-07 ENCOUNTER — Ambulatory Visit: Payer: Self-pay | Admitting: Physician Assistant

## 2020-06-27 ENCOUNTER — Other Ambulatory Visit: Payer: Self-pay | Admitting: Physician Assistant

## 2020-06-27 DIAGNOSIS — F419 Anxiety disorder, unspecified: Secondary | ICD-10-CM

## 2020-07-11 ENCOUNTER — Other Ambulatory Visit: Payer: Self-pay | Admitting: Physician Assistant

## 2020-07-11 DIAGNOSIS — I10 Essential (primary) hypertension: Secondary | ICD-10-CM

## 2020-07-25 ENCOUNTER — Other Ambulatory Visit: Payer: Self-pay | Admitting: Physician Assistant

## 2020-07-25 DIAGNOSIS — F5101 Primary insomnia: Secondary | ICD-10-CM

## 2020-08-22 ENCOUNTER — Other Ambulatory Visit: Payer: Self-pay | Admitting: Physician Assistant

## 2020-08-22 DIAGNOSIS — F3170 Bipolar disorder, currently in remission, most recent episode unspecified: Secondary | ICD-10-CM

## 2020-09-01 ENCOUNTER — Other Ambulatory Visit: Payer: Self-pay | Admitting: Physician Assistant

## 2020-09-01 DIAGNOSIS — F5101 Primary insomnia: Secondary | ICD-10-CM

## 2020-09-02 NOTE — Telephone Encounter (Signed)
Requested medication (s) are due for refill today: no  Requested medication (s) are on the active medication list: yes  Last refill:  08/22/2020  Future visit scheduled: no  Notes to clinic:  this refill cannot be delegated     Requested Prescriptions  Pending Prescriptions Disp Refills   zolpidem (AMBIEN) 10 MG tablet [Pharmacy Med Name: ZOLPIDEM TARTRATE 10 MG TABLET] 90 tablet 1    Sig: TAKE 1 TABLET BY MOUTH EVERY DAY AT BEDTIME AS NEEDED FOR SLEEP      Not Delegated - Psychiatry:  Anxiolytics/Hypnotics Failed - 09/01/2020 10:27 AM      Failed - This refill cannot be delegated      Failed - Urine Drug Screen completed in last 360 days      Passed - Valid encounter within last 6 months    Recent Outpatient Visits           3 months ago Panic attacks   [place], [place]   4 months ago Acute bronchitis with COPD [facility])   [place], Vickki Muff, PA-C   4 months ago COPD exacerbation [facility])   [facility] [place], Dionne Bucy, MD   9 months ago Wellness examination   [facility] [place], [place]   10 months ago Bipolar disorder in full remission, most recent episode unspecified type [facility])   [facility], [place], [place]

## 2020-09-18 ENCOUNTER — Other Ambulatory Visit: Payer: Self-pay | Admitting: Physician Assistant

## 2020-09-18 DIAGNOSIS — I10 Essential (primary) hypertension: Secondary | ICD-10-CM

## 2020-10-02 ENCOUNTER — Other Ambulatory Visit: Payer: Self-pay | Admitting: Physician Assistant

## 2020-10-02 DIAGNOSIS — F419 Anxiety disorder, unspecified: Secondary | ICD-10-CM

## 2020-10-02 DIAGNOSIS — J302 Other seasonal allergic rhinitis: Secondary | ICD-10-CM

## 2020-10-02 DIAGNOSIS — F5101 Primary insomnia: Secondary | ICD-10-CM

## 2020-10-02 DIAGNOSIS — K5904 Chronic idiopathic constipation: Secondary | ICD-10-CM

## 2020-10-15 ENCOUNTER — Other Ambulatory Visit: Payer: Self-pay | Admitting: Physician Assistant

## 2020-10-15 DIAGNOSIS — F319 Bipolar disorder, unspecified: Secondary | ICD-10-CM

## 2020-10-16 ENCOUNTER — Telehealth: Payer: Self-pay | Admitting: Physician Assistant

## 2020-10-16 NOTE — Telephone Encounter (Signed)
Name:Stephanie Ball  Phone [phone]  Relationship:husband  Is calling to schedule AWV please advise

## 2020-10-16 NOTE — Telephone Encounter (Signed)
Spoke with husband and advised to call back the first part of July to schedule AWV with Tally Joe.

## 2020-10-28 ENCOUNTER — Other Ambulatory Visit: Payer: Self-pay | Admitting: Family Medicine

## 2020-10-28 ENCOUNTER — Other Ambulatory Visit: Payer: Self-pay | Admitting: Physician Assistant

## 2020-10-28 DIAGNOSIS — F419 Anxiety disorder, unspecified: Secondary | ICD-10-CM

## 2020-10-28 DIAGNOSIS — F5101 Primary insomnia: Secondary | ICD-10-CM

## 2020-10-28 DIAGNOSIS — F3170 Bipolar disorder, currently in remission, most recent episode unspecified: Secondary | ICD-10-CM

## 2020-11-22 NOTE — Progress Notes (Signed)
Established patient visit   Patient: Stephanie Ball   DOB: [birth year]   65 y.o. Female  MRN: [id number] Visit Date: 11/25/2020  Today's healthcare provider: Gwyneth Sprout, FNP   Chief Complaint  Patient presents with   Anxiety   Hypertension   Insomnia   Subjective    HPI  Anxiety, Follow-up  She was last seen for anxiety 6 months ago. Changes made at last visit include started patient on Hydroxyzine 25 mg TID PRN.   She reports excellent compliance with treatment. She reports excellent tolerance of treatment. She is not having side effects.   She feels her anxiety is mild and Improved since last visit.  Symptoms: No chest pain No difficulty concentrating  No dizziness No fatigue  No feelings of losing control Yes insomnia  No irritable No palpitations  No panic attacks No racing thoughts  No shortness of breath No sweating  No tremors/shakes    GAD-7 Results GAD-7 Generalized Anxiety Disorder Screening Tool 10/13/2019  1. Feeling Nervous, Anxious, or on Edge 0  2. Not Being Able to Stop or Control Worrying 0  3. Worrying Too Much About Different Things 2  4. Trouble Relaxing 3  5. Being So Restless it's Hard To Sit Still 0  6. Becoming Easily Annoyed or Irritable 1  7. Feeling Afraid As If Something Awful Might Happen 2  Total GAD-7 Score 8  Difficulty At Work, Home, or Getting  Along With Others? Not difficult at all    PHQ-9 Scores PHQ9 SCORE ONLY 05/17/2020 11/17/2019 10/06/2017  PHQ-9 Total Score '2 3 4    '$ ---------------------------------------------------------------------------------------------------  Hypertension, follow-up  BP Readings from Last 3 Encounters:  11/25/20 (!) 129/92  05/17/20 137/82  11/17/19 117/82   Wt Readings from Last 3 Encounters:  11/25/20 150 lb 9.6 oz (68.3 kg)  05/17/20 157 lb 9.6 oz (71.5 kg)  11/17/19 172 lb 9.6 oz (78.3 kg)     She was last seen for hypertension 2 years ago.  BP at that visit was 136/89.  Management since that visit includes none.  She reports excellent compliance with treatment. She is not having side effects.   She is following a Regular diet. She is not exercising. She does smoke.  Use of agents associated with hypertension: NSAIDS.   Outside blood pressures are not being checked. Symptoms: No chest pain No chest pressure  No palpitations No syncope  No dyspnea No orthopnea  No paroxysmal nocturnal dyspnea No lower extremity edema   Pertinent labs: Lab Results  Component Value Date   CHOL 189 11/17/2019   HDL 47 (L) 11/17/2019   LDLCALC 106 (H) 11/17/2019   TRIG 255 (H) 11/17/2019   CHOLHDL 4.0 11/17/2019   Lab Results  Component Value Date   NA 141 11/17/2019   K 3.7 11/17/2019   CREATININE 1.26 (H) 11/17/2019   GFRNONAA 34 (L) 02/16/2019   GFRAA 39 (L) 02/16/2019   GLUCOSE 105 (H) 11/17/2019     The 10-year ASCVD risk score Mikey Bussing DC Jr., et al., 2013) is: 12.2%   ---------------------------------------------------------------------------------------------------  Follow up for Insomnia  The patient was last seen for this 2 years ago. Changes made at last visit include none, condition stable on Ambien.  She reports excellent compliance with treatment. She feels that condition is Improved. She is not having side effects.   -----------------------------------------------------------------------------------------  Patient Active Problem List   Diagnosis Date Noted   Malaise and fatigue 11/24/2020   Panic attacks 11/24/2020  Bipolar disorder in full remission ([place]) 11/24/2020   Class 1 obesity due to excess calories with serious comorbidity and body mass index (BMI) of 30.0 to 30.9 in adult 11/21/2019   Encounter for screening mammogram for malignant neoplasm of breast 11/21/2019   Colon cancer screening 11/21/2019   Wellness examination 11/21/2019   Constipation 08/17/2019   Acute gouty arthritis 02/10/2016   Insomnia 12/07/2014   Anxiety  12/06/2014   Allergic rhinitis 12/06/2014   Atrophic kidney 12/06/2014   Abnormal LFTs 12/06/2014   Bloodgood disease 12/06/2014   History of hydronephrosis 12/06/2014   Blood glucose elevated 12/06/2014   Decreased potassium in the blood 12/06/2014   Arterial blood pressure decreased 12/06/2014   History of panic attacks 12/06/2014   Breath shortness 12/06/2014   Abnormal mammogram 12/06/2014   Chronic kidney disease (CKD), stage II (mild) 08/29/2012   History of metabolic disorder [id number]   Kidney cysts 08/29/2012   Calcium blood increased 07/19/2009   Avitaminosis D 02/07/2009   Hypercholesterolemia without hypertriglyceridemia 01/29/2009   Compulsive tobacco user syndrome 09/21/2008   Affective bipolar disorder ([place]) 08/24/2008   Clinical depression 08/24/2008   Endometriosis 08/24/2008   Blood in the urine 08/24/2008   Benign essential HTN 08/24/2008   Syncope and collapse 08/24/2008   Past Medical History:  Diagnosis Date   Bipolar disorder ([place]) 08/24/2008   Chronic kidney disease    Depressive disorder 08/24/2008   Disorder of kidney and ureter 07/19/2009   Fibrocystic breast disease    Hematuria 08/24/2008   Hydronephrosis of left kidney    Hypercalcemia 07/19/2009   Hyperglycemia    Hyperlipidemia 08/24/2008   Hypertension 08/24/2008   essential, benign   Hypokalemia    Panic attack    Pure hypercholesterolemia 09/28//2010   Family History  Problem Relation Age of Onset   Anuerysm Mother    Diabetes Father    Cancer Paternal Grandmother        skin   Healthy Sister    Healthy Daughter    Healthy Sister    Breast cancer Neg Hx    Allergies  Allergen Reactions   Aciphex  [Rabeprazole Sodium]    Amoxicillin-Pot Clavulanate    Iron    Macrobid  [Nitrofurantoin Monohyd Macro]    Metoprolol Other (See Comments)    After taking medication for about 72 hours she noted elevated systolic BP and swelling of hands and feet and discontinued.        Medications: Outpatient Medications Prior to Visit  Medication Sig   aspirin EC 81 MG tablet Take 81 mg by mouth daily.   busPIRone (BUSPAR) 30 MG tablet TAKE 1 TABLET BY MOUTH 2 TIMES DAILY.   clotrimazole-betamethasone (LOTRISONE) cream APPLY TOPICALLY 2 TIMES DAILY   famotidine (PEPCID) 20 MG tablet Take 20 mg by mouth 2 (two) times daily.   hydrochlorothiazide (HYDRODIURIL) 12.5 MG tablet TAKE 1 TABLET BY MOUTH EVERY DAY   hydrOXYzine (ATARAX/VISTARIL) 25 MG tablet Take 1 tablet (25 mg total) by mouth 3 (three) times daily as needed.   ibuprofen (ADVIL) 600 MG tablet Take 600 mg by mouth every 6 (six) hours as needed.   LINZESS 290 MCG CAPS capsule TAKE 1 CAPSULE (290 MCG TOTAL) BY MOUTH DAILY BEFORE BREAKFAST.   lisinopril (ZESTRIL) 40 MG tablet TAKE 1 TABLET BY MOUTH EVERY DAY   loratadine (CLARITIN) 10 MG tablet Take 10 mg by mouth daily.   lovastatin (MEVACOR) 40 MG tablet TAKE 1 TABLET BY MOUTH EVERYDAY AT BEDTIME  montelukast (SINGULAIR) 10 MG tablet TAKE 1 TABLET BY MOUTH EVERY DAY   OLANZapine (ZYPREXA) 15 MG tablet TAKE 1 TABLET BY MOUTH EVERY DAY   traZODone (DESYREL) 100 MG tablet TAKE 1 TABLET BY MOUTH EVERY DAY AT BEDTIME AS NEEDED FOR SLEEP   venlafaxine (EFFEXOR) 75 MG tablet TAKE 1 TABLET (75 MG TOTAL) BY MOUTH 3 (THREE) TIMES DAILY WITH MEALS.   Vitamin D, Ergocalciferol, (DRISDOL) 1.25 MG (50000 UNIT) CAPS capsule TAKE 1 CAPSULE (50,000 UNITS TOTAL) BY MOUTH EVERY 7 (SEVEN) DAYS.   zolpidem (AMBIEN) 10 MG tablet TAKE 1 TABLET BY MOUTH EVERY DAY AT BEDTIME AS NEEDED FOR SLEEP   albuterol (VENTOLIN HFA) 108 (90 Base) MCG/ACT inhaler TAKE 2 PUFFS BY MOUTH EVERY 6 HOURS AS NEEDED FOR WHEEZE OR SHORTNESS OF BREATH (Patient not taking: Reported on 11/25/2020)   colchicine 0.6 MG tablet Take 2 tabs PO at onset of symptoms, and one tab PO daily until symptoms resolve. Repeat as needed (Patient not taking: Reported on 11/25/2020)   [DISCONTINUED] azithromycin (ZITHROMAX) 250 MG  tablet Take '500mg'$  PO daily x1d and then '250mg'$  daily x4 days   No facility-administered medications prior to visit.    Review of Systems     Objective    BP (!) 129/92   Pulse (!) 105   Temp 98.3 F (36.8 C) (Oral)   Resp 16   Wt 150 lb 9.6 oz (68.3 kg)   SpO2 98%   BMI 27.55 kg/m  BP Readings from Last 3 Encounters:  11/25/20 124/80  05/17/20 137/82  11/17/19 117/82   Wt Readings from Last 3 Encounters:  11/25/20 150 lb 9.6 oz (68.3 kg)  05/17/20 157 lb 9.6 oz (71.5 kg)  11/17/19 172 lb 9.6 oz (78.3 kg)   SpO2 Readings from Last 3 Encounters:  11/25/20 98%  05/17/20 98%  07/12/19 96%       Physical Exam  Pt in no acute distress, well nourished, appears stated age.  Pt here for medication refill, plan for AWV next month.  No results found for any visits on 11/25/20.  Assessment & Plan     Problem List Items Addressed This Visit       Cardiovascular and Mediastinum   Benign essential HTN - Primary    BP in good range, watch bottom number Goal <90 Refil lisinopril 128/80 on recheck prior to leaving       Relevant Medications   lisinopril (ZESTRIL) 40 MG tablet     Genitourinary   Chronic kidney disease (CKD), stage II (mild)    ACE-i already ordered, no complaints in taking No complaints of dry cough         Other   Anxiety    Continue buspar, effexor, zyprexa  Buspar previously increased, no complaints Anxiety well controlled, stable Not needing to take hydrozyzine, can take up to 3 times/day. Pt aware       Relevant Medications   busPIRone (BUSPAR) 30 MG tablet   traZODone (DESYREL) 100 MG tablet   venlafaxine (EFFEXOR) 75 MG tablet   History of panic attacks    None noted Anxiety well controlled with daily meds No PRNs needed       Hypercholesterolemia without hypertriglyceridemia    Pt has done labs, done at [facility] Will bring for the wellness check       Relevant Medications   lisinopril (ZESTRIL) 40 MG tablet   Insomnia     No complaints Sleeps 8 hours a night, occ difficulty falling  asleep Wakes up throughtout night- 1x/night when awake May be up 30 mins        Relevant Medications   traZODone (DESYREL) 100 MG tablet   zolpidem (AMBIEN) 10 MG tablet   Panic attacks    Denies Anxiety well controlled Has PRNs if needed, hydroxyzine        Relevant Medications   busPIRone (BUSPAR) 30 MG tablet   traZODone (DESYREL) 100 MG tablet   venlafaxine (EFFEXOR) 75 MG tablet   Bipolar disorder in full remission ([place])    Well controlled with oral agents Stable Currently taking Effexor, Buspar and zyprexa- no complaints       Relevant Medications   venlafaxine (EFFEXOR) 75 MG tablet   Other Visit Diagnoses     Bipolar 1 disorder ([place])       Relevant Medications   OLANZapine (ZYPREXA) 15 MG tablet       Pt has an annual wellness visit scheduled next month, with spouse. Pt will bring in lab report at that time.  No follow-ups on file.     Candice Camp Rollene Rotunda, FNP, reviewed patient's history and performed today's examination including assessment and plan.      Gwyneth Sprout, [place] [phone] (phone) [phone] (fax)  [place]

## 2020-11-24 DIAGNOSIS — F410 Panic disorder [episodic paroxysmal anxiety] without agoraphobia: Secondary | ICD-10-CM | POA: Insufficient documentation

## 2020-11-24 DIAGNOSIS — R5381 Other malaise: Secondary | ICD-10-CM | POA: Insufficient documentation

## 2020-11-24 DIAGNOSIS — F3170 Bipolar disorder, currently in remission, most recent episode unspecified: Secondary | ICD-10-CM | POA: Insufficient documentation

## 2020-11-24 DIAGNOSIS — R5383 Other fatigue: Secondary | ICD-10-CM | POA: Insufficient documentation

## 2020-11-25 ENCOUNTER — Other Ambulatory Visit: Payer: Self-pay

## 2020-11-25 ENCOUNTER — Encounter: Payer: Self-pay | Admitting: Family Medicine

## 2020-11-25 ENCOUNTER — Ambulatory Visit: Payer: No Typology Code available for payment source | Admitting: Family Medicine

## 2020-11-25 VITALS — BP 124/80 | HR 105 | Temp 98.3°F | Resp 16 | Wt 150.6 lb

## 2020-11-25 DIAGNOSIS — F319 Bipolar disorder, unspecified: Secondary | ICD-10-CM

## 2020-11-25 DIAGNOSIS — N1832 Chronic kidney disease, stage 3b: Secondary | ICD-10-CM

## 2020-11-25 DIAGNOSIS — F5101 Primary insomnia: Secondary | ICD-10-CM

## 2020-11-25 DIAGNOSIS — Z0000 Encounter for general adult medical examination without abnormal findings: Secondary | ICD-10-CM

## 2020-11-25 DIAGNOSIS — F419 Anxiety disorder, unspecified: Secondary | ICD-10-CM

## 2020-11-25 DIAGNOSIS — G4700 Insomnia, unspecified: Secondary | ICD-10-CM

## 2020-11-25 DIAGNOSIS — F3170 Bipolar disorder, currently in remission, most recent episode unspecified: Secondary | ICD-10-CM

## 2020-11-25 DIAGNOSIS — Z8659 Personal history of other mental and behavioral disorders: Secondary | ICD-10-CM

## 2020-11-25 DIAGNOSIS — I10 Essential (primary) hypertension: Secondary | ICD-10-CM | POA: Diagnosis not present

## 2020-11-25 DIAGNOSIS — E782 Mixed hyperlipidemia: Secondary | ICD-10-CM | POA: Diagnosis not present

## 2020-11-25 DIAGNOSIS — F410 Panic disorder [episodic paroxysmal anxiety] without agoraphobia: Secondary | ICD-10-CM

## 2020-11-25 DIAGNOSIS — R5381 Other malaise: Secondary | ICD-10-CM

## 2020-11-25 MED ORDER — OLANZAPINE 15 MG PO TABS
15.0000 mg | ORAL_TABLET | Freq: Every day | ORAL | 1 refills | Status: DC
Start: 2020-11-25 — End: 2021-02-25

## 2020-11-25 MED ORDER — LISINOPRIL 40 MG PO TABS: 40.0000 mg | ORAL_TABLET | Freq: Every day | ORAL | 1 refills | Status: DC

## 2020-11-25 MED ORDER — BUSPIRONE HCL 30 MG PO TABS: 30.0000 mg | ORAL_TABLET | Freq: Two times a day (BID) | ORAL | 1 refills | Status: DC

## 2020-11-25 MED ORDER — ZOLPIDEM TARTRATE 10 MG PO TABS: ORAL_TABLET | ORAL | 0 refills | Status: DC

## 2020-11-25 MED ORDER — VENLAFAXINE HCL 75 MG PO TABS: 75.0000 mg | ORAL_TABLET | Freq: Three times a day (TID) | ORAL | 1 refills | Status: DC

## 2020-11-25 MED ORDER — TRAZODONE HCL 100 MG PO TABS: 100.0000 mg | ORAL_TABLET | Freq: Every evening | ORAL | 0 refills | Status: DC | PRN

## 2020-11-25 NOTE — Assessment & Plan Note (Signed)
Well controlled with oral agents Stable Currently taking Effexor, Buspar and zyprexa- no complaints

## 2020-11-25 NOTE — Assessment & Plan Note (Addendum)
BP in good range, watch bottom number Goal <90 Refil lisinopril 128/80 on recheck prior to leaving

## 2020-11-25 NOTE — Assessment & Plan Note (Signed)
No complaints Sleeps 8 hours a night, occ difficulty falling asleep Wakes up throughtout night- 1x/night when awake May be up 30 mins

## 2020-11-25 NOTE — Assessment & Plan Note (Signed)
Pt has done labs, done at [place] bring for the wellness check

## 2020-11-25 NOTE — Assessment & Plan Note (Addendum)
Continue buspar, effexor, zyprexa  Buspar previously increased, no complaints Anxiety well controlled, stable Not needing to take hydrozyzine, can take up to 3 times/day. Pt aware

## 2020-11-25 NOTE — Assessment & Plan Note (Addendum)
ACE-i already ordered, no complaints in taking No complaints of dry cough

## 2020-11-25 NOTE — Assessment & Plan Note (Signed)
Denies Anxiety well controlled Has PRNs if needed, hydroxyzine

## 2020-11-25 NOTE — Assessment & Plan Note (Signed)
None noted Anxiety well controlled with daily meds No PRNs needed

## 2020-11-27 ENCOUNTER — Encounter: Payer: Self-pay | Admitting: Family Medicine

## 2020-12-09 ENCOUNTER — Other Ambulatory Visit: Payer: Self-pay | Admitting: Physician Assistant

## 2020-12-09 DIAGNOSIS — J302 Other seasonal allergic rhinitis: Secondary | ICD-10-CM

## 2020-12-18 ENCOUNTER — Encounter: Payer: Self-pay | Admitting: Family Medicine

## 2020-12-18 ENCOUNTER — Ambulatory Visit (INDEPENDENT_AMBULATORY_CARE_PROVIDER_SITE_OTHER): Payer: No Typology Code available for payment source | Admitting: Family Medicine

## 2020-12-18 ENCOUNTER — Other Ambulatory Visit: Payer: Self-pay

## 2020-12-18 VITALS — BP 132/96 | HR 97 | Temp 98.1°F | Resp 16 | Ht 62.0 in | Wt 149.1 lb

## 2020-12-18 DIAGNOSIS — N1832 Chronic kidney disease, stage 3b: Secondary | ICD-10-CM | POA: Insufficient documentation

## 2020-12-18 DIAGNOSIS — E7800 Pure hypercholesterolemia, unspecified: Secondary | ICD-10-CM

## 2020-12-18 DIAGNOSIS — F3170 Bipolar disorder, currently in remission, most recent episode unspecified: Secondary | ICD-10-CM

## 2020-12-18 DIAGNOSIS — I10 Essential (primary) hypertension: Secondary | ICD-10-CM | POA: Diagnosis not present

## 2020-12-18 DIAGNOSIS — Z0000 Encounter for general adult medical examination without abnormal findings: Secondary | ICD-10-CM

## 2020-12-18 DIAGNOSIS — Z1231 Encounter for screening mammogram for malignant neoplasm of breast: Secondary | ICD-10-CM

## 2020-12-18 DIAGNOSIS — F17200 Nicotine dependence, unspecified, uncomplicated: Secondary | ICD-10-CM

## 2020-12-18 DIAGNOSIS — E559 Vitamin D deficiency, unspecified: Secondary | ICD-10-CM

## 2020-12-18 NOTE — Assessment & Plan Note (Signed)
Denies fatigue or other symptoms Labs pending

## 2020-12-18 NOTE — Assessment & Plan Note (Signed)
Denies concerns Lab work pending

## 2020-12-18 NOTE — Progress Notes (Signed)
Complete physical exam   Patient: Stephanie Ball   DOB: Sep 02, 1955   65 y.o. Female  MRN: [id number] Visit Date: 12/18/2020  Today's healthcare provider: Gwyneth Sprout, FNP   Chief Complaint  Patient presents with   Annual Exam   Subjective     HPI  Stephanie Ball is a 65 y.o. female who presents today for a complete physical exam.  She reports consuming a general diet. The patient does not participate in regular exercise at present. She generally feels well. She reports sleeping well. She does not have additional problems to discuss today.   Past Medical History:  Diagnosis Date   Bipolar disorder ([place]) 08/24/2008   Chronic kidney disease    Depressive disorder 08/24/2008   Disorder of kidney and ureter 07/19/2009   Fibrocystic breast disease    Hematuria 08/24/2008   Hydronephrosis of left kidney    Hypercalcemia 07/19/2009   Hyperglycemia    Hyperlipidemia 08/24/2008   Hypertension 08/24/2008   essential, benign   Hypokalemia    Panic attack    Pure hypercholesterolemia 09/28//2010   Past Surgical History:  Procedure Laterality Date   ABDOMINAL HYSTERECTOMY     abdominal:due to fibroid and endometriosis. No history of abnormal paps. Ovaries removed also.   BREAST BIOPSY Right 2012   FIBROEPITHEIAL LESION WITH FLORID DUCTAL   CHOLECYSTECTOMY     no further information given   KIDNEY SURGERY     as stated pt had kidney surgery with no further given   OOPHORECTOMY Bilateral 1996   Dr, DeFrancisco   TUBAL LIGATION     Social History   Socioeconomic History   Marital status: Married    Spouse name: Not on file   Number of children: Not on file   Years of education: Not on file   Highest education level: Not on file  Occupational History   Not on file  Tobacco Use   Smoking status: Every Day    Packs/day: 0.50    Types: Cigarettes   Smokeless tobacco: Never   Tobacco comments:    would like to quit; as stated pt smoked 1/2 PPd for 25 years  and now is smoking 5-6 cigarettes per day.  Vaping Use   Vaping Use: Never used  Substance and Sexual Activity   Alcohol use: No   Drug use: No   Sexual activity: Not on file  Other Topics Concern   Not on file  Social History Narrative   Not on file   Social Determinants of Health   Financial Resource Strain: Not on file  Food Insecurity: Not on file  Transportation Needs: Not on file  Physical Activity: Not on file  Stress: Not on file  Social Connections: Not on file  Intimate Partner Violence: Not on file   Family Status  Relation Name Status   Mother  Deceased       Cerebral aneurysm   Father  Deceased   MGM  Deceased       brain Aneurysm   PGM  Deceased   Sister  Alive   Daughter  Alive   Sister  Alive   Neg Hx  (Not Specified)   Family History  Problem Relation Age of Onset   Anuerysm Mother    Diabetes Father    Cancer Paternal Grandmother        skin   Healthy Sister    Healthy Daughter    Healthy Sister    Breast cancer Neg Hx  Allergies  Allergen Reactions   Aciphex  [Rabeprazole Sodium]    Amoxicillin-Pot Clavulanate    Iron    Macrobid  [Nitrofurantoin Monohyd Macro]    Metoprolol Other (See Comments)    After taking medication for about 72 hours she noted elevated systolic BP and swelling of hands and feet and discontinued.    Patient Care Team: Gwyneth Sprout, FNP as PCP - General (Family Medicine)   Medications: Outpatient Medications Prior to Visit  Medication Sig   aspirin EC 81 MG tablet Take 81 mg by mouth daily.   busPIRone (BUSPAR) 30 MG tablet Take 1 tablet (30 mg total) by mouth 2 (two) times daily.   clotrimazole-betamethasone (LOTRISONE) cream APPLY TOPICALLY 2 TIMES DAILY   famotidine (PEPCID) 20 MG tablet Take 20 mg by mouth 2 (two) times daily.   hydrochlorothiazide (HYDRODIURIL) 12.5 MG tablet TAKE 1 TABLET BY MOUTH EVERY DAY   hydrOXYzine (ATARAX/VISTARIL) 25 MG tablet Take 1 tablet (25 mg total) by mouth 3 (three)  times daily as needed.   ibuprofen (ADVIL) 600 MG tablet Take 600 mg by mouth every 6 (six) hours as needed.   LINZESS 290 MCG CAPS capsule TAKE 1 CAPSULE (290 MCG TOTAL) BY MOUTH DAILY BEFORE BREAKFAST.   lisinopril (ZESTRIL) 40 MG tablet Take 1 tablet (40 mg total) by mouth daily.   loratadine (CLARITIN) 10 MG tablet Take 10 mg by mouth daily.   lovastatin (MEVACOR) 40 MG tablet TAKE 1 TABLET BY MOUTH EVERYDAY AT BEDTIME   montelukast (SINGULAIR) 10 MG tablet TAKE 1 TABLET BY MOUTH EVERY DAY   OLANZapine (ZYPREXA) 15 MG tablet Take 1 tablet (15 mg total) by mouth daily.   traZODone (DESYREL) 100 MG tablet Take 1 tablet (100 mg total) by mouth at bedtime as needed for up to 90 doses for sleep.   venlafaxine (EFFEXOR) 75 MG tablet Take 1 tablet (75 mg total) by mouth 3 (three) times daily with meals.   Vitamin D, Ergocalciferol, (DRISDOL) 1.25 MG (50000 UNIT) CAPS capsule TAKE 1 CAPSULE (50,000 UNITS TOTAL) BY MOUTH EVERY 7 (SEVEN) DAYS.   zolpidem (AMBIEN) 10 MG tablet TAKE 1 TABLET BY MOUTH EVERY DAY AT BEDTIME AS NEEDED FOR SLEEP   albuterol (VENTOLIN HFA) 108 (90 Base) MCG/ACT inhaler TAKE 2 PUFFS BY MOUTH EVERY 6 HOURS AS NEEDED FOR WHEEZE OR SHORTNESS OF BREATH (Patient not taking: No sig reported)   colchicine 0.6 MG tablet Take 2 tabs PO at onset of symptoms, and one tab PO daily until symptoms resolve. Repeat as needed (Patient not taking: No sig reported)   No facility-administered medications prior to visit.    Review of Systems  All other systems reviewed and are negative.    Objective    BP (!) 132/96   Pulse 97   Temp 98.1 F (36.7 C) (Oral)   Resp 16   Ht '5\' 2"'$  (1.575 m)   Wt 149 lb 1.6 oz (67.6 kg)   SpO2 100%   BMI 27.27 kg/m    Physical Exam Vitals and nursing note reviewed.  Constitutional:      General: She is awake. She is not in acute distress.    Appearance: Normal appearance. She is well-developed, well-groomed and overweight. She is not ill-appearing,  toxic-appearing or diaphoretic.  HENT:     Head: Normocephalic and atraumatic.     Jaw: There is normal jaw occlusion. No trismus, tenderness, swelling, pain on movement or malocclusion.     Salivary Glands: Right salivary gland  is not diffusely enlarged or tender. Left salivary gland is not diffusely enlarged or tender.     Right Ear: Hearing, tympanic membrane, ear canal and external ear normal.     Left Ear: Hearing, tympanic membrane, ear canal and external ear normal.     Nose: Nose normal. No congestion or rhinorrhea.     Right Turbinates: Not enlarged, swollen or pale.     Left Turbinates: Not enlarged, swollen or pale.     Right Sinus: No maxillary sinus tenderness or frontal sinus tenderness.     Left Sinus: No maxillary sinus tenderness or frontal sinus tenderness.     Mouth/Throat:     Lips: Pink.     Mouth: Mucous membranes are moist.     Dentition: Has dentures.     Tongue: No lesions.     Palate: No mass.     Pharynx: Oropharynx is clear. Uvula midline. No oropharyngeal exudate or posterior oropharyngeal erythema.     Tonsils: No tonsillar exudate.     Comments: In the process of getting dentures- has temp denture currently on top.  Will do bottom later Eyes:     General: Lids are normal.        Right eye: No discharge.        Left eye: No discharge.     Extraocular Movements: Extraocular movements intact.     Conjunctiva/sclera: Conjunctivae normal.     Pupils: Pupils are equal, round, and reactive to light.     Comments: In need of eye exam; pt encouraged to call and schedule.  They noted that they will need a new provider  Neck:     Thyroid: No thyroid mass, thyromegaly or thyroid tenderness.     Vascular: No carotid bruit or JVD.     Trachea: Trachea normal.  Cardiovascular:     Rate and Rhythm: Normal rate and regular rhythm.     Pulses:          Radial pulses are 2+ on the right side and 2+ on the left side.       Dorsalis pedis pulses are 2+ on the right side  and 2+ on the left side.     Heart sounds: Normal heart sounds, S1 normal and S2 normal. No murmur heard.   No friction rub. No gallop.  Pulmonary:     Effort: Pulmonary effort is normal. No respiratory distress.     Breath sounds: Normal breath sounds and air entry. No stridor. No wheezing, rhonchi or rales.     Comments: Chronic smoker Encouraged cutting back to goal of quitting Pt aware of risks vs benefits; refused assistance at this time Chest:     Chest wall: No tenderness.     Comments: Exam deferred; plan for breast exam in 6 months with PAP Denies complaints Abdominal:     General: Abdomen is flat. Bowel sounds are normal. There is no distension.     Palpations: Abdomen is soft. There is no mass.     Tenderness: There is no abdominal tenderness. There is no guarding or rebound.     Hernia: No hernia is present.  Genitourinary:    Comments: Exam deferred; needs PAP- will schedule at 6 month f/u Musculoskeletal:     Cervical back: Full passive range of motion without pain, normal range of motion and neck supple. No rigidity or tenderness.     Right lower leg: No edema.     Left lower leg: No edema.  Lymphadenopathy:  Cervical: No cervical adenopathy.     Right cervical: No superficial, deep or posterior cervical adenopathy.    Left cervical: No superficial, deep or posterior cervical adenopathy.  Skin:    General: Skin is warm and dry.     Capillary Refill: Capillary refill takes less than 2 seconds.  Neurological:     General: No focal deficit present.     Mental Status: She is alert and oriented to person, place, and time. Mental status is at baseline.     Cranial Nerves: No cranial nerve deficit.     Sensory: Sensation is intact. No sensory deficit.     Motor: Motor function is intact. No weakness.     Coordination: Coordination is intact. Coordination normal.     Gait: Gait is intact. Gait normal.  Psychiatric:        Attention and Perception: Attention normal.         Mood and Affect: Mood and affect normal.        Speech: Speech normal.        Behavior: Behavior normal. Behavior is cooperative.        Thought Content: Thought content normal.        Cognition and Memory: Cognition normal.        Judgment: Judgment normal.     Last depression screening scores PHQ 2/9 Scores 12/18/2020 05/17/2020 11/17/2019  PHQ - 2 Score 0 0 1  PHQ- 9 Score '2 2 3  '$ Exception Documentation - - -   Last fall risk screening Fall Risk  12/18/2020  Falls in the past year? 0  Number falls in past yr: 0  Injury with Fall? 0  Risk for fall due to : -  Follow up -   Last Audit-C alcohol use screening Alcohol Use Disorder Test (AUDIT) 12/18/2020  1. How often do you have a drink containing alcohol? 0  2. How many drinks containing alcohol do you have on a typical day when you are drinking? 0  3. How often do you have six or more drinks on one occasion? 0  AUDIT-C Score 0  Alcohol Brief Interventions/Follow-up -   A score of 3 or more in women, and 4 or more in men indicates increased risk for alcohol abuse, EXCEPT if all of the points are from question 1   No results found for any visits on 12/18/20.  Assessment & Plan  Problem List Items Addressed This Visit       Cardiovascular and Mediastinum   Primary hypertension    Continue HCTZ and ACEi Remains borderline Continue to trend- possible increase/additional of another agent        Genitourinary   Stage 3b chronic kidney disease ([place])    Denies concerns Lab work pending        Other   Hypercholesterolemia without hypertriglyceridemia    recommend diet low in saturated fat and regular exercise - 30 min at least 5 times per week  Continue medication; denied complications      Compulsive tobacco user syndrome    Continue to reinforce tobacco reduction with eventual cessation Refused assistance at this time      Avitaminosis D    Denies fatigue or other symptoms Labs pending      Encounter  for screening mammogram for malignant neoplasm of breast    Referral placed; pt encouraged to call for an appt if she has not heard from the facility in 2 wks      Wellness examination -  Primary    Pt and spouse together for appt today PAP and breast exam deferred for privacy concerns Discussed need for mammo and vaccinations; referral placed. Pt will look into vaccinations at local pharmacy. No acute concerns      Relevant Orders   CBC with Differential/Platelet   Comprehensive metabolic panel   Hemoglobin A1c   Lipid panel   Magnesium   TSH   Vitamin B12   VITAMIN D 25 Hydroxy (Vit-D Deficiency, Fractures)   Bipolar disorder in full remission ([place])    Well managed at this time Continue medications          Routine Health Maintenance and Physical Exam  Exercise Activities and Dietary recommendations  Goals   None     Immunization History  Administered Date(s) Administered   Influenza Split 02/13/2009, 01/22/2010, 01/14/2011   Influenza,inj,Quad PF,6+ Mos 01/20/2013, 03/09/2014, 01/05/2017, 01/27/2019   Influenza-Unspecified 03/01/2015, 02/15/2018   PFIZER(Purple Top)SARS-COV-2 Vaccination 07/28/2019, 08/23/2019   Pneumococcal Conjugate-13 01/27/2019   Td 12/02/2016   Tdap 09/03/2005  Plan for vaccines at local pharmacy at later date- PNA #2, Shingles, COVID booster, Flu  Health Maintenance  Topic Date Due   Zoster Vaccines- Shingrix (1 of 2) Never done   PAP SMEAR-Modifier  01/06/2020   COVID-19 Vaccine (3 - Booster for Pfizer series) 01/23/2020   Pneumococcal Vaccine 40-43 Years old (2 - PPSV23 or PCV20) 01/27/2020   MAMMOGRAM  02/14/2020   INFLUENZA VACCINE  12/02/2020   Fecal DNA (Cologuard)  11/22/2022   TETANUS/TDAP  12/03/2026   Hepatitis C Screening  Completed   HIV Screening  Completed   HPV VACCINES  Aged Out   COLON CANCER SCREENING ANNUAL FOBT  Discontinued    Discussed health benefits of physical activity, and encouraged her to engage in  regular exercise appropriate for her age and condition.    Return in about 6 months (around 06/20/2021) for anxiety and depression, HTN management.     Vonna Kotyk, FNP, have reviewed all documentation for this visit. The documentation on 12/18/20 for the exam, diagnosis, procedures, and orders are all accurate and complete.    Gwyneth Sprout, [place] [phone] (phone) [phone] (fax)  [place]

## 2020-12-18 NOTE — Assessment & Plan Note (Signed)
Continue to reinforce tobacco reduction with eventual cessation Refused assistance at this time

## 2020-12-18 NOTE — Assessment & Plan Note (Signed)
recommend diet low in saturated fat and regular exercise - 30 min at least 5 times per week  Continue medication; denied complications

## 2020-12-18 NOTE — Assessment & Plan Note (Signed)
Pt and spouse together for appt today PAP and breast exam deferred for privacy concerns Discussed need for mammo and vaccinations; referral placed. Pt will look into vaccinations at local pharmacy. No acute concerns

## 2020-12-18 NOTE — Assessment & Plan Note (Signed)
Well managed at this time Continue medications

## 2020-12-18 NOTE — Assessment & Plan Note (Signed)
Continue HCTZ and ACEi Remains borderline Continue to trend- possible increase/additional of another agent

## 2020-12-18 NOTE — Assessment & Plan Note (Signed)
Referral placed; pt encouraged to call for an appt if she has not heard from the facility in 2 wks

## 2020-12-25 ENCOUNTER — Other Ambulatory Visit: Payer: Self-pay | Admitting: Family Medicine

## 2020-12-25 DIAGNOSIS — E7800 Pure hypercholesterolemia, unspecified: Secondary | ICD-10-CM

## 2020-12-25 LAB — VITAMIN D 25 HYDROXY (VIT D DEFICIENCY, FRACTURES): Vit D, 25-Hydroxy: 129 ng/mL — ABNORMAL HIGH (ref 30–100)

## 2020-12-25 LAB — CBC WITH DIFFERENTIAL/PLATELET
Absolute Monocytes: 443 cells/uL (ref 200–950)
Basophils Absolute: 8 cells/uL (ref 0–200)
Basophils Relative: 0.1 %
Eosinophils Absolute: 0 cells/uL — ABNORMAL LOW (ref 15–500)
Eosinophils Relative: 0 %
HCT: 42.2 % (ref 35.0–45.0)
Hemoglobin: 14 g/dL (ref 11.7–15.5)
Lymphs Abs: 2123 cells/uL (ref 850–3900)
MCH: 28.9 pg (ref 27.0–33.0)
MCHC: 33.2 g/dL (ref 32.0–36.0)
MCV: 87.2 fL (ref 80.0–100.0)
MPV: 9.1 fL (ref 7.5–12.5)
Monocytes Relative: 5.9 %
Neutro Abs: 4928 cells/uL (ref 1500–7800)
Neutrophils Relative %: 65.7 %
Platelets: 311 10*3/uL (ref 140–400)
RBC: 4.84 10*6/uL (ref 3.80–5.10)
RDW: 13.7 % (ref 11.0–15.0)
Total Lymphocyte: 28.3 %
WBC: 7.5 10*3/uL (ref 3.8–10.8)

## 2020-12-25 LAB — LIPID PANEL
Cholesterol: 178 mg/dL (ref <200)
HDL: 39 mg/dL — ABNORMAL LOW (ref >=50)
LDL Cholesterol (Calc): 98 mg/dL (calc)
Non-HDL Cholesterol (Calc): 139 mg/dL (calc) — ABNORMAL HIGH (ref <130)
Total CHOL/HDL Ratio: 4.6 (calc) (ref <5.0)
Triglycerides: 308 mg/dL — ABNORMAL HIGH (ref <150)

## 2020-12-25 LAB — COMPREHENSIVE METABOLIC PANEL
AG Ratio: 1.4 (calc) (ref 1.0–2.5)
ALT: 8 U/L (ref 6–29)
AST: 14 U/L (ref 10–35)
Albumin: 4.1 g/dL (ref 3.6–5.1)
Alkaline phosphatase (APISO): 86 U/L (ref 37–153)
BUN/Creatinine Ratio: 7 (calc) (ref 6–22)
BUN: 11 mg/dL (ref 7–25)
CO2: 27 mmol/L (ref 20–32)
Calcium: 11.2 mg/dL — ABNORMAL HIGH (ref 8.6–10.4)
Chloride: 104 mmol/L (ref 98–110)
Creat: 1.57 mg/dL — ABNORMAL HIGH (ref 0.50–1.05)
Globulin: 3 g/dL (calc) (ref 1.9–3.7)
Glucose, Bld: 92 mg/dL (ref 65–99)
Potassium: 3.1 mmol/L — ABNORMAL LOW (ref 3.5–5.3)
Sodium: 140 mmol/L (ref 135–146)
Total Bilirubin: 0.6 mg/dL (ref 0.2–1.2)
Total Protein: 7.1 g/dL (ref 6.1–8.1)

## 2020-12-25 LAB — MAGNESIUM: Magnesium: 1.8 mg/dL (ref 1.5–2.5)

## 2020-12-25 LAB — HEMOGLOBIN A1C
Hgb A1c MFr Bld: 5 % of total Hgb (ref <5.7)
Mean Plasma Glucose: 97 mg/dL
eAG (mmol/L): 5.4 mmol/L

## 2020-12-25 LAB — TSH: TSH: 2.75 mIU/L (ref 0.40–4.50)

## 2020-12-25 LAB — VITAMIN B12: Vitamin B-12: 148 pg/mL — ABNORMAL LOW (ref 200–1100)

## 2020-12-25 MED ORDER — ROSUVASTATIN CALCIUM 20 MG PO TABS: 20.0000 mg | ORAL_TABLET | Freq: Every day | ORAL | 3 refills | Status: DC

## 2021-01-06 ENCOUNTER — Other Ambulatory Visit: Payer: Self-pay | Admitting: Family Medicine

## 2021-01-06 ENCOUNTER — Other Ambulatory Visit: Payer: Self-pay | Admitting: Physician Assistant

## 2021-01-06 DIAGNOSIS — F5101 Primary insomnia: Secondary | ICD-10-CM

## 2021-01-06 DIAGNOSIS — I10 Essential (primary) hypertension: Secondary | ICD-10-CM

## 2021-01-06 DIAGNOSIS — E7800 Pure hypercholesterolemia, unspecified: Secondary | ICD-10-CM

## 2021-01-08 ENCOUNTER — Other Ambulatory Visit: Payer: Self-pay | Admitting: Family Medicine

## 2021-01-08 DIAGNOSIS — I10 Essential (primary) hypertension: Secondary | ICD-10-CM

## 2021-01-08 MED ORDER — HYDROCHLOROTHIAZIDE 12.5 MG PO TABS: 12.5000 mg | ORAL_TABLET | Freq: Every day | ORAL | 3 refills | Status: DC

## 2021-01-24 ENCOUNTER — Telehealth: Payer: Self-pay | Admitting: Family Medicine

## 2021-01-24 NOTE — Telephone Encounter (Signed)
Pt's spouse called in for assist, pt and spouse has a billing concern  for DOS 12/18/20. spouse says that he was told by [facility] that pt's visit wasn't billed/filed to their insurance. Spouse says that he received a bill. Pt says that his insurance is giving him a week to have the billing filed or pt will be responsible for payment.    Please assist further

## 2021-02-12 ENCOUNTER — Other Ambulatory Visit: Payer: Self-pay | Admitting: Family Medicine

## 2021-02-12 DIAGNOSIS — F5101 Primary insomnia: Secondary | ICD-10-CM

## 2021-02-14 MED ORDER — ZOLPIDEM TARTRATE 10 MG PO TABS: ORAL_TABLET | ORAL | 0 refills | Status: DC

## 2021-02-24 ENCOUNTER — Ambulatory Visit: Payer: Self-pay | Admitting: *Deleted

## 2021-02-24 NOTE — Telephone Encounter (Signed)
Patient is calling to report she has been having anxiety attacks- patient states she can not reason a cause- she states she has had 5 this weekend. She just gets panic- trouble catching breath, dizziness, nausea. Patient is requesting appointment- no appointment available- message sent to provider- patient needs appointment for evaluation.  Reason for Disposition  Symptoms interfere with work or school  Answer Assessment - Initial Assessment Questions 1. CONCERN: "Did anything happen that prompted you to call today?"      Nothing that she knows- they come out of nowhere 2. ANXIETY SYMPTOMS: "Can you describe how you (your loved one; patient) have been feeling?" (e.g., tense, restless, panicky, anxious, keyed up, overwhelmed, sense of impending doom).      Trouble breathing, dizziness, nausea 3. ONSET: "How long have you been feeling this way?" (e.g., hours, days, weeks)     5 times this week end 4. SEVERITY: "How would you rate the level of anxiety?" (e.g., 0 - 10; or mild, moderate, severe).     moderate 5. FUNCTIONAL IMPAIRMENT: "How have these feelings affected your ability to do daily activities?" "Have you had more difficulty than usual doing your normal daily activities?" (e.g., getting better, same, worse; self-care, school, work, interactions)     Interferes with normal activity 6. HISTORY: "Have you felt this way before?" "Have you ever been diagnosed with an anxiety problem in the past?" (e.g., generalized anxiety disorder, panic attacks, PTSD). If Yes, ask: "How was this problem treated?" (e.g., medicines, counseling, etc.)     Yes- not on medication 7. RISK OF HARM - SUICIDAL IDEATION: "Do you ever have thoughts of hurting or killing yourself?" If Yes, ask:  "Do you have these feelings now?" "Do you have a plan on how you would do this?"     no 8. TREATMENT:  "What has been done so far to treat this anxiety?" (e.g., medicines, relaxation strategies). "What has helped?"     Sitting,  breathing 9. TREATMENT - THERAPIST: "Do you have a counselor or therapist? Name?"     no 10. POTENTIAL TRIGGERS: "Do you drink caffeinated beverages (e.g., coffee, colas, teas), and how much daily?" "Do you drink alcohol or use any drugs?" "Have you started any new medicines recently?"     Caffeine -tes 10. PATIENT SUPPORT: "Who is with you now?" "Who do you live with?" "Do you have family or friends who you can talk to?"        Patient lived with husband- support, family also 76. OTHER SYMPTOMS: "Do you have any other symptoms?" (e.g., feeling depressed, trouble concentrating, trouble sleeping, trouble breathing, palpitations or fast heartbeat, chest pain, sweating, nausea, or diarrhea)      no 12. PREGNANCY: "Is there any chance you are pregnant?" "When was your last menstrual period?"       N/a  Protocols used: Anxiety and Panic Attack-A-AH

## 2021-02-24 NOTE — Telephone Encounter (Signed)
Contacted patient and scheduled appt for tomorrow at 11AM.KW

## 2021-02-24 NOTE — Telephone Encounter (Signed)
Spoke with patient on the phone she states that symptoms of anxiety are recurrent and states that two weekends ago is when she started having frequent panic attacks. Patient complains of stress, anxiety, insomnia, chest pain and dyspnea associated with anxiety. I advised patient that all providers schedules are booked for the week and I would send message back to PCP to see if she can be fit in possibly within the next 24-48hrs. Patient request morning appt only, please review your schedle and let me know what is a good time to place patient. KW

## 2021-02-25 ENCOUNTER — Other Ambulatory Visit: Payer: Self-pay

## 2021-02-25 ENCOUNTER — Encounter: Payer: Self-pay | Admitting: Family Medicine

## 2021-02-25 ENCOUNTER — Ambulatory Visit: Payer: No Typology Code available for payment source | Admitting: Family Medicine

## 2021-02-25 VITALS — BP 131/84 | HR 102 | Temp 97.3°F | Resp 16 | Wt 139.9 lb

## 2021-02-25 DIAGNOSIS — F319 Bipolar disorder, unspecified: Secondary | ICD-10-CM

## 2021-02-25 DIAGNOSIS — F419 Anxiety disorder, unspecified: Secondary | ICD-10-CM

## 2021-02-25 DIAGNOSIS — F410 Panic disorder [episodic paroxysmal anxiety] without agoraphobia: Secondary | ICD-10-CM

## 2021-02-25 DIAGNOSIS — F5101 Primary insomnia: Secondary | ICD-10-CM

## 2021-02-25 MED ORDER — TRAZODONE HCL 100 MG PO TABS: 200.0000 mg | ORAL_TABLET | Freq: Every evening | ORAL | 3 refills | Status: DC | PRN

## 2021-02-25 MED ORDER — BUSPIRONE HCL 30 MG PO TABS: 30.0000 mg | ORAL_TABLET | Freq: Two times a day (BID) | ORAL | 3 refills | Status: DC

## 2021-02-25 MED ORDER — PROPRANOLOL HCL 40 MG PO TABS: 40.0000 mg | ORAL_TABLET | Freq: Two times a day (BID) | ORAL | 0 refills | Status: DC

## 2021-02-25 MED ORDER — HYDROXYZINE HCL 25 MG PO TABS: 25.0000 mg | ORAL_TABLET | Freq: Three times a day (TID) | ORAL | 3 refills | Status: DC | PRN

## 2021-02-25 MED ORDER — OLANZAPINE 15 MG PO TABS: 15.0000 mg | ORAL_TABLET | Freq: Every day | ORAL | 3 refills | Status: DC

## 2021-02-25 NOTE — Assessment & Plan Note (Signed)
Increase trazodone; can double dose qHS or add second tab once awake Report back

## 2021-02-25 NOTE — Assessment & Plan Note (Signed)
Start BB; 2 weeks of qday and then BID

## 2021-02-25 NOTE — Progress Notes (Signed)
Established patient visit   Patient: Stephanie Ball   DOB: 06-Jul-1955   65 y.o. Female  MRN: [id number] Visit Date: 02/25/2021  Today's healthcare provider: Gwyneth Sprout, FNP   Chief Complaint  Patient presents with   Anxiety   Subjective    HPI  Anxiety, Follow-up  She was last seen for anxiety 2 months ago. Changes made at last visit include none.   She reports excellent compliance with treatment. She reports good tolerance of treatment. She is not having side effects.   She feels her anxiety is severe and Worse since last visit.  Symptoms: Yes chest pain Yes difficulty concentrating  Yes dizziness Yes fatigue  No feelings of losing control No insomnia  No irritable No palpitations  Yes panic attacks No racing thoughts  Yes shortness of breath No sweating  No tremors/shakes    GAD-7 Results GAD-7 Generalized Anxiety Disorder Screening Tool 02/25/2021 10/13/2019  1. Feeling Nervous, Anxious, or on Edge 0 0  2. Not Being Able to Stop or Control Worrying 0 0  3. Worrying Too Much About Different Things 0 2  4. Trouble Relaxing 0 3  5. Being So Restless it's Hard To Sit Still 0 0  6. Becoming Easily Annoyed or Irritable 0 1  7. Feeling Afraid As If Something Awful Might Happen 0 2  Total GAD-7 Score 0 8  Difficulty At Work, Home, or Getting  Along With Others? Not difficult at all Not difficult at all    PHQ-9 Scores PHQ9 SCORE ONLY 02/25/2021 12/18/2020 05/17/2020  PHQ-9 Total Score 3 2 2     ---------------------------------------------------------------------------------------------------   Medications: Outpatient Medications Prior to Visit  Medication Sig   albuterol (VENTOLIN HFA) 108 (90 Base) MCG/ACT inhaler TAKE 2 PUFFS BY MOUTH EVERY 6 HOURS AS NEEDED FOR WHEEZE OR SHORTNESS OF BREATH   aspirin EC 81 MG tablet Take 81 mg by mouth daily.   clotrimazole-betamethasone (LOTRISONE) cream APPLY TOPICALLY 2 TIMES DAILY   colchicine 0.6 MG tablet  Take 2 tabs PO at onset of symptoms, and one tab PO daily until symptoms resolve. Repeat as needed   famotidine (PEPCID) 20 MG tablet Take 20 mg by mouth 2 (two) times daily.   hydrochlorothiazide (HYDRODIURIL) 12.5 MG tablet Take 1 tablet (12.5 mg total) by mouth daily.   ibuprofen (ADVIL) 600 MG tablet Take 600 mg by mouth every 6 (six) hours as needed.   LINZESS 290 MCG CAPS capsule TAKE 1 CAPSULE (290 MCG TOTAL) BY MOUTH DAILY BEFORE BREAKFAST.   lisinopril (ZESTRIL) 40 MG tablet Take 1 tablet (40 mg total) by mouth daily.   loratadine (CLARITIN) 10 MG tablet Take 10 mg by mouth daily.   montelukast (SINGULAIR) 10 MG tablet TAKE 1 TABLET BY MOUTH EVERY DAY   rosuvastatin (CRESTOR) 20 MG tablet Take 1 tablet (20 mg total) by mouth daily.   venlafaxine (EFFEXOR) 75 MG tablet Take 1 tablet (75 mg total) by mouth 3 (three) times daily with meals.   zolpidem (AMBIEN) 10 MG tablet TAKE 1 TABLET BY MOUTH EVERY DAY AT BEDTIME AS NEEDED FOR SLEEP   [DISCONTINUED] busPIRone (BUSPAR) 30 MG tablet Take 1 tablet (30 mg total) by mouth 2 (two) times daily.   [DISCONTINUED] hydrOXYzine (ATARAX/VISTARIL) 25 MG tablet Take 1 tablet (25 mg total) by mouth 3 (three) times daily as needed.   [DISCONTINUED] OLANZapine (ZYPREXA) 15 MG tablet Take 1 tablet (15 mg total) by mouth daily.   [DISCONTINUED] traZODone (DESYREL) 100 MG  tablet TAKE 1 TABLET (100 MG TOTAL) BY MOUTH AT BEDTIME AS NEEDED FOR FOR SLEEP.   No facility-administered medications prior to visit.    Review of Systems     Objective    BP 131/84   Pulse (!) 102   Temp (!) 97.3 F (36.3 C) (Oral)   Resp 16   Wt 139 lb 14.4 oz (63.5 kg)   SpO2 99%   BMI 25.59 kg/m  {Show previous vital signs (optional):23777}  Physical Exam Vitals and nursing note reviewed.  Constitutional:      General: She is awake. She is not in acute distress.    Appearance: Normal appearance. She is well-developed, well-groomed and normal weight. She is not  ill-appearing, toxic-appearing or diaphoretic.  HENT:     Head: Normocephalic and atraumatic.     Jaw: There is normal jaw occlusion. No trismus, tenderness, swelling or pain on movement.     Right Ear: Hearing, tympanic membrane, ear canal and external ear normal. There is no impacted cerumen.     Left Ear: Hearing, tympanic membrane, ear canal and external ear normal. There is no impacted cerumen.     Nose: Nose normal. No congestion or rhinorrhea.     Right Turbinates: Not enlarged, swollen or pale.     Left Turbinates: Not enlarged, swollen or pale.     Right Sinus: No maxillary sinus tenderness or frontal sinus tenderness.     Left Sinus: No maxillary sinus tenderness or frontal sinus tenderness.     Mouth/Throat:     Lips: Pink.     Mouth: Mucous membranes are moist. No injury.     Tongue: No lesions.     Pharynx: Oropharynx is clear. Uvula midline. No pharyngeal swelling, oropharyngeal exudate, posterior oropharyngeal erythema or uvula swelling.     Tonsils: No tonsillar exudate or tonsillar abscesses.  Eyes:     General: Lids are normal. Lids are everted, no foreign bodies appreciated. Vision grossly intact. Gaze aligned appropriately. No allergic shiner or visual field deficit.       Right eye: No discharge.        Left eye: No discharge.     Extraocular Movements: Extraocular movements intact.     Conjunctiva/sclera: Conjunctivae normal.     Right eye: Right conjunctiva is not injected. No exudate.    Left eye: Left conjunctiva is not injected. No exudate.    Pupils: Pupils are equal, round, and reactive to light.  Neck:     Thyroid: No thyroid mass, thyromegaly or thyroid tenderness.     Vascular: No carotid bruit.     Trachea: Trachea normal.  Cardiovascular:     Rate and Rhythm: Normal rate and regular rhythm.     Pulses: Normal pulses.          Carotid pulses are 2+ on the right side and 2+ on the left side.      Radial pulses are 2+ on the right side and 2+ on the  left side.       Dorsalis pedis pulses are 2+ on the right side and 2+ on the left side.       Posterior tibial pulses are 2+ on the right side and 2+ on the left side.     Heart sounds: Normal heart sounds, S1 normal and S2 normal. No murmur heard.   No friction rub. No gallop.  Pulmonary:     Effort: Pulmonary effort is normal. No respiratory distress.     Breath  sounds: Normal breath sounds and air entry. No stridor. No wheezing, rhonchi or rales.  Chest:     Chest wall: No tenderness.     Comments: Breast exam deferred; discussed 'know your lemons' campaign and self exam Abdominal:     General: Abdomen is flat. Bowel sounds are normal. There is no distension.     Palpations: Abdomen is soft. There is no mass.     Tenderness: There is no abdominal tenderness. There is no right CVA tenderness, left CVA tenderness, guarding or rebound.     Hernia: No hernia is present.  Genitourinary:    Comments: Exam deferred; denies complaints Musculoskeletal:        General: No swelling, tenderness, deformity or signs of injury. Normal range of motion.     Cervical back: Full passive range of motion without pain, normal range of motion and neck supple. No edema, rigidity or tenderness. No muscular tenderness.     Right lower leg: No edema.     Left lower leg: No edema.  Lymphadenopathy:     Cervical: No cervical adenopathy.     Right cervical: No superficial, deep or posterior cervical adenopathy.    Left cervical: No superficial, deep or posterior cervical adenopathy.  Skin:    General: Skin is warm and dry.     Capillary Refill: Capillary refill takes less than 2 seconds.     Coloration: Skin is not jaundiced or pale.     Findings: No bruising, erythema, lesion or rash.  Neurological:     General: No focal deficit present.     Mental Status: She is alert and oriented to person, place, and time. Mental status is at baseline.     GCS: GCS eye subscore is 4. GCS verbal subscore is 5. GCS motor  subscore is 6.     Sensory: Sensation is intact. No sensory deficit.     Motor: Motor function is intact. No weakness.     Coordination: Coordination is intact. Coordination normal.     Gait: Gait is intact. Gait normal.  Psychiatric:        Attention and Perception: Attention and perception normal.        Mood and Affect: Mood and affect normal.        Speech: Speech normal.        Behavior: Behavior normal. Behavior is cooperative.        Thought Content: Thought content normal.        Cognition and Memory: Cognition and memory normal.        Judgment: Judgment normal.     No results found for any visits on 02/25/21.  Assessment & Plan     Problem List Items Addressed This Visit       Other   Anxiety - Primary    Non descriptive "worsening" GAD 0 Does not wish to start tx      Relevant Medications   traZODone (DESYREL) 100 MG tablet   hydrOXYzine (ATARAX/VISTARIL) 25 MG tablet   busPIRone (BUSPAR) 30 MG tablet   Insomnia    Increase trazodone; can double dose qHS or add second tab once awake Report back      Relevant Medications   traZODone (DESYREL) 100 MG tablet   Panic attacks    Start BB; 2 weeks of qday and then BID      Relevant Medications   traZODone (DESYREL) 100 MG tablet   hydrOXYzine (ATARAX/VISTARIL) 25 MG tablet   busPIRone (BUSPAR) 30 MG tablet  propranolol (INDERAL) 40 MG tablet   Bipolar 1 disorder ([place])   Relevant Medications   OLANZapine (ZYPREXA) 15 MG tablet     Return in about 6 weeks (around 04/08/2021) for anxiety and depression.      Vonna Kotyk, FNP, have reviewed all documentation for this visit. The documentation on 02/25/21 for the exam, diagnosis, procedures, and orders are all accurate and complete.    Gwyneth Sprout, [place] [phone] (phone) [phone] (fax)  [place]

## 2021-02-25 NOTE — Assessment & Plan Note (Signed)
Non descriptive "worsening" GAD 0 Does not wish to start tx

## 2021-02-28 ENCOUNTER — Other Ambulatory Visit: Payer: Self-pay | Admitting: Family Medicine

## 2021-02-28 DIAGNOSIS — F5101 Primary insomnia: Secondary | ICD-10-CM

## 2021-02-28 DIAGNOSIS — F410 Panic disorder [episodic paroxysmal anxiety] without agoraphobia: Secondary | ICD-10-CM

## 2021-03-13 ENCOUNTER — Other Ambulatory Visit: Payer: Self-pay | Admitting: Physician Assistant

## 2021-03-13 DIAGNOSIS — K5904 Chronic idiopathic constipation: Secondary | ICD-10-CM

## 2021-03-14 MED ORDER — LINACLOTIDE 290 MCG PO CAPS: 290.0000 ug | ORAL_CAPSULE | Freq: Every day | ORAL | 1 refills | Status: DC

## 2021-03-17 ENCOUNTER — Ambulatory Visit: Payer: No Typology Code available for payment source | Admitting: Family Medicine

## 2021-04-08 ENCOUNTER — Ambulatory Visit: Payer: No Typology Code available for payment source | Admitting: Family Medicine

## 2021-04-08 ENCOUNTER — Encounter: Payer: Self-pay | Admitting: Family Medicine

## 2021-04-08 ENCOUNTER — Other Ambulatory Visit: Payer: Self-pay

## 2021-04-08 VITALS — BP 151/87 | HR 50 | Resp 16 | Wt 136.8 lb

## 2021-04-08 DIAGNOSIS — J029 Acute pharyngitis, unspecified: Secondary | ICD-10-CM | POA: Diagnosis not present

## 2021-04-08 DIAGNOSIS — R051 Acute cough: Secondary | ICD-10-CM | POA: Diagnosis not present

## 2021-04-08 DIAGNOSIS — R599 Enlarged lymph nodes, unspecified: Secondary | ICD-10-CM | POA: Diagnosis not present

## 2021-04-08 DIAGNOSIS — F419 Anxiety disorder, unspecified: Secondary | ICD-10-CM

## 2021-04-08 DIAGNOSIS — F5101 Primary insomnia: Secondary | ICD-10-CM

## 2021-04-08 DIAGNOSIS — G479 Sleep disorder, unspecified: Secondary | ICD-10-CM | POA: Diagnosis not present

## 2021-04-08 DIAGNOSIS — I10 Essential (primary) hypertension: Secondary | ICD-10-CM | POA: Insufficient documentation

## 2021-04-08 MED ORDER — GUAIFENESIN-CODEINE 100-10 MG/5ML PO SOLN: 10.0000 mL | Freq: Every evening | ORAL | 0 refills | Status: DC | PRN

## 2021-04-08 MED ORDER — PREDNISONE 20 MG PO TABS: 20.0000 mg | ORAL_TABLET | Freq: Every day | ORAL | 0 refills | Status: DC

## 2021-04-08 MED ORDER — GUAIFENESIN-DM 100-10 MG/5ML PO SYRP
10.0000 mL | ORAL_SOLUTION | ORAL | 1 refills | Status: DC | PRN
Start: 2021-04-08 — End: 2021-07-07

## 2021-04-08 MED ORDER — BENZONATATE 200 MG PO CAPS: 200.0000 mg | ORAL_CAPSULE | Freq: Two times a day (BID) | ORAL | 1 refills | Status: DC | PRN

## 2021-04-08 NOTE — Assessment & Plan Note (Signed)
Remains elevated; continue to advise heart healthy diet and stop smoking Weight remains almost within goal, BMI <25 Advise heart healthy diet, low salt diet

## 2021-04-08 NOTE — Assessment & Plan Note (Signed)
Likely viral; recommend cough syrup to assists

## 2021-04-08 NOTE — Assessment & Plan Note (Signed)
Well controlled 

## 2021-04-08 NOTE — Assessment & Plan Note (Signed)
Well controlled, despite of cough

## 2021-04-08 NOTE — Assessment & Plan Note (Signed)
Due to cough

## 2021-04-08 NOTE — Assessment & Plan Note (Signed)
Due do runny nose; recommend flonase

## 2021-04-08 NOTE — Progress Notes (Signed)
Established patient visit   Patient: Stephanie Ball   DOB: 1955/08/09   65 y.o. Female  MRN: [id number] Visit Date: 04/08/2021  Today's healthcare provider: Gwyneth Sprout, FNP   Chief Complaint  Patient presents with   Anxiety   Insomnia   Cough   Subjective    Cough This is a new problem. The current episode started 1 to 4 weeks ago. The problem has been unchanged. The cough is Productive of sputum. Pertinent negatives include no chest pain, chills, ear congestion, ear pain, fever, headaches, heartburn, hemoptysis, myalgias, nasal congestion, postnasal drip, rash, rhinorrhea, sore throat, shortness of breath, sweats, weight loss or wheezing. She has tried OTC cough suppressant for the symptoms. The treatment provided no relief. Her past medical history is significant for bronchitis.   Anxiety, Follow-up  She was last seen for anxiety 6 weeks ago. Changes made at last visit include   Start BB; 2 weeks of qday and then BID       She reports excellent compliance with treatment. She reports excellent tolerance of treatment. She is having side effects.   She feels her anxiety is mild and Improved since last visit.  Symptoms: No chest pain No difficulty concentrating  No dizziness No fatigue  No feelings of losing control No insomnia  No irritable No palpitations  No panic attacks No racing thoughts  No shortness of breath No sweating  No tremors/shakes    GAD-7 Results GAD-7 Generalized Anxiety Disorder Screening Tool 02/25/2021 10/13/2019  1. Feeling Nervous, Anxious, or on Edge 0 0  2. Not Being Able to Stop or Control Worrying 0 0  3. Worrying Too Much About Different Things 0 2  4. Trouble Relaxing 0 3  5. Being So Restless it's Hard To Sit Still 0 0  6. Becoming Easily Annoyed or Irritable 0 1  7. Feeling Afraid As If Something Awful Might Happen 0 2  Total GAD-7 Score 0 8  Difficulty At Work, Home, or Getting  Along With Others? Not difficult at all Not  difficult at all    PHQ-9 Scores PHQ9 SCORE ONLY 04/08/2021 02/25/2021 12/18/2020  PHQ-9 Total Score 2 3 2     ---------------------------------------------------------------------------------------------------  Follow up for Insomnia  The patient was last seen for this 6 weeks ago. Changes made at last visit include   Increase trazodone; can double dose qHS   .  She reports excellent compliance with treatment. She feels that condition is  slight improvement . She is not having side effects.   -----------------------------------------------------------------------------------------   Medications: Outpatient Medications Prior to Visit  Medication Sig   aspirin EC 81 MG tablet Take 81 mg by mouth daily.   busPIRone (BUSPAR) 30 MG tablet Take 1 tablet (30 mg total) by mouth 2 (two) times daily.   clotrimazole-betamethasone (LOTRISONE) cream APPLY TOPICALLY 2 TIMES DAILY   colchicine 0.6 MG tablet Take 2 tabs PO at onset of symptoms, and one tab PO daily until symptoms resolve. Repeat as needed   famotidine (PEPCID) 20 MG tablet Take 20 mg by mouth 2 (two) times daily.   hydrochlorothiazide (HYDRODIURIL) 12.5 MG tablet Take 1 tablet (12.5 mg total) by mouth daily.   hydrOXYzine (ATARAX/VISTARIL) 25 MG tablet Take 1 tablet (25 mg total) by mouth 3 (three) times daily as needed.   ibuprofen (ADVIL) 600 MG tablet Take 600 mg by mouth every 6 (six) hours as needed.   linaclotide (LINZESS) 290 MCG CAPS capsule Take 1 capsule (290 mcg  total) by mouth daily before breakfast.   lisinopril (ZESTRIL) 40 MG tablet Take 1 tablet (40 mg total) by mouth daily.   loratadine (CLARITIN) 10 MG tablet Take 10 mg by mouth daily.   montelukast (SINGULAIR) 10 MG tablet TAKE 1 TABLET BY MOUTH EVERY DAY   OLANZapine (ZYPREXA) 15 MG tablet Take 1 tablet (15 mg total) by mouth daily.   propranolol (INDERAL) 40 MG tablet TAKE 1 TABLET (40 MG TOTAL) BY MOUTH 2 (TWO) TIMES DAILY. START TAKING ONCE DAILY, BY MOUTH  FOR THE FIRST TWO WEEKS. THEN CAN TAKE TWICE DAILY, BY MOUTH.   rosuvastatin (CRESTOR) 20 MG tablet Take 1 tablet (20 mg total) by mouth daily.   traZODone (DESYREL) 100 MG tablet TAKE 1 TABLET (100 MG TOTAL) BY MOUTH AT BEDTIME AS NEEDED FOR FOR SLEEP.   venlafaxine (EFFEXOR) 75 MG tablet Take 1 tablet (75 mg total) by mouth 3 (three) times daily with meals.   zolpidem (AMBIEN) 10 MG tablet TAKE 1 TABLET BY MOUTH EVERY DAY AT BEDTIME AS NEEDED FOR SLEEP   albuterol (VENTOLIN HFA) 108 (90 Base) MCG/ACT inhaler TAKE 2 PUFFS BY MOUTH EVERY 6 HOURS AS NEEDED FOR WHEEZE OR SHORTNESS OF BREATH (Patient not taking: Reported on 04/08/2021)   No facility-administered medications prior to visit.    Review of Systems  Constitutional:  Negative for chills, fever and weight loss.  HENT:  Negative for ear pain, postnasal drip, rhinorrhea and sore throat.   Respiratory:  Positive for cough. Negative for hemoptysis, shortness of breath and wheezing.   Cardiovascular:  Negative for chest pain.  Gastrointestinal:  Negative for heartburn.  Musculoskeletal:  Negative for myalgias.  Skin:  Negative for rash.  Neurological:  Negative for headaches.      Objective    BP (!) 151/87   Pulse (!) 50   Resp 16   Wt 136 lb 12.8 oz (62.1 kg)   SpO2 100%   BMI 25.02 kg/m    Physical Exam Vitals and nursing note reviewed.  Constitutional:      General: She is not in acute distress.    Appearance: Normal appearance. She is overweight. She is not ill-appearing, toxic-appearing or diaphoretic.  HENT:     Head: Normocephalic and atraumatic.     Nose:     Right Sinus: No maxillary sinus tenderness or frontal sinus tenderness.     Left Sinus: No maxillary sinus tenderness or frontal sinus tenderness.     Mouth/Throat:     Pharynx: Posterior oropharyngeal erythema present. No oropharyngeal exudate or uvula swelling.     Tonsils: No tonsillar exudate.  Cardiovascular:     Rate and Rhythm: Normal rate and  regular rhythm.     Pulses: Normal pulses.     Heart sounds: Normal heart sounds. No murmur heard.   No friction rub. No gallop.  Pulmonary:     Effort: Pulmonary effort is normal. No respiratory distress.     Breath sounds: Normal breath sounds. No stridor. No wheezing, rhonchi or rales.  Chest:     Chest wall: No tenderness.  Abdominal:     General: Bowel sounds are normal.     Palpations: Abdomen is soft.  Musculoskeletal:        General: No swelling, tenderness, deformity or signs of injury. Normal range of motion.     Right lower leg: No edema.     Left lower leg: No edema.  Skin:    General: Skin is warm and dry.  Capillary Refill: Capillary refill takes less than 2 seconds.     Coloration: Skin is not jaundiced or pale.     Findings: No bruising, erythema, lesion or rash.  Neurological:     General: No focal deficit present.     Mental Status: She is alert and oriented to person, place, and time. Mental status is at baseline.     Cranial Nerves: No cranial nerve deficit.     Sensory: No sensory deficit.     Motor: No weakness.     Coordination: Coordination normal.  Psychiatric:        Mood and Affect: Mood normal.        Behavior: Behavior normal.        Thought Content: Thought content normal.        Judgment: Judgment normal.      No results found for any visits on 04/08/21.  Assessment & Plan     Problem List Items Addressed This Visit       Cardiovascular and Mediastinum   Primary hypertension    Remains elevated; continue to advise heart healthy diet and stop smoking Weight remains almost within goal, BMI <25 Advise heart healthy diet, low salt diet      Benign essential HTN     Immune and Lymphatic   Enlarged glands    Slight enlargement likely d/t sinus/discharge 7 day course of prednisone to assist with decrease in inflammation      Relevant Medications   predniSONE (DELTASONE) 20 MG tablet     Other   Anxiety    Well controlled       Insomnia    Well controlled, despite of cough      Acute cough - Primary    Likely viral; recommend cough syrup to assists      Relevant Medications   benzonatate (TESSALON) 200 MG capsule   guaiFENesin-dextromethorphan (ROBITUSSIN DM) 100-10 MG/5ML syrup   guaiFENesin-codeine 100-10 MG/5ML syrup   predniSONE (DELTASONE) 20 MG tablet   Sore throat    Due do runny nose; recommend flonase      Difficulty sleeping    Due to cough      Relevant Medications   guaiFENesin-codeine 100-10 MG/5ML syrup     Return in about 6 weeks (around 05/20/2021) for anxiety and depression, chonic disease management.      Vonna Kotyk, FNP, have reviewed all documentation for this visit. The documentation on 04/08/21 for the exam, diagnosis, procedures, and orders are all accurate and complete.    Gwyneth Sprout, [place] [phone] (phone) [phone] (fax)  [place]

## 2021-04-08 NOTE — Assessment & Plan Note (Signed)
Slight enlargement likely d/t sinus/discharge 7 day course of prednisone to assist with decrease in inflammation

## 2021-04-10 ENCOUNTER — Other Ambulatory Visit: Payer: Self-pay | Admitting: Family Medicine

## 2021-04-10 DIAGNOSIS — F410 Panic disorder [episodic paroxysmal anxiety] without agoraphobia: Secondary | ICD-10-CM

## 2021-04-23 ENCOUNTER — Other Ambulatory Visit: Payer: Self-pay | Admitting: Family Medicine

## 2021-04-23 DIAGNOSIS — F410 Panic disorder [episodic paroxysmal anxiety] without agoraphobia: Secondary | ICD-10-CM

## 2021-04-25 ENCOUNTER — Other Ambulatory Visit: Payer: Self-pay | Admitting: Family Medicine

## 2021-04-25 DIAGNOSIS — F410 Panic disorder [episodic paroxysmal anxiety] without agoraphobia: Secondary | ICD-10-CM

## 2021-04-25 NOTE — Telephone Encounter (Signed)
Requested Prescriptions  Pending Prescriptions Disp Refills   propranolol (INDERAL) 40 MG tablet [Pharmacy Med Name: PROPRANOLOL 40 MG TABLET] 180 tablet 0    Sig: TAKE 1 TABLET (40 MG TOTAL) BY MOUTH 2 (TWO) TIMES DAILY. START TAKING ONCE DAILY, BY MOUTH FOR THE FIRST TWO WEEKS. THEN CAN TAKE TWICE DAILY, BY MOUTH.     Cardiovascular:  Beta Blockers Failed - 04/25/2021  7:26 AM      Failed - Last BP in normal range    BP Readings from Last 1 Encounters:  04/08/21 (!) 151/87         Passed - Last Heart Rate in normal range    Pulse Readings from Last 1 Encounters:  04/08/21 (!) 50         Passed - Valid encounter within last 6 months    Recent Outpatient Visits          2 weeks ago Acute cough   [facility] Gwyneth Sprout, FNP   1 month ago [place] Gwyneth Sprout, FNP   4 months ago Wellness examination   [facility] Tally Joe T, FNP   5 months ago Benign essential HTN   [facility] Gwyneth Sprout, FNP   11 months ago Panic attacks   [place], [place]      Future Appointments            In 2 months Gwyneth Sprout, [facility]            hydrOXYzine (ATARAX) 25 MG tablet [employer] Med Name: HYDROXYZINE HCL 25 MG TABLET] 30 tablet 3    Sig: TAKE 1 TABLET BY MOUTH THREE TIMES A DAY AS NEEDED     Ear, Nose, and Throat:  Antihistamines Passed - 04/25/2021  7:26 AM      Passed - Valid encounter within last 12 months    Recent Outpatient Visits          2 weeks ago Acute cough   [facility] Gwyneth Sprout, FNP   1 month ago [place] Gwyneth Sprout, FNP   4 months ago Wellness examination   [facility] Tally Joe T, FNP   5 months ago Benign essential HTN   [facility] Gwyneth Sprout, FNP   11 months ago Panic attacks   [facility],  Clearnce Sorrel, [place]      Future Appointments            In 2 months Gwyneth Sprout, [place], [place]

## 2021-05-08 ENCOUNTER — Other Ambulatory Visit: Payer: Self-pay | Admitting: Family Medicine

## 2021-05-08 DIAGNOSIS — F410 Panic disorder [episodic paroxysmal anxiety] without agoraphobia: Secondary | ICD-10-CM

## 2021-05-19 ENCOUNTER — Other Ambulatory Visit: Payer: Self-pay | Admitting: Family Medicine

## 2021-05-19 DIAGNOSIS — I10 Essential (primary) hypertension: Secondary | ICD-10-CM

## 2021-05-19 DIAGNOSIS — F5101 Primary insomnia: Secondary | ICD-10-CM

## 2021-05-19 NOTE — Telephone Encounter (Signed)
Requested medication (s) are due for refill today: {Due 05/28/21  Requested medication (s) are on the active medication list: yes    Last refill: 11/25/20 #90   1 refill  Future visit scheduled yes 07/07/21  Notes to clinic:Labs will be due 2/23 Please review.  Requested Prescriptions  Pending Prescriptions Disp Refills   lisinopril (ZESTRIL) 40 MG tablet [Pharmacy Med Name: LISINOPRIL 40 MG TABLET] 90 tablet 1    Sig: TAKE 1 TABLET BY MOUTH EVERY DAY     Cardiovascular:  ACE Inhibitors Failed - 05/19/2021  6:10 PM      Failed - Cr in normal range and within 180 days    Creat  Date Value Ref Range Status  12/24/2020 1.57 (H) 0.50 - 1.05 mg/dL Final          Failed - K in normal range and within 180 days    Potassium  Date Value Ref Range Status  12/24/2020 3.1 (L) 3.5 - 5.3 mmol/L Final          Failed - Last BP in normal range    BP Readings from Last 1 Encounters:  04/08/21 (!) 151/87          Passed - Patient is not pregnant      Passed - Valid encounter within last 6 months    Recent Outpatient Visits           1 month ago Acute cough   [facility] Gwyneth Sprout, FNP   2 months ago [place] Gwyneth Sprout, FNP   5 months ago Wellness examination   [facility] Tally Joe T, FNP   5 months ago Benign essential HTN   [facility] Gwyneth Sprout, FNP   1 year ago Panic attacks   [place], Clearnce Sorrel, [place]       Future Appointments             In 1 month Rollene Rotunda, Jaci Standard, [place], [place]

## 2021-05-20 MED ORDER — ZOLPIDEM TARTRATE 10 MG PO TABS: ORAL_TABLET | ORAL | 0 refills | Status: DC

## 2021-05-22 ENCOUNTER — Telehealth: Payer: Self-pay

## 2021-05-22 ENCOUNTER — Encounter: Payer: Self-pay | Admitting: Family Medicine

## 2021-05-22 NOTE — Telephone Encounter (Signed)
Copied from [place] [id number]. Topic: General - Other >> May 22, 2021  4:26 PM Yvette Rack wrote: Reason for CRM: Pt spouse reports that a PA is needed for the Rx for zolpidem (AMBIEN) 10 MG tablet. He also stated that a PA is needed for the quantity as well.

## 2021-05-29 NOTE — Telephone Encounter (Signed)
See previous encounter pre-auth pending.KW

## 2021-06-03 NOTE — Telephone Encounter (Signed)
Pt husband has called and ins is processing this PA but he states may be 10 days or so before goes thru as they state back  up of PA's so pt requesting 10 pills or so for interim called in to local  [facility] [id number], [place]  [place] [address]  Phone: [phone] Fax: [phone]

## 2021-06-04 NOTE — Telephone Encounter (Signed)
Please review message below. KW

## 2021-06-08 ENCOUNTER — Other Ambulatory Visit: Payer: Self-pay | Admitting: Family Medicine

## 2021-06-08 DIAGNOSIS — F410 Panic disorder [episodic paroxysmal anxiety] without agoraphobia: Secondary | ICD-10-CM

## 2021-06-09 ENCOUNTER — Other Ambulatory Visit: Payer: Self-pay | Admitting: Family Medicine

## 2021-06-09 ENCOUNTER — Other Ambulatory Visit: Payer: Self-pay

## 2021-06-09 ENCOUNTER — Telehealth: Payer: Self-pay | Admitting: Family Medicine

## 2021-06-09 MED ORDER — VITAMIN D (ERGOCALCIFEROL) 1.25 MG (50000 UNIT) PO CAPS: 50000.0000 [IU] | ORAL_CAPSULE | ORAL | 3 refills | Status: DC

## 2021-06-09 NOTE — Telephone Encounter (Signed)
[facility] faxed refill request for the following medications:  Vitamin D, Ergocalciferol, (DRISDOL) 1.25 MG (50000 UNIT) CAPS capsule   Please advise.

## 2021-06-15 ENCOUNTER — Other Ambulatory Visit: Payer: Self-pay | Admitting: Family Medicine

## 2021-06-15 DIAGNOSIS — K5904 Chronic idiopathic constipation: Secondary | ICD-10-CM

## 2021-06-16 NOTE — Telephone Encounter (Signed)
Requested Prescriptions  Pending Prescriptions Disp Refills   LINZESS 290 MCG CAPS capsule [Pharmacy Med Name: LINZESS 290 MCG CAPSULE] 90 capsule 1    Sig: TAKE 1 CAPSULE BY MOUTH DAILY BEFORE BREAKFAST.     Gastroenterology: Irritable Bowel Syndrome Passed - 06/15/2021 10:19 AM      Passed - Valid encounter within last 12 months    Recent Outpatient Visits          2 months ago Acute cough   [facility] Gwyneth Sprout, FNP   3 months ago [place] Gwyneth Sprout, FNP   6 months ago Wellness examination   [facility] Tally Joe T, FNP   6 months ago Benign essential HTN   [facility] Gwyneth Sprout, FNP   1 year ago Panic attacks   [place], Clearnce Sorrel, [place]      Future Appointments            In 3 weeks Gwyneth Sprout, [place], [place]

## 2021-06-25 ENCOUNTER — Other Ambulatory Visit: Payer: Self-pay | Admitting: Family Medicine

## 2021-06-25 DIAGNOSIS — F410 Panic disorder [episodic paroxysmal anxiety] without agoraphobia: Secondary | ICD-10-CM

## 2021-06-25 NOTE — Telephone Encounter (Signed)
Requested Prescriptions  Pending Prescriptions Disp Refills   hydrOXYzine (ATARAX) 25 MG tablet [Pharmacy Med Name: HYDROXYZINE HCL 25 MG TABLET] 30 tablet 0    Sig: TAKE 1 TABLET BY MOUTH THREE TIMES A DAY AS NEEDED     Ear, Nose, and Throat:  Antihistamines 2 Failed - 06/25/2021 12:35 PM      Failed - Cr in normal range and within 360 days    Creat  Date Value Ref Range Status  12/24/2020 1.57 (H) 0.50 - 1.05 mg/dL Final         Passed - Valid encounter within last 12 months    Recent Outpatient Visits          2 months ago Acute cough   [facility] Gwyneth Sprout, FNP   4 months ago [place] Gwyneth Sprout, FNP   6 months ago Wellness examination   [facility] Tally Joe T, FNP   7 months ago Benign essential HTN   [facility] Gwyneth Sprout, FNP   1 year ago Panic attacks   [place], Clearnce Sorrel, [place]      Future Appointments            In 1 week Gwyneth Sprout, [place], [place]

## 2021-06-29 ENCOUNTER — Other Ambulatory Visit: Payer: Self-pay | Admitting: Family Medicine

## 2021-06-29 DIAGNOSIS — F3170 Bipolar disorder, currently in remission, most recent episode unspecified: Secondary | ICD-10-CM

## 2021-07-01 NOTE — Telephone Encounter (Signed)
Requested Prescriptions  Pending Prescriptions Disp Refills   venlafaxine (EFFEXOR) 75 MG tablet [Pharmacy Med Name: VENLAFAXINE HCL 75 MG TABLET] 270 tablet 1    Sig: TAKE 1 TABLET (75 MG TOTAL) BY MOUTH 3 (THREE) TIMES DAILY WITH MEALS.     Psychiatry: Antidepressants - SNRI - desvenlafaxine & venlafaxine Failed - 06/29/2021  9:51 AM      Failed - Cr in normal range and within 360 days    Creat  Date Value Ref Range Status  12/24/2020 1.57 (H) 0.50 - 1.05 mg/dL Final         Failed - Last BP in normal range    BP Readings from Last 1 Encounters:  04/08/21 (!) 151/87         Failed - Lipid Panel in normal range within the last 12 months    Cholesterol, Total  Date Value Ref Range Status  11/09/2018 194 100 - 199 mg/dL Final   Cholesterol  Date Value Ref Range Status  12/24/2020 178 <200 mg/dL Final   LDL Cholesterol (Calc)  Date Value Ref Range Status  12/24/2020 98 mg/dL (calc) Final    Comment:    Reference range: <100 . Desirable range <100 mg/dL for primary prevention;   <70 mg/dL for patients with CHD or diabetic patients  with > or = 2 CHD risk factors. Marland Kitchen LDL-C is now calculated using the Martin-Hopkins  calculation, which is a validated novel method providing  better accuracy than the Friedewald equation in the  estimation of LDL-C.  Cresenciano Genre et al. Annamaria Helling. 1829;937(16): 2061-2068  (http://education.[facility].com/faq/FAQ164)    HDL  Date Value Ref Range Status  12/24/2020 39 (L) > OR = 50 mg/dL Final  11/09/2018 42 >39 mg/dL Final   Triglycerides  Date Value Ref Range Status  12/24/2020 308 (H) <150 mg/dL Final    Comment:    . If a non-fasting specimen was collected, consider repeat triglyceride testing on a fasting specimen if clinically indicated.  Yates Decamp et al. J. of Clin. Lipidol. 9678;9:381-017. Marland Kitchen          Passed - Completed PHQ-2 or PHQ-9 in the last 360 days      Passed - Valid encounter within last 6 months    Recent  Outpatient Visits          2 months ago Acute cough   [facility] Gwyneth Sprout, FNP   4 months ago [place] Gwyneth Sprout, FNP   6 months ago Wellness examination   [facility] Tally Joe T, FNP   7 months ago Benign essential HTN   [facility] Gwyneth Sprout, FNP   1 year ago Panic attacks   [place], Clearnce Sorrel, PA-C      Future Appointments            In 6 days Gwyneth Sprout, [place], [place]

## 2021-07-07 ENCOUNTER — Encounter: Payer: Self-pay | Admitting: Family Medicine

## 2021-07-07 ENCOUNTER — Ambulatory Visit: Payer: No Typology Code available for payment source | Admitting: Family Medicine

## 2021-07-07 ENCOUNTER — Other Ambulatory Visit: Payer: Self-pay

## 2021-07-07 VITALS — BP 174/89 | HR 59 | Temp 97.8°F | Wt 137.0 lb

## 2021-07-07 DIAGNOSIS — F319 Bipolar disorder, unspecified: Secondary | ICD-10-CM

## 2021-07-07 DIAGNOSIS — Z1231 Encounter for screening mammogram for malignant neoplasm of breast: Secondary | ICD-10-CM | POA: Diagnosis not present

## 2021-07-07 DIAGNOSIS — I10 Essential (primary) hypertension: Secondary | ICD-10-CM | POA: Diagnosis not present

## 2021-07-07 DIAGNOSIS — F17210 Nicotine dependence, cigarettes, uncomplicated: Secondary | ICD-10-CM | POA: Insufficient documentation

## 2021-07-07 DIAGNOSIS — F410 Panic disorder [episodic paroxysmal anxiety] without agoraphobia: Secondary | ICD-10-CM

## 2021-07-07 DIAGNOSIS — Z716 Tobacco abuse counseling: Secondary | ICD-10-CM | POA: Insufficient documentation

## 2021-07-07 MED ORDER — AMLODIPINE BESYLATE 5 MG PO TABS: 5.0000 mg | ORAL_TABLET | Freq: Every day | ORAL | 0 refills | Status: DC

## 2021-07-07 NOTE — Assessment & Plan Note (Signed)
Patient slowly working on reducing intake of nicotine ?-discussions with link to HTN ?-discussing with link to anxiety ?-risk for heart attack/stroke ?-added cost ? ? ?

## 2021-07-07 NOTE — Progress Notes (Signed)
Ar[place]ina Ponder [place],acting as a scribe for Gwyneth Sprout, FNP.,have documented all relevant documentation on the behalf of Gwyneth Sprout, FNP,as directed by  Gwyneth Sprout, FNP while in the presence of Gwyneth Sprout, FNP.   Established patient visit   Patient: Stephanie Ball   DOB: January 15, 1956   66 y.o. Female  MRN: [id number] Visit Date: 07/07/2021  Today's healthcare provider: Gwyneth Sprout, FNP  Re-Introduced to nurse practitioner role and practice setting.  All questions answered.  Discussed provider/patient relationship and expectations.  No chief complaint on file.  Subjective    HPI   Anxiety and Depression, Follow-up  She  was last seen for this 5 months ago. Changes made at last visit include increasing Trazodone and started Propranolol.   She reports good compliance with treatment. She is not having side effects.   She reports good tolerance of treatment. Current symptoms include: recurrent thoughts of death; when teased out these thoughts are not of killing herself, or anyone else patient is worried that her husband will pass/die and then what would happen to her, worried if her husband is late... that he is dead. Encouraged patient to talk to her husband about this thought process and have open lines of communication. She feels she is Improved since last visit.  Depression screen [facility] 2/9 07/07/2021 04/08/2021 02/25/2021  Decreased Interest 1 0 0  Down, Depressed, Hopeless 1 0 0  PHQ - 2 Score 2 0 0  Altered sleeping 0 0 1  Tired, decreased energy '1 1 1  '$ Change in appetite '1 1 1  '$ Feeling bad or failure about yourself  0 0 0  Trouble concentrating 0 0 0  Moving slowly or fidgety/restless 0 0 0  Suicidal thoughts 1 0 0  PHQ-9 Score '5 2 3  '$ Difficult doing work/chores Somewhat difficult Not difficult at all -    -----------------------------------------------------------------------------------------   Medications: Outpatient Medications Prior to Visit   Medication Sig   aspirin EC 81 MG tablet Take 81 mg by mouth daily.   busPIRone (BUSPAR) 30 MG tablet Take 1 tablet (30 mg total) by mouth 2 (two) times daily.   clotrimazole-betamethasone (LOTRISONE) cream APPLY TOPICALLY 2 TIMES DAILY   colchicine 0.6 MG tablet Take 2 tabs PO at onset of symptoms, and one tab PO daily until symptoms resolve. Repeat as needed   famotidine (PEPCID) 20 MG tablet Take 20 mg by mouth 2 (two) times daily.   hydrochlorothiazide (HYDRODIURIL) 12.5 MG tablet Take 1 tablet (12.5 mg total) by mouth daily.   hydrOXYzine (ATARAX) 25 MG tablet TAKE 1 TABLET BY MOUTH THREE TIMES A DAY AS NEEDED   LINZESS 290 MCG CAPS capsule TAKE 1 CAPSULE BY MOUTH DAILY BEFORE BREAKFAST.   lisinopril (ZESTRIL) 40 MG tablet TAKE 1 TABLET BY MOUTH EVERY DAY   loratadine (CLARITIN) 10 MG tablet Take 10 mg by mouth daily.   montelukast (SINGULAIR) 10 MG tablet TAKE 1 TABLET BY MOUTH EVERY DAY   OLANZapine (ZYPREXA) 15 MG tablet Take 1 tablet (15 mg total) by mouth daily.   propranolol (INDERAL) 40 MG tablet Take 1 tablet (40 mg total) by mouth 2 (two) times daily.   rosuvastatin (CRESTOR) 20 MG tablet Take 1 tablet (20 mg total) by mouth daily.   traZODone (DESYREL) 100 MG tablet TAKE 1 TABLET (100 MG TOTAL) BY MOUTH AT BEDTIME AS NEEDED FOR FOR SLEEP.   venlafaxine (EFFEXOR) 75 MG tablet TAKE 1 TABLET (75 MG TOTAL) BY MOUTH  3 (THREE) TIMES DAILY WITH MEALS.   Vitamin D, Ergocalciferol, (DRISDOL) 1.25 MG (50000 UNIT) CAPS capsule Take 1 capsule (50,000 Units total) by mouth every 7 (seven) days.   zolpidem (AMBIEN) 10 MG tablet TAKE 1 TABLET BY MOUTH EVERY DAY AT BEDTIME AS NEEDED FOR SLEEP   [DISCONTINUED] benzonatate (TESSALON) 200 MG capsule Take 1 capsule (200 mg total) by mouth 2 (two) times daily as needed for cough. (Patient not taking: Reported on 07/07/2021)   [DISCONTINUED] guaiFENesin-codeine 100-10 MG/5ML syrup Take 10 mLs by mouth at bedtime and may repeat dose one time if needed.  (Patient not taking: Reported on 07/07/2021)   [DISCONTINUED] guaiFENesin-dextromethorphan (ROBITUSSIN DM) 100-10 MG/5ML syrup Take 10 mLs by mouth every 4 (four) hours as needed for cough. (Patient not taking: Reported on 07/07/2021)   [DISCONTINUED] ibuprofen (ADVIL) 600 MG tablet Take 600 mg by mouth every 6 (six) hours as needed. (Patient not taking: Reported on 07/07/2021)   [DISCONTINUED] predniSONE (DELTASONE) 20 MG tablet Take 1 tablet (20 mg total) by mouth daily with breakfast. (Patient not taking: Reported on 07/07/2021)   No facility-administered medications prior to visit.    Review of Systems  Psychiatric/Behavioral:  Negative for agitation, confusion, decreased concentration, dysphoric mood, sleep disturbance and suicidal ideas. The patient is nervous/anxious. The patient is not hyperactive.        Objective    BP (!) 174/89 (BP Location: Left Arm, Patient Position: Sitting, Cuff Size: Large)    Pulse (!) 59    Temp 97.8 F (36.6 C) (Oral)    Wt 137 lb (62.1 kg)    SpO2 100%    BMI 25.06 kg/m   Vitals:   07/07/21 0909 07/07/21 0910  BP: (!) 169/82 (!) 174/89  Pulse: (!) 59   Temp: 97.8 F (36.6 C)   TempSrc: Oral   SpO2: 100%   Weight: 137 lb (62.1 kg)       Physical Exam Vitals and nursing note reviewed.  Constitutional:      General: She is not in acute distress.    Appearance: Normal appearance. She is normal weight. She is not ill-appearing, toxic-appearing or diaphoretic.  HENT:     Head: Normocephalic and atraumatic.  Cardiovascular:     Rate and Rhythm: Normal rate and regular rhythm.     Pulses: Normal pulses.     Heart sounds: Normal heart sounds. No murmur heard.   No friction rub. No gallop.  Pulmonary:     Effort: Pulmonary effort is normal. No respiratory distress.     Breath sounds: Normal breath sounds. No stridor. No wheezing, rhonchi or rales.  Chest:     Chest wall: No tenderness.  Abdominal:     General: Bowel sounds are normal.      Palpations: Abdomen is soft.  Musculoskeletal:        General: No swelling, tenderness, deformity or signs of injury. Normal range of motion.     Right lower leg: No edema.     Left lower leg: No edema.  Skin:    General: Skin is warm and dry.     Capillary Refill: Capillary refill takes less than 2 seconds.     Coloration: Skin is not jaundiced or pale.     Findings: No bruising, erythema, lesion or rash.  Neurological:     General: No focal deficit present.     Mental Status: She is alert and oriented to person, place, and time. Mental status is at baseline.  Cranial Nerves: No cranial nerve deficit.     Sensory: No sensory deficit.     Motor: No weakness.     Coordination: Coordination normal.  Psychiatric:        Mood and Affect: Mood normal.        Behavior: Behavior normal.        Thought Content: Thought content normal.        Judgment: Judgment normal.      No results found for any visits on 07/07/21.  Assessment & Plan     Problem List Items Addressed This Visit       Cardiovascular and Mediastinum   Primary hypertension - Primary    Chronic, remains elevated Denies CP Denies SOB Denies DOE No LE Edema noted on exam Continue medication- HCTZ 12.5 mg, lisinopril 40 mg, add norvasc 5 mg Refills stable 1 month f/u with PAP Seek emergent care if you develop CP, chest pain or chest pressure       Relevant Medications   amLODipine (NORVASC) 5 MG tablet     Other   Bipolar 1 disorder (HCC)    Chronic, stable Recommend second opinion at psych given increase in PHQ in last month despite feeling better 4 "episodes" noted- worse one when patient was entering her home after coming back from caring for the grand-dog Discussed block breathing and use of therapeutic communication      Relevant Orders   Ambulatory referral to Psychiatry   Ambulatory referral to Psychology   Encounter for counseling for tobacco use disorder    3-5 minute discuss regarding risks  of tobacco/nicotine use and recommendations on ways to reduce use and work towards cessation of use of tobacco/nicotine products. Encouraged to use 1-800-QUIT-NOW.      Relevant Orders   Ambulatory referral to Smoking Cessation Program   Encounter for screening mammogram for malignant neoplasm of breast    Due at this time; patient has not completed Re-ordered Previously had had abnormal mammograms      Relevant Orders   MM 3D SCREEN BREAST BILATERAL   Panic attacks    Chronic, improving Had 4 episodes since last appt 1 of which was "bad" Patient denies SI or suicidal plan- broke that apart and helped her understand that she has been having thoughts of death as what would happed if her husband died; denies HI- contracted to safety, denies plan Referrals placed today for psychiatry and psychology given complex background and multiple medications in use      Relevant Orders   Ambulatory referral to Psychiatry   Ambulatory referral to Psychology   Tobacco dependence due to cigarettes    Patient slowly working on reducing intake of nicotine -discussions with link to HTN -discussing with link to anxiety -risk for heart attack/stroke -added cost        Relevant Orders   Ambulatory referral to Smoking Cessation Program     Return in about 4 weeks (around 08/04/2021) for HTN management.      Vonna Kotyk, FNP, have reviewed all documentation for this visit. The documentation on 07/07/21 for the exam, diagnosis, procedures, and orders are all accurate and complete.    Gwyneth Sprout, [place] [phone] (phone) [phone] (fax)  [place]

## 2021-07-07 NOTE — Assessment & Plan Note (Signed)
Due at this time; patient has not completed ?Re-ordered ?Previously had had abnormal mammograms ?

## 2021-07-07 NOTE — Assessment & Plan Note (Signed)
3-5 minute discuss regarding risks of tobacco/nicotine use and recommendations on ways to reduce use and work towards cessation of use of tobacco/nicotine products. Encouraged to use 1-800-QUIT-NOW. ? ?

## 2021-07-07 NOTE — Assessment & Plan Note (Signed)
Chronic, stable ?Recommend second opinion at psych given increase in PHQ in last month despite feeling better ?4 "episodes" noted- worse one when patient was entering her home after coming back from caring for the grand-dog ?Discussed block breathing and use of therapeutic communication ?

## 2021-07-07 NOTE — Assessment & Plan Note (Signed)
Chronic, improving ?Had 4 episodes since last appt ?1 of which was "bad" ?Patient denies SI or suicidal plan- broke that apart and helped her understand that she has been having thoughts of death as what would happed if her husband died; denies HI- contracted to safety, denies plan ?Referrals placed today for psychiatry and psychology given complex background and multiple medications in use ?

## 2021-07-07 NOTE — Assessment & Plan Note (Signed)
Chronic, remains elevated ?Denies CP ?Denies SOB ?Denies DOE ?No LE Edema noted on exam ?Continue medication- HCTZ 12.5 mg, lisinopril 40 mg, add norvasc 5 mg ?Refills stable ?1 month f/u with PAP ?Seek emergent care if you develop CP, chest pain or chest pressure ? ?

## 2021-07-08 ENCOUNTER — Other Ambulatory Visit: Payer: Self-pay | Admitting: Family Medicine

## 2021-07-08 DIAGNOSIS — F410 Panic disorder [episodic paroxysmal anxiety] without agoraphobia: Secondary | ICD-10-CM

## 2021-07-08 NOTE — Telephone Encounter (Signed)
Requested Prescriptions  ?Pending Prescriptions Disp Refills  ?? hydrOXYzine (ATARAX) 25 MG tablet [Pharmacy Med Name: HYDROXYZINE HCL 25 MG TABLET] 30 tablet 2  ?  Sig: TAKE 1 TABLET BY MOUTH THREE TIMES A DAY AS NEEDED  ?  ? Ear, Nose, and Throat:  Antihistamines 2 Failed - 07/08/2021  7:17 AM  ?  ?  Failed - Cr in normal range and within 360 days  ?  Creat  ?Date Value Ref Range Status  ?12/24/2020 1.57 (H) 0.50 - 1.05 mg/dL Final  ?   ?  ?  Passed - Valid encounter within last 12 months  ?  Recent Outpatient Visits   ?      ? Yesterday Primary hypertension  ? [facility] Tally Joe T, FNP  ? 3 months ago Acute cough  ? [facility] Tally Joe T, FNP  ? 4 months ago Anxiety  ? [facility] Tally Joe T, FNP  ? 6 months ago Wellness examination  ? [facility] Gwyneth Sprout, FNP  ? 7 months ago Benign essential HTN  ? [facility] Tally Joe T, FNP  ?  ?  ?Future Appointments   ?        ? In 4 weeks Gwyneth Sprout, [place], [facility]  ?  ? ?  ?  ?  ? ?

## 2021-07-20 ENCOUNTER — Other Ambulatory Visit: Payer: Self-pay | Admitting: Family Medicine

## 2021-07-20 DIAGNOSIS — F410 Panic disorder [episodic paroxysmal anxiety] without agoraphobia: Secondary | ICD-10-CM

## 2021-07-26 ENCOUNTER — Other Ambulatory Visit: Payer: Self-pay | Admitting: Family Medicine

## 2021-07-26 DIAGNOSIS — F3170 Bipolar disorder, currently in remission, most recent episode unspecified: Secondary | ICD-10-CM

## 2021-07-28 ENCOUNTER — Other Ambulatory Visit: Payer: Self-pay | Admitting: Family Medicine

## 2021-07-28 NOTE — Telephone Encounter (Signed)
Already refilled 07/01/21 3 month supply. Will refuse, requested too early. ? ?Requested Prescriptions  ?Pending Prescriptions Disp Refills  ?? venlafaxine (EFFEXOR) 75 MG tablet [Pharmacy Med Name: VENLAFAXINE HCL 75 MG TABLET] 270 tablet 0  ?  Sig: TAKE 1 TABLET (75 MG TOTAL) BY MOUTH 3 (THREE) TIMES DAILY WITH MEALS.  ?  ? Psychiatry: Antidepressants - SNRI - desvenlafaxine & venlafaxine Failed - 07/26/2021 11:09 AM  ?  ?  Failed - Cr in normal range and within 360 days  ?  Creat  ?Date Value Ref Range Status  ?12/24/2020 1.57 (H) 0.50 - 1.05 mg/dL Final  ?   ?  ?  Failed - Last BP in normal range  ?  BP Readings from Last 1 Encounters:  ?07/07/21 (!) 174/89  ?   ?  ?  Failed - Lipid Panel in normal range within the last 12 months  ?  Cholesterol, Total  ?Date Value Ref Range Status  ?11/09/2018 194 100 - 199 mg/dL Final  ? ?Cholesterol  ?Date Value Ref Range Status  ?12/24/2020 178 <200 mg/dL Final  ? ?LDL Cholesterol (Calc)  ?Date Value Ref Range Status  ?12/24/2020 98 mg/dL (calc) Final  ?  Comment:  ?  Reference range: <100 ?Marland Kitchen ?Desirable range <100 mg/dL for primary prevention;   ?<70 mg/dL for patients with CHD or diabetic patients  ?with > or = 2 CHD risk factors. ?. ?LDL-C is now calculated using the Martin-Hopkins  ?calculation, which is a validated novel method providing  ?better accuracy than the Friedewald equation in the  ?estimation of LDL-C.  ?Cresenciano Genre et al. Annamaria Helling. 0034;917(91): 2061-2068  ?(http://education.[facility].com/faq/FAQ164) ?  ? ?HDL  ?Date Value Ref Range Status  ?12/24/2020 39 (L) > OR = 50 mg/dL Final  ?11/09/2018 42 >39 mg/dL Final  ? ?Triglycerides  ?Date Value Ref Range Status  ?12/24/2020 308 (H) <150 mg/dL Final  ?  Comment:  ?  . ?If a non-fasting specimen was collected, consider ?repeat triglyceride testing on a fasting specimen ?if clinically indicated.  ?Garald Balding al. J. of Clin. Lipidol. 5056;9:794-801. ?. ?  ? ?  ?  ?  Passed - Completed PHQ-2 or PHQ-9 in the last 360  days  ?  ?  Passed - Valid encounter within last 6 months  ?  Recent Outpatient Visits   ?      ? 3 weeks ago Primary hypertension  ? [facility] Tally Joe T, FNP  ? 3 months ago Acute cough  ? [facility] Gwyneth Sprout, FNP  ? 5 months ago Anxiety  ? [facility] Gwyneth Sprout, FNP  ? 7 months ago Wellness examination  ? [facility] Gwyneth Sprout, FNP  ? 8 months ago Benign essential HTN  ? [facility] Tally Joe T, FNP  ?  ?  ?Future Appointments   ?        ? In 1 week Gwyneth Sprout, [place], [facility]  ?  ? ?  ?  ?  ? ? ?

## 2021-08-05 NOTE — Progress Notes (Signed)
?  ? ?[employer] as a Education administrator for Gwyneth Sprout, FNP.,have documented all relevant documentation on the behalf of Gwyneth Sprout, FNP,as directed by  Gwyneth Sprout, FNP while in the presence of Gwyneth Sprout, FNP.  ? ?Established patient visit ? ? ?Patient: Stephanie Ball   DOB: 07/13/1955   66 y.o. Female  MRN: [id number] ?Visit Date: 08/06/2021 ? ?Today's healthcare provider: Gwyneth Sprout, FNP  ? ?Re Introduced to nurse practitioner role and practice setting.  All questions answered.  Discussed provider/patient relationship and expectations. ? ? ?Chief Complaint  ?Patient presents with  ? Hypertension  ? Gynecologic Exam  ? ?Subjective  ?  ?HPI  ?Hypertension, follow-up ? ?BP Readings from Last 3 Encounters:  ?08/06/21 115/73  ?07/07/21 (!) 174/89  ?04/08/21 (!) 151/87  ? Wt Readings from Last 3 Encounters:  ?08/06/21 135 lb 14.4 oz (61.6 kg)  ?07/07/21 137 lb (62.1 kg)  ?04/08/21 136 lb 12.8 oz (62.1 kg)  ?  ? ?She was last seen for hypertension 1 months ago.  ?BP at that visit was 174/89. Management since that visit includes Continue medication- HCTZ 12.5 mg, lisinopril 40 mg, add norvasc 5 mg. ? ?She reports excellent compliance with treatment. ?She is not having side effects.  ?She is following a Regular diet. ?She is not exercising. ?She does not smoke. ? ?Use of agents associated with hypertension: NSAIDS.  ? ?Outside blood pressures are not checked. ?Symptoms: ?No chest pain No chest pressure  ?No palpitations No syncope  ?No dyspnea No orthopnea  ?No paroxysmal nocturnal dyspnea No lower extremity edema  ? ?Pertinent labs ?Lab Results  ?Component Value Date  ? CHOL 178 12/24/2020  ? HDL 39 (L) 12/24/2020  ? [place] 98 12/24/2020  ? TRIG 308 (H) 12/24/2020  ? CHOLHDL 4.6 12/24/2020  ? Lab Results  ?Component Value Date  ? NA 140 12/24/2020  ? K 3.1 (L) 12/24/2020  ? CREATININE 1.57 (H) 12/24/2020  ? GFRNONAA 34 (L) 02/16/2019  ? GLUCOSE 92 12/24/2020  ? TSH 2.75 12/24/2020  ?  ? ?The  10-year ASCVD risk score (Arnett DK, et al., 2019) is: 11.1% ? ?---------------------------------------------------------------------------------------------------  ? ?Medications: ?Outpatient Medications Prior to Visit  ?Medication Sig  ? amLODipine (NORVASC) 5 MG tablet Take 1 tablet (5 mg total) by mouth daily.  ? aspirin EC 81 MG tablet Take 81 mg by mouth daily.  ? busPIRone (BUSPAR) 30 MG tablet Take 1 tablet (30 mg total) by mouth 2 (two) times daily.  ? clotrimazole-betamethasone (LOTRISONE) cream APPLY TOPICALLY 2 TIMES DAILY  ? colchicine 0.6 MG tablet Take 2 tabs PO at onset of symptoms, and one tab PO daily until symptoms resolve. Repeat as needed  ? famotidine (PEPCID) 20 MG tablet Take 20 mg by mouth 2 (two) times daily.  ? hydrochlorothiazide (HYDRODIURIL) 12.5 MG tablet Take 1 tablet (12.5 mg total) by mouth daily.  ? hydrOXYzine (ATARAX) 25 MG tablet TAKE 1 TABLET BY MOUTH THREE TIMES A DAY AS NEEDED  ? LINZESS 290 MCG CAPS capsule TAKE 1 CAPSULE BY MOUTH DAILY BEFORE BREAKFAST.  ? lisinopril (ZESTRIL) 40 MG tablet TAKE 1 TABLET BY MOUTH EVERY DAY  ? loratadine (CLARITIN) 10 MG tablet Take 10 mg by mouth daily.  ? montelukast (SINGULAIR) 10 MG tablet TAKE 1 TABLET BY MOUTH EVERY DAY  ? OLANZapine (ZYPREXA) 15 MG tablet Take 1 tablet (15 mg total) by mouth daily.  ? propranolol (INDERAL) 40 MG tablet TAKE 1 TABLET BY  MOUTH TWICE A DAY  ? rosuvastatin (CRESTOR) 20 MG tablet TAKE 1 TABLET BY MOUTH EVERY DAY  ? traZODone (DESYREL) 100 MG tablet TAKE 1 TABLET (100 MG TOTAL) BY MOUTH AT BEDTIME AS NEEDED FOR FOR SLEEP.  ? venlafaxine (EFFEXOR) 75 MG tablet TAKE 1 TABLET (75 MG TOTAL) BY MOUTH 3 (THREE) TIMES DAILY WITH MEALS.  ? Vitamin D, Ergocalciferol, (DRISDOL) 1.25 MG (50000 UNIT) CAPS capsule Take 1 capsule (50,000 Units total) by mouth every 7 (seven) days.  ? zolpidem (AMBIEN) 10 MG tablet TAKE 1 TABLET BY MOUTH EVERY DAY AT BEDTIME AS NEEDED FOR SLEEP  ? ?No facility-administered medications  prior to visit.  ? ? ?Review of Systems ? ? ?  Objective  ?  ?BP 115/73   Pulse (!) 48   Temp 98 ?F (36.7 ?C) (Temporal)   Resp 15   Wt 135 lb 14.4 oz (61.6 kg)   SpO2 98%   BMI 24.86 kg/m?  ? ? ?Physical Exam ?Vitals and nursing note reviewed. Exam conducted with a chaperone present.  ?Constitutional:   ?   General: She is not in acute distress. ?   Appearance: Normal appearance. She is normal weight. She is not ill-appearing, toxic-appearing or diaphoretic.  ?HENT:  ?   Head: Normocephalic and atraumatic.  ?Cardiovascular:  ?   Rate and Rhythm: Normal rate and regular rhythm.  ?   Pulses: Normal pulses.  ?   Heart sounds: Normal heart sounds. No murmur heard. ?  No friction rub. No gallop.  ?Pulmonary:  ?   Effort: Pulmonary effort is normal. No respiratory distress.  ?   Breath sounds: Normal breath sounds. No stridor. No wheezing, rhonchi or rales.  ?Chest:  ?   Chest wall: No tenderness.  ?Abdominal:  ?   General: Bowel sounds are normal.  ?   Palpations: Abdomen is soft.  ?Genitourinary: ?   General: Normal vulva.  ?   Exam position: Lithotomy position.  ?   Tanner stage (genital): 5.  ?   Vagina: Vaginal discharge present.  ? ? ? ?   Comments: S/p hysterectomy; request for vaginal cancer screening ?Musculoskeletal:     ?   General: No swelling, tenderness, deformity or signs of injury. Normal range of motion.  ?   Right lower leg: No edema.  ?   Left lower leg: No edema.  ?Skin: ?   General: Skin is warm and dry.  ?   Capillary Refill: Capillary refill takes less than 2 seconds.  ?   Coloration: Skin is not jaundiced or pale.  ?   Findings: No bruising, erythema, lesion or rash.  ?Neurological:  ?   General: No focal deficit present.  ?   Mental Status: She is alert and oriented to person, place, and time. Mental status is at baseline.  ?   Cranial Nerves: No cranial nerve deficit.  ?   Sensory: No sensory deficit.  ?   Motor: No weakness.  ?   Coordination: Coordination normal.  ?Psychiatric:     ?    Mood and Affect: Mood normal.     ?   Behavior: Behavior normal.     ?   Thought Content: Thought content normal.     ?   Judgment: Judgment normal.  ?  ? ?No results found for any visits on 08/06/21. ? Assessment & Plan  ?  ? ?Problem List Items Addressed This Visit   ? ?  ? Cardiovascular and Mediastinum  ?  Primary hypertension - Primary  ?  Chronic, now stable with additional Rx ?Denies CP ?Denies SOB/ DOE ?Denies low blood pressure/hypotension ?Denies vision changes ?No LE Edema noted on exam ?Continue medication, Norvasc 5 mg, ASA 81 mg, HCTZ 12.5 mg, Lisinopril 40 mg, Propanolol 40 mg ?Denies side effects ?RTC 4 months for CPE ?Seek emergent care if you develop chest pain or chest pressure ? ?  ?  ?  ? Other  ? Screening for vaginal cancer  ?  Hx of hysterectomy ?Last vaginal screening 5 years ago; negative ?Repeat at this time ?Vaginal discharge present without complaints ?No internal organs since hysterectomy ?Advised no additional screening needed after 65 unless requested/desired ?  ?  ? Relevant Orders  ? Cytology - PAP  ? Tobacco dependence due to cigarettes  ?  Chronic, remains pre contemplative about full cessation ?Previous discussion included link between HTN and anxiety and insomnia, as well as additional cost ?Continue to reinforce full cessation from nicotine containing tobacco products ?  ?  ? ? ? ?Return in about 4 months (around 12/06/2021) for annual examination.  ?   ? ?I, Gwyneth Sprout, FNP, have reviewed all documentation for this visit. The documentation on 08/06/21 for the exam, diagnosis, procedures, and orders are all accurate and complete. ? ?Gwyneth Sprout, FNP  ?[place] ?[phone] (phone) ?[phone] (fax) ? ?[facility] ?

## 2021-08-06 ENCOUNTER — Other Ambulatory Visit (HOSPITAL_COMMUNITY)
Admission: RE | Admit: 2021-08-06 | Discharge: 2021-08-06 | Disposition: A | Discharge status: Home / Self Care | Payer: No Typology Code available for payment source | Source: Ambulatory Visit | Attending: Family Medicine | Admitting: Family Medicine

## 2021-08-06 ENCOUNTER — Ambulatory Visit (INDEPENDENT_AMBULATORY_CARE_PROVIDER_SITE_OTHER): Payer: No Typology Code available for payment source | Admitting: Family Medicine

## 2021-08-06 ENCOUNTER — Encounter: Payer: Self-pay | Admitting: Family Medicine

## 2021-08-06 VITALS — BP 115/73 | HR 48 | Temp 98.0°F | Resp 15 | Wt 135.9 lb

## 2021-08-06 DIAGNOSIS — Z1272 Encounter for screening for malignant neoplasm of vagina: Secondary | ICD-10-CM | POA: Insufficient documentation

## 2021-08-06 DIAGNOSIS — Z124 Encounter for screening for malignant neoplasm of cervix: Secondary | ICD-10-CM

## 2021-08-06 DIAGNOSIS — I10 Essential (primary) hypertension: Secondary | ICD-10-CM

## 2021-08-06 DIAGNOSIS — F17210 Nicotine dependence, cigarettes, uncomplicated: Secondary | ICD-10-CM

## 2021-08-06 NOTE — Assessment & Plan Note (Signed)
Chronic, remains pre contemplative about full cessation ?Previous discussion included link between HTN and anxiety and insomnia, as well as additional cost ?Continue to reinforce full cessation from nicotine containing tobacco products ?

## 2021-08-06 NOTE — Assessment & Plan Note (Signed)
Chronic, now stable with additional Rx ?Denies CP ?Denies SOB/ DOE ?Denies low blood pressure/hypotension ?Denies vision changes ?No LE Edema noted on exam ?Continue medication, Norvasc 5 mg, ASA 81 mg, HCTZ 12.5 mg, Lisinopril 40 mg, Propanolol 40 mg ?Denies side effects ?RTC 4 months for CPE ?Seek emergent care if you develop chest pain or chest pressure ? ?

## 2021-08-06 NOTE — Assessment & Plan Note (Signed)
Hx of hysterectomy ?Last vaginal screening 5 years ago; negative ?Repeat at this time ?Vaginal discharge present without complaints ?No internal organs since hysterectomy ?Advised no additional screening needed after 65 unless requested/desired ?

## 2021-08-12 LAB — CYTOLOGY - PAP: Diagnosis: NEGATIVE

## 2021-08-18 ENCOUNTER — Ambulatory Visit
Admission: RE | Admit: 2021-08-18 | Discharge: 2021-08-18 | Disposition: A | Discharge status: Home / Self Care | Payer: No Typology Code available for payment source | Source: Ambulatory Visit | Attending: Family Medicine | Admitting: Family Medicine

## 2021-08-18 DIAGNOSIS — Z1231 Encounter for screening mammogram for malignant neoplasm of breast: Secondary | ICD-10-CM | POA: Diagnosis present

## 2021-08-20 ENCOUNTER — Other Ambulatory Visit: Payer: Self-pay | Admitting: Family Medicine

## 2021-08-20 DIAGNOSIS — F5101 Primary insomnia: Secondary | ICD-10-CM

## 2021-08-22 MED ORDER — ZOLPIDEM TARTRATE 10 MG PO TABS: ORAL_TABLET | ORAL | 0 refills | Status: DC

## 2021-08-24 ENCOUNTER — Other Ambulatory Visit: Payer: Self-pay | Admitting: Family Medicine

## 2021-08-24 DIAGNOSIS — F410 Panic disorder [episodic paroxysmal anxiety] without agoraphobia: Secondary | ICD-10-CM

## 2021-08-24 DIAGNOSIS — F3170 Bipolar disorder, currently in remission, most recent episode unspecified: Secondary | ICD-10-CM

## 2021-08-26 MED ORDER — HYDROXYZINE HCL 25 MG PO TABS: 25.0000 mg | ORAL_TABLET | Freq: Three times a day (TID) | ORAL | 2 refills | Status: DC | PRN

## 2021-08-26 NOTE — Telephone Encounter (Signed)
Requested Prescriptions  ?Pending Prescriptions Disp Refills  ?? hydrOXYzine (ATARAX) 25 MG tablet 30 tablet 2  ?  Sig: Take 1 tablet (25 mg total) by mouth 3 (three) times daily as needed.  ?  ? Ear, Nose, and Throat:  Antihistamines 2 Failed - 08/26/2021  9:40 AM  ?  ?  Failed - Cr in normal range and within 360 days  ?  Creat  ?Date Value Ref Range Status  ?12/24/2020 1.57 (H) 0.50 - 1.05 mg/dL Final  ?   ?  ?  Passed - Valid encounter within last 12 months  ?  Recent Outpatient Visits   ?      ? 2 weeks ago Primary hypertension  ? [place] T, FNP  ? 1 month ago Primary hypertension  ? [facility] Tally [facility] T, FNP  ? 4 months ago Acute cough  ? [facility] Tally Joe T, FNP  ? 6 months ago Anxiety  ? [facility] Gwyneth Sprout, FNP  ? 8 months ago Wellness examination  ? [facility] Tally Joe T, FNP  ?  ?  ?Future Appointments   ?        ? In 3 months Gwyneth Sprout, [facility]  ?  ? ?  ?  ?  ?Signed Prescriptions Disp Refills  ? venlafaxine (EFFEXOR) 75 MG tablet 270 tablet 0  ?  Sig: TAKE 1 TABLET (75 MG TOTAL) BY MOUTH 3 (THREE) TIMES DAILY WITH MEALS.  ?  ? Psychiatry: Antidepressants - SNRI - desvenlafaxine & venlafaxine Failed - 08/24/2021 11:51 AM  ?  ?  Failed - Cr in normal range and within 360 days  ?  Creat  ?Date Value Ref Range Status  ?12/24/2020 1.57 (H) 0.50 - 1.05 mg/dL Final  ?   ?  ?  Failed - Lipid Panel in normal range within the last 12 months  ?  Cholesterol, Total  ?Date Value Ref Range Status  ?11/09/2018 194 100 - 199 mg/dL Final  ? ?Cholesterol  ?Date Value Ref Range Status  ?12/24/2020 178 <200 mg/dL Final  ? ?LDL Cholesterol (Calc)  ?Date Value Ref Range Status  ?12/24/2020 98 mg/dL (calc) Final  ?  Comment:  ?  Reference range: <100 ?Marland Kitchen ?Desirable range <100 mg/dL for primary prevention;   ?<70 mg/dL for patients with CHD or diabetic patients  ?with > or = 2 CHD  risk factors. ?. ?LDL-C is now calculated using the Martin-Hopkins  ?calculation, which is a validated novel method providing  ?better accuracy than the Friedewald equation in the  ?estimation of LDL-C.  ?Cresenciano Genre et al. Annamaria Helling. 5409;811(91): 2061-2068  ?(http://education.[employer].com/faq/FAQ164) ?  ? ?HDL  ?Date Value Ref Range Status  ?12/24/2020 39 (L) > OR = 50 mg/dL Final  ?11/09/2018 42 >39 mg/dL Final  ? ?Triglycerides  ?Date Value Ref Range Status  ?12/24/2020 308 (H) <150 mg/dL Final  ?  Comment:  ?  . ?If a non-fasting specimen was collected, consider ?repeat triglyceride testing on a fasting specimen ?if clinically indicated.  ?Garald Balding al. J. of Clin. Lipidol. 4782;9:562-130. ?. ?  ? ?  ?  ?  Passed - Completed PHQ-2 or PHQ-9 in the last 360 days  ?  ?  Passed - Last BP in normal range  ?  BP Readings from Last 1 Encounters:  ?08/06/21 115/73  ?   ?  ?  Passed - Valid encounter within  last 6 months  ?  Recent Outpatient Visits   ?      ? 2 weeks ago Primary hypertension  ? [place] T, FNP  ? 1 month ago Primary hypertension  ? [facility] Tally Joe T, FNP  ? 4 months ago Acute cough  ? [facility] Tally Joe T, FNP  ? 6 months ago Anxiety  ? [facility] Gwyneth Sprout, FNP  ? 8 months ago Wellness examination  ? [facility] Tally Joe T, FNP  ?  ?  ?Future Appointments   ?        ? In 3 months Gwyneth Sprout, FNP [facility], [facility]  ?  ? ?  ?  ?  ?Refused Prescriptions Disp Refills  ?? hydrOXYzine (ATARAX) 25 MG tablet [Pharmacy Med Name: HYDROXYZINE HCL 25 MG TABLET] 30 tablet 2  ?  Sig: TAKE 1 TABLET BY MOUTH THREE TIMES A DAY AS NEEDED  ?  ? Ear, Nose, and Throat:  Antihistamines 2 Failed - 08/26/2021  9:40 AM  ?  ?  Failed - Cr in normal range and within 360 days  ?  Creat  ?Date Value Ref Range Status  ?12/24/2020 1.57 (H) 0.50 - 1.05 mg/dL Final  ?   ?  ?  Passed - Valid encounter  within last 12 months  ?  Recent Outpatient Visits   ?      ? 2 weeks ago Primary hypertension  ? [place] T, FNP  ? 1 month ago Primary hypertension  ? [facility] Tally Joe T, FNP  ? 4 months ago Acute cough  ? [facility] Tally Joe T, FNP  ? 6 months ago Anxiety  ? [facility] Gwyneth Sprout, FNP  ? 8 months ago Wellness examination  ? [facility] Tally Joe T, FNP  ?  ?  ?Future Appointments   ?        ? In 3 months Gwyneth Sprout, [place], [facility]  ?  ? ?  ?  ?  ? ?

## 2021-08-26 NOTE — Telephone Encounter (Signed)
Pt's husband called reporting that the patient is completely out of her current supply. She is requesting a refill of hydrOXYzine (ATARAX) 25 MG tablet ? ?[id number] [id number] ?Phone: [phone]: [phone] ? ?

## 2021-08-26 NOTE — Addendum Note (Signed)
Addended by: Matilde Sprang on: 08/26/2021 09:40 AM ? ? Modules accepted: Orders ? ?

## 2021-09-17 ENCOUNTER — Other Ambulatory Visit: Payer: Self-pay | Admitting: Family Medicine

## 2021-09-17 DIAGNOSIS — F410 Panic disorder [episodic paroxysmal anxiety] without agoraphobia: Secondary | ICD-10-CM

## 2021-09-17 NOTE — Telephone Encounter (Signed)
Requested Prescriptions  ?Pending Prescriptions Disp Refills  ?? hydrOXYzine (ATARAX) 25 MG tablet [Pharmacy Med Name: HYDROXYZINE HCL 25 MG TABLET] 270 tablet 1  ?  Sig: TAKE 1 TABLET BY MOUTH THREE TIMES A DAY AS NEEDED  ?  ? Ear, Nose, and Throat:  Antihistamines 2 Failed - 09/17/2021  2:35 PM  ?  ?  Failed - Cr in normal range and within 360 days  ?  Creat  ?Date Value Ref Range Status  ?12/24/2020 1.57 (H) 0.50 - 1.05 mg/dL Final  ?   ?  ?  Passed - Valid encounter within last 12 months  ?  Recent Outpatient Visits   ?      ? 1 month ago Primary hypertension  ? [facility] Tally Joe T, FNP  ? 2 months ago Primary hypertension  ? [facility] Gwyneth Sprout, FNP  ? 5 months ago Acute cough  ? [facility] Tally Joe T, FNP  ? 6 months ago Anxiety  ? [facility] Gwyneth Sprout, FNP  ? 9 months ago Wellness examination  ? [facility] Tally Joe T, FNP  ?  ?  ?Future Appointments   ?        ? In 3 months Gwyneth Sprout, [place], [facility]  ?  ? ?  ?  ?  ? ? ?

## 2021-09-18 ENCOUNTER — Other Ambulatory Visit: Payer: Self-pay | Admitting: Family Medicine

## 2021-09-18 DIAGNOSIS — J302 Other seasonal allergic rhinitis: Secondary | ICD-10-CM

## 2021-09-18 NOTE — Telephone Encounter (Signed)
Pt has refills available. Requested Prescriptions  Pending Prescriptions Disp Refills  . montelukast (SINGULAIR) 10 MG tablet [Pharmacy Med Name: MONTELUKAST SOD 10 MG TABLET] 90 tablet 3    Sig: TAKE 1 TABLET BY MOUTH EVERY DAY     Pulmonology:  Leukotriene Inhibitors Passed - 09/18/2021  4:13 PM      Passed - Valid encounter within last 12 months    Recent Outpatient Visits          1 month ago Primary hypertension   [facility] Gwyneth Sprout, FNP   2 months ago Primary hypertension   [facility] Tally Joe T, FNP   5 months ago Acute cough   [facility] Gwyneth Sprout, FNP   6 months ago [place] Gwyneth Sprout, FNP   9 months ago Wellness examination   [facility] Gwyneth Sprout, FNP      Future Appointments            In 3 months Gwyneth Sprout, [place], [place]

## 2021-09-22 ENCOUNTER — Other Ambulatory Visit: Payer: Self-pay | Admitting: Family Medicine

## 2021-09-22 DIAGNOSIS — I10 Essential (primary) hypertension: Secondary | ICD-10-CM

## 2021-10-07 LAB — LIPID PANEL
Cholesterol: 143 (ref 0–200)
HDL: 40 (ref 35–70)
LDL Cholesterol: 70
LDl/HDL Ratio: 3.6

## 2021-10-07 LAB — HEMOGLOBIN A1C: Hemoglobin A1C: 5

## 2021-10-13 ENCOUNTER — Telehealth: Payer: Self-pay

## 2021-10-13 NOTE — Telephone Encounter (Signed)
Copied from [place] [phone]. Topic: Complaint - Billing/Coding >> Oct 13, 2021  9:39 AM Cyndi Bender wrote: DOS: 08/06/21 and 11/25/20 Details of complaint: Pt stated she received 2 bills and needs to speak with someone in coding dept to correct the billing How would the patient like to see this issue resolved? Pt requests that the coding be corrected because she should not have received any bills   Route to Engineer, building services.

## 2021-10-15 NOTE — Telephone Encounter (Signed)
Spoke with patient and informed her I have reached out to billing dept.  Will follow up once I hear back from them.

## 2021-10-16 ENCOUNTER — Other Ambulatory Visit: Payer: Self-pay | Admitting: Family Medicine

## 2021-10-16 DIAGNOSIS — J302 Other seasonal allergic rhinitis: Secondary | ICD-10-CM

## 2021-10-16 DIAGNOSIS — F410 Panic disorder [episodic paroxysmal anxiety] without agoraphobia: Secondary | ICD-10-CM

## 2021-10-16 DIAGNOSIS — F3170 Bipolar disorder, currently in remission, most recent episode unspecified: Secondary | ICD-10-CM

## 2021-10-31 ENCOUNTER — Other Ambulatory Visit: Payer: Self-pay | Admitting: Family Medicine

## 2021-10-31 DIAGNOSIS — F5101 Primary insomnia: Secondary | ICD-10-CM

## 2021-10-31 NOTE — Telephone Encounter (Signed)
Requested Prescriptions  Pending Prescriptions Disp Refills  . traZODone (DESYREL) 100 MG tablet [Pharmacy Med Name: TRAZODONE 100 MG TABLET] 90 tablet 0    Sig: TAKE 1 TABLET (100 MG TOTAL) BY MOUTH AT BEDTIME AS NEEDED FOR FOR SLEEP.     Psychiatry: Antidepressants - Serotonin Modulator Passed - 10/31/2021  8:30 AM      Passed - Completed PHQ-2 or PHQ-9 in the last 360 days      Passed - Valid encounter within last 6 months    Recent Outpatient Visits          2 months ago Primary hypertension   [facility] Gwyneth Sprout, FNP   3 months ago Primary hypertension   [facility] Gwyneth Sprout, FNP   6 months ago Acute cough   [facility] Gwyneth Sprout, FNP   8 months ago [place] Gwyneth Sprout, FNP   10 months ago Wellness examination   [facility] Gwyneth Sprout, FNP      Future Appointments            In 1 month Gwyneth Sprout, [place], [place]

## 2021-11-12 ENCOUNTER — Other Ambulatory Visit: Payer: Self-pay | Admitting: Family Medicine

## 2021-11-12 DIAGNOSIS — F5101 Primary insomnia: Secondary | ICD-10-CM

## 2021-11-12 MED ORDER — ZOLPIDEM TARTRATE 10 MG PO TABS: ORAL_TABLET | ORAL | 0 refills | Status: DC

## 2021-11-25 ENCOUNTER — Other Ambulatory Visit: Payer: Self-pay | Admitting: Family Medicine

## 2021-11-25 DIAGNOSIS — K5904 Chronic idiopathic constipation: Secondary | ICD-10-CM

## 2021-12-11 ENCOUNTER — Other Ambulatory Visit: Payer: Self-pay | Admitting: Family Medicine

## 2021-12-11 DIAGNOSIS — I10 Essential (primary) hypertension: Secondary | ICD-10-CM

## 2021-12-11 DIAGNOSIS — F319 Bipolar disorder, unspecified: Secondary | ICD-10-CM

## 2021-12-11 NOTE — Telephone Encounter (Signed)
Requested medication (s) are due for refill today:   Yes for HCTZ, provider to review Zypexa  Requested medication (s) are on the active medication list:   Yes for both  Future visit scheduled:   Yes in 1 wk   Last ordered: HCTZ 01/08/2021 #90, 3 refills;  Zypexa 02/25/2021 #90, 3 refills  Returned because non delegated refill and labs due.   Requested Prescriptions  Pending Prescriptions Disp Refills   hydrochlorothiazide (HYDRODIURIL) 12.5 MG tablet [Pharmacy Med Name: HYDROCHLOROTHIAZIDE 12.5 MG TB] 90 tablet 3    Sig: TAKE 1 TABLET BY MOUTH EVERY DAY     Cardiovascular: Diuretics - Thiazide Failed - 12/11/2021  7:37 AM      Failed - Cr in normal range and within 180 days    Creat  Date Value Ref Range Status  12/24/2020 1.57 (H) 0.50 - 1.05 mg/dL Final         Failed - K in normal range and within 180 days    Potassium  Date Value Ref Range Status  12/24/2020 3.1 (L) 3.5 - 5.3 mmol/L Final         Failed - Na in normal range and within 180 days    Sodium  Date Value Ref Range Status  12/24/2020 140 135 - 146 mmol/L Final  11/09/2018 139 134 - 144 mmol/L Final         Passed - Last BP in normal range    BP Readings from Last 1 Encounters:  08/06/21 115/73         Passed - Valid encounter within last 6 months    Recent Outpatient Visits           4 months ago Primary hypertension   [facility] Gwyneth Sprout, FNP   5 months ago Primary hypertension   [facility] Tally Joe T, FNP   8 months ago Acute cough   [facility] Gwyneth Sprout, FNP   9 months ago [place] Gwyneth Sprout, FNP   11 months ago Wellness examination   [facility] Tally Joe T, FNP       Future Appointments             In 1 week Gwyneth Sprout, [place], [facility]             OLANZapine (ZYPREXA) 15 MG tablet [Pharmacy Med Name: OLANZAPINE 15 MG TABLET] 90 tablet 3    Sig:  Take 1 tablet (15 mg total) by mouth daily.     Not Delegated - Psychiatry:  Antipsychotics - Second Generation (Atypical) - olanzapine Failed - 12/11/2021  7:37 AM      Failed - This refill cannot be delegated      Failed - Lipid Panel in normal range within the last 12 months    Cholesterol, Total  Date Value Ref Range Status  11/09/2018 194 100 - 199 mg/dL Final   Cholesterol  Date Value Ref Range Status  12/24/2020 178 <200 mg/dL Final   LDL Cholesterol (Calc)  Date Value Ref Range Status  12/24/2020 98 mg/dL (calc) Final    Comment:    Reference range: <100 . Desirable range <100 mg/dL for primary prevention;   <70 mg/dL for patients with CHD or diabetic patients  with > or = 2 CHD risk factors. Marland Kitchen LDL-C is now calculated using the Martin-Hopkins  calculation, which is a validated novel method providing  better accuracy  than the Friedewald equation in the  estimation of LDL-C.  Cresenciano Genre et al. Annamaria Helling. 1610;960(45): 2061-2068  (http://education.[facility].com/faq/FAQ164)    HDL  Date Value Ref Range Status  12/24/2020 39 (L) > OR = 50 mg/dL Final  11/09/2018 42 >39 mg/dL Final   Triglycerides  Date Value Ref Range Status  12/24/2020 308 (H) <150 mg/dL Final    Comment:    . If a non-fasting specimen was collected, consider repeat triglyceride testing on a fasting specimen if clinically indicated.  Yates Decamp et al. J. of Clin. Lipidol. 4098;1:191-478. Marland Kitchen          Passed - TSH in normal range and within 360 days    TSH  Date Value Ref Range Status  12/24/2020 2.75 0.40 - 4.50 mIU/L Final         Passed - Completed PHQ-2 or PHQ-9 in the last 360 days      Passed - Last BP in normal range    BP Readings from Last 1 Encounters:  08/06/21 115/73         Passed - Last Heart Rate in normal range    Pulse Readings from Last 1 Encounters:  08/06/21 (!) 48         Passed - Valid encounter within last 6 months    Recent Outpatient Visits           4  months ago Primary hypertension   [facility] Tally Joe T, FNP   5 months ago Primary hypertension   [facility] Tally Joe T, FNP   8 months ago Acute cough   [facility] Gwyneth Sprout, FNP   9 months ago [place] Gwyneth Sprout, FNP   11 months ago Wellness examination   [facility] Tally Joe T, FNP       Future Appointments             In 1 week Gwyneth Sprout, [place], [facility]            Passed - CBC within normal limits and completed in the last 12 months    WBC  Date Value Ref Range Status  12/24/2020 7.5 3.8 - 10.8 Thousand/uL Final   RBC  Date Value Ref Range Status  12/24/2020 4.84 3.80 - 5.10 Million/uL Final   Hemoglobin  Date Value Ref Range Status  12/24/2020 14.0 11.7 - 15.5 g/dL Final  11/09/2018 13.9 11.1 - 15.9 g/dL Final   HCT  Date Value Ref Range Status  12/24/2020 42.2 35.0 - 45.0 % Final   Hematocrit  Date Value Ref Range Status  11/09/2018 40.0 34.0 - 46.6 % Final   MCHC  Date Value Ref Range Status  12/24/2020 33.2 32.0 - 36.0 g/dL Final   [facility]  Date Value Ref Range Status  12/24/2020 28.9 27.0 - 33.0 pg Final   MCV  Date Value Ref Range Status  12/24/2020 87.2 80.0 - 100.0 fL Final  11/09/2018 88 79 - 97 fL Final   No results found for: "PLTCOUNTKUC", "LABPLAT", "POCPLA" RDW  Date Value Ref Range Status  12/24/2020 13.7 11.0 - 15.0 % Final  11/09/2018 14.0 11.7 - 15.4 % Final         Passed - CMP within normal limits and completed in the last 12 months    Albumin  Date Value Ref Range Status  11/09/2018 4.1 3.8 - 4.8 g/dL Final   Alkaline Phosphatase  Date Value Ref Range Status  11/09/2018 77 39 - 117 IU/L Final   Alkaline phosphatase (APISO)  Date Value Ref Range Status  12/24/2020 86 37 - 153 U/L Final   ALT  Date Value Ref Range Status  12/24/2020 8 6 - 29 U/L Final   AST  Date Value Ref Range  Status  12/24/2020 14 10 - 35 U/L Final   BUN  Date Value Ref Range Status  12/24/2020 11 7 - 25 mg/dL Final  11/09/2018 10 8 - 27 mg/dL Final   Calcium  Date Value Ref Range Status  12/24/2020 11.2 (H) 8.6 - 10.4 mg/dL Final   CO2  Date Value Ref Range Status  12/24/2020 27 20 - 32 mmol/L Final   Creat  Date Value Ref Range Status  12/24/2020 1.57 (H) 0.50 - 1.05 mg/dL Final   Glucose  Date Value Ref Range Status  02/27/2016 111 mg/dL Final   Glucose, Bld  Date Value Ref Range Status  12/24/2020 92 65 - 99 mg/dL Final    Comment:    .            Fasting reference interval .    Potassium  Date Value Ref Range Status  12/24/2020 3.1 (L) 3.5 - 5.3 mmol/L Final   Sodium  Date Value Ref Range Status  12/24/2020 140 135 - 146 mmol/L Final  11/09/2018 139 134 - 144 mmol/L Final   Total Bilirubin  Date Value Ref Range Status  12/24/2020 0.6 0.2 - 1.2 mg/dL Final   Bilirubin Total  Date Value Ref Range Status  11/09/2018 0.4 0.0 - 1.2 mg/dL Final   Protein, ur  Date Value Ref Range Status  02/16/2019 100 (A) NEGATIVE mg/dL Final   Total Protein  Date Value Ref Range Status  12/24/2020 7.1 6.1 - 8.1 g/dL Final  11/09/2018 6.4 6.0 - 8.5 g/dL Final   GFR calc Af Amer  Date Value Ref Range Status  02/16/2019 39 (L) >60 mL/min Final   GFR calc non Af Amer  Date Value Ref Range Status  02/16/2019 34 (L) >60 mL/min Final

## 2021-12-17 NOTE — Progress Notes (Signed)
Complete physical exam   Patient: Stephanie Ball   DOB: 1955/05/08   66 y.o. Female  MRN: [id number] Visit Date: 12/19/2021  Today's healthcare provider: Gwyneth Sprout, FNP  Re Introduced to nurse practitioner role and practice setting.  All questions answered.  Discussed provider/patient relationship and expectations.  I,Tiffany J Bragg,acting as a scribe for Gwyneth Sprout, FNP.,have documented all relevant documentation on the behalf of Gwyneth Sprout, FNP,as directed by  Gwyneth Sprout, FNP while in the presence of Gwyneth Sprout, FNP.  Chief Complaint  Patient presents with   Annual Exam   Subjective    Stephanie Ball is a 66 y.o. female who presents today for a complete physical exam.  She reports consuming a general diet. The patient does not participate in regular exercise at present. She generally feels well. She reports sleeping well. She does not have additional problems to discuss today.  HPI   Past Medical History:  Diagnosis Date   Bipolar disorder ([place]) 08/24/2008   Chronic kidney disease    Depressive disorder 08/24/2008   Disorder of kidney and ureter 07/19/2009   Fibrocystic breast disease    Hematuria 08/24/2008   Hydronephrosis of left kidney    Hypercalcemia 07/19/2009   Hyperglycemia    Hyperlipidemia 08/24/2008   Hypertension 08/24/2008   essential, benign   Hypokalemia    Panic attack    Pure hypercholesterolemia 09/28//2010   Past Surgical History:  Procedure Laterality Date   ABDOMINAL HYSTERECTOMY     abdominal:due to fibroid and endometriosis. No history of abnormal paps. Ovaries removed also.   BREAST BIOPSY Right 2012   FIBROEPITHEIAL LESION WITH FLORID DUCTAL   CHOLECYSTECTOMY     no further information given   KIDNEY SURGERY     as stated pt had kidney surgery with no further given   OOPHORECTOMY Bilateral 1996   Dr, DeFrancisco   TUBAL LIGATION     Social History   Socioeconomic History   Marital status: Married    Spouse  name: Not on file   Number of children: Not on file   Years of education: Not on file   Highest education level: Not on file  Occupational History   Not on file  Tobacco Use   Smoking status: Every Day    Packs/day: 0.50    Types: Cigarettes   Smokeless tobacco: Never   Tobacco comments:    Smoking 1/4-1/2 ppd  Vaping Use   Vaping Use: Never used  Substance and Sexual Activity   Alcohol use: No   Drug use: No   Sexual activity: Not on file  Other Topics Concern   Not on file  Social History Narrative   Not on file   Social Determinants of Health   Financial Resource Strain: Not on file  Food Insecurity: Not on file  Transportation Needs: Not on file  Physical Activity: Not on file  Stress: Not on file  Social Connections: Not on file  Intimate Partner Violence: Not on file   Family Status  Relation Name Status   Mother  Deceased       Cerebral aneurysm   Father  Deceased   MGM  Deceased       brain Aneurysm   PGM  Deceased   Sister  Alive   Daughter  Alive   Sister  Alive   Neg Hx  (Not Specified)   Family History  Problem Relation Age of Onset   Anuerysm Mother  Diabetes Father    Cancer Paternal Grandmother        skin   Healthy Sister    Healthy Daughter    Healthy Sister    Breast cancer Neg Hx    Allergies  Allergen Reactions   Aciphex  [Rabeprazole Sodium]    Amoxicillin-Pot Clavulanate    Iron    Macrobid  [Nitrofurantoin Monohyd Macro]     Patient Care Team: Gwyneth Sprout, FNP as PCP - General (Family Medicine)   Medications: Outpatient Medications Prior to Visit  Medication Sig   amLODipine (NORVASC) 5 MG tablet TAKE 1 TABLET (5 MG TOTAL) BY MOUTH DAILY.   aspirin EC 81 MG tablet Take 81 mg by mouth daily.   busPIRone (BUSPAR) 30 MG tablet Take 1 tablet (30 mg total) by mouth 2 (two) times daily.   clotrimazole-betamethasone (LOTRISONE) cream APPLY TOPICALLY 2 TIMES DAILY   colchicine 0.6 MG tablet Take 2 tabs PO at onset of  symptoms, and one tab PO daily until symptoms resolve. Repeat as needed   famotidine (PEPCID) 20 MG tablet Take 20 mg by mouth 2 (two) times daily.   hydrochlorothiazide (HYDRODIURIL) 12.5 MG tablet TAKE 1 TABLET BY MOUTH EVERY DAY   hydrOXYzine (ATARAX) 25 MG tablet TAKE 1 TABLET BY MOUTH THREE TIMES A DAY AS NEEDED   LINZESS 290 MCG CAPS capsule TAKE 1 CAPSULE BY MOUTH EVERY DAY BEFORE BREAKFAST   lisinopril (ZESTRIL) 40 MG tablet TAKE 1 TABLET BY MOUTH EVERY DAY   loratadine (CLARITIN) 10 MG tablet Take 10 mg by mouth daily.   montelukast (SINGULAIR) 10 MG tablet TAKE 1 TABLET BY MOUTH EVERY DAY   OLANZapine (ZYPREXA) 15 MG tablet TAKE 1 TABLET (15 MG TOTAL) BY MOUTH DAILY.   rosuvastatin (CRESTOR) 20 MG tablet TAKE 1 TABLET BY MOUTH EVERY DAY   traZODone (DESYREL) 100 MG tablet TAKE 1 TABLET (100 MG TOTAL) BY MOUTH AT BEDTIME AS NEEDED FOR FOR SLEEP.   venlafaxine (EFFEXOR) 75 MG tablet TAKE 1 TABLET (75 MG TOTAL) BY MOUTH 3 (THREE) TIMES DAILY WITH MEALS.   Vitamin D, Ergocalciferol, (DRISDOL) 1.25 MG (50000 UNIT) CAPS capsule Take 1 capsule (50,000 Units total) by mouth every 7 (seven) days.   zolpidem (AMBIEN) 10 MG tablet TAKE 1 TABLET BY MOUTH EVERY DAY AT BEDTIME AS NEEDED FOR SLEEP   [DISCONTINUED] propranolol (INDERAL) 40 MG tablet TAKE 1 TABLET BY MOUTH TWICE A DAY   No facility-administered medications prior to visit.   Review of Systems  Respiratory:  Positive for shortness of breath.   Neurological:  Positive for dizziness.   Last CBC Lab Results  Component Value Date   WBC 7.5 12/24/2020   HGB 14.0 12/24/2020   HCT 42.2 12/24/2020   MCV 87.2 12/24/2020   MCH 28.9 12/24/2020   RDW 13.7 12/24/2020   PLT 311 12/15/4816   Last metabolic panel Lab Results  Component Value Date   GLUCOSE 92 12/24/2020   NA 140 12/24/2020   K 3.1 (L) 12/24/2020   CL 104 12/24/2020   CO2 27 12/24/2020   BUN 11 12/24/2020   CREATININE 1.57 (H) 12/24/2020   GFRNONAA 34 (L) 02/16/2019    CALCIUM 11.2 (H) 12/24/2020   PROT 7.1 12/24/2020   ALBUMIN 4.1 11/09/2018   LABGLOB 2.3 11/09/2018   AGRATIO 1.8 11/09/2018   BILITOT 0.6 12/24/2020   ALKPHOS 77 11/09/2018   AST 14 12/24/2020   ALT 8 12/24/2020   ANIONGAP 9 02/16/2019   Last lipids  Lab Results  Component Value Date   CHOL 143 10/07/2021   HDL 40 10/07/2021   LDLCALC 70 10/07/2021   TRIG 308 (H) 12/24/2020   CHOLHDL 4.6 12/24/2020   Last hemoglobin A1c Lab Results  Component Value Date   HGBA1C 5 10/07/2021   Last thyroid functions Lab Results  Component Value Date   TSH 2.75 12/24/2020   Last vitamin D Lab Results  Component Value Date   VD25OH 129 (H) 12/24/2020   Last vitamin B12 and Folate Lab Results  Component Value Date   VITAMINB12 148 (L) 12/24/2020    Objective     BP 100/65 (BP Location: Right Arm, Patient Position: Sitting, Cuff Size: Normal)   Pulse (!) 47   Temp 97.7 F (36.5 C) (Oral)   Resp 16   Ht '5\' 2"'$  (1.575 m)   Wt 134 lb (60.8 kg)   SpO2 96%   BMI 24.51 kg/m   BP Readings from Last 3 Encounters:  12/19/21 100/65  08/06/21 115/73  07/07/21 (!) 174/89   Wt Readings from Last 3 Encounters:  12/19/21 134 lb (60.8 kg)  08/06/21 135 lb 14.4 oz (61.6 kg)  07/07/21 137 lb (62.1 kg)   SpO2 Readings from Last 3 Encounters:  12/19/21 96%  08/06/21 98%  07/07/21 100%   Physical Exam Vitals and nursing note reviewed.  Constitutional:      General: She is awake. She is not in acute distress.    Appearance: Normal appearance. She is well-developed, well-groomed and normal weight. She is not ill-appearing, toxic-appearing or diaphoretic.  HENT:     Head: Normocephalic and atraumatic.     Jaw: There is normal jaw occlusion. No trismus, tenderness, swelling or pain on movement.     Right Ear: Hearing, tympanic membrane, ear canal and external ear normal. There is no impacted cerumen.     Left Ear: Hearing, tympanic membrane, ear canal and external ear normal. There  is no impacted cerumen.     Nose: Nose normal. No congestion or rhinorrhea.     Right Turbinates: Not enlarged, swollen or pale.     Left Turbinates: Not enlarged, swollen or pale.     Right Sinus: No maxillary sinus tenderness or frontal sinus tenderness.     Left Sinus: No maxillary sinus tenderness or frontal sinus tenderness.     Mouth/Throat:     Lips: Pink.     Mouth: Mucous membranes are moist. No injury.     Tongue: No lesions.     Pharynx: Oropharynx is clear. Uvula midline. No pharyngeal swelling, oropharyngeal exudate, posterior oropharyngeal erythema or uvula swelling.     Tonsils: No tonsillar exudate or tonsillar abscesses.  Eyes:     General: Lids are normal. Lids are everted, no foreign bodies appreciated. Vision grossly intact. Gaze aligned appropriately. No allergic shiner or visual field deficit.       Right eye: No discharge.        Left eye: No discharge.     Extraocular Movements: Extraocular movements intact.     Conjunctiva/sclera: Conjunctivae normal.     Right eye: Right conjunctiva is not injected. No exudate.    Left eye: Left conjunctiva is not injected. No exudate.    Pupils: Pupils are equal, round, and reactive to light.  Neck:     Thyroid: No thyroid mass, thyromegaly or thyroid tenderness.     Vascular: No carotid bruit.     Trachea: Trachea normal.  Cardiovascular:     Rate  and Rhythm: Regular rhythm. Bradycardia present.     Pulses: Normal pulses.          Carotid pulses are 2+ on the right side and 2+ on the left side.      Radial pulses are 2+ on the right side and 2+ on the left side.       Dorsalis pedis pulses are 2+ on the right side and 2+ on the left side.       Posterior tibial pulses are 2+ on the right side and 2+ on the left side.     Heart sounds: Normal heart sounds, S1 normal and S2 normal. No murmur heard.    No friction rub. No gallop.  Pulmonary:     Effort: Pulmonary effort is normal. No respiratory distress.     Breath  sounds: Normal breath sounds and air entry. No stridor. No wheezing, rhonchi or rales.  Chest:     Chest wall: No tenderness.     Comments: Breasts: risk and benefit of breast self-exam was discussed, not examined  Abdominal:     General: Abdomen is flat. Bowel sounds are normal. There is no distension.     Palpations: Abdomen is soft. There is no mass.     Tenderness: There is no abdominal tenderness. There is no right CVA tenderness, left CVA tenderness, guarding or rebound.     Hernia: No hernia is present.  Genitourinary:    Comments: Exam deferred; denies complaints Musculoskeletal:        General: No swelling, tenderness, deformity or signs of injury. Normal range of motion.     Cervical back: Full passive range of motion without pain, normal range of motion and neck supple. No edema, rigidity or tenderness. No muscular tenderness.     Right lower leg: No edema.     Left lower leg: No edema.  Lymphadenopathy:     Cervical: No cervical adenopathy.     Right cervical: No superficial, deep or posterior cervical adenopathy.    Left cervical: No superficial, deep or posterior cervical adenopathy.  Skin:    General: Skin is warm and dry.     Capillary Refill: Capillary refill takes less than 2 seconds.     Coloration: Skin is not jaundiced or pale.     Findings: No bruising, erythema, lesion or rash.  Neurological:     General: No focal deficit present.     Mental Status: She is alert and oriented to person, place, and time. Mental status is at baseline.     GCS: GCS eye subscore is 4. GCS verbal subscore is 5. GCS motor subscore is 6.     Sensory: Sensation is intact. No sensory deficit.     Motor: Motor function is intact. No weakness.     Coordination: Coordination is intact. Coordination normal.     Gait: Gait is intact. Gait normal.  Psychiatric:        Attention and Perception: Attention and perception normal.        Mood and Affect: Mood and affect normal.        Speech:  Speech normal.        Behavior: Behavior normal. Behavior is cooperative.        Thought Content: Thought content normal.        Cognition and Memory: Cognition and memory normal.        Judgment: Judgment normal.      Last depression screening scores    12/19/2021  8:40 AM 07/07/2021    9:08 AM 04/08/2021    9:32 AM  PHQ 2/9 Scores  PHQ - 2 Score 0 2 0  PHQ- 9 Score 0 5 2   Last fall risk screening    12/19/2021    8:40 AM  Luck in the past year? 0  Number falls in past yr: 0  Injury with Fall? 0   Last Audit-C alcohol use screening    12/19/2021    8:41 AM  Alcohol Use Disorder Test (AUDIT)  1. How often do you have a drink containing alcohol? 0  2. How many drinks containing alcohol do you have on a typical day when you are drinking? 0  3. How often do you have six or more drinks on one occasion? 0  AUDIT-C Score 0   A score of 3 or more in women, and 4 or more in men indicates increased risk for alcohol abuse, EXCEPT if all of the points are from question 1   Results for orders placed or performed in visit on 12/19/21  Lipid panel  Result Value Ref Range   LDl/HDL Ratio 3.6    Cholesterol 143 0 - 200   HDL 40 35 - 70   LDL Cholesterol 70   Hemoglobin A1c  Result Value Ref Range   Hemoglobin A1C 5     Assessment & Plan    Routine Health Maintenance and Physical Exam  Exercise Activities and Dietary recommendations  Goals   None     Immunization History  Administered Date(s) Administered   Influenza Split 02/13/2009, 01/22/2010, 01/14/2011   Influenza,inj,Quad PF,6+ Mos 01/20/2013, 03/09/2014, 01/05/2017, 01/27/2019   Influenza-Unspecified 03/01/2015, 02/15/2018, 01/24/2021   PFIZER(Purple Top)SARS-COV-2 Vaccination 07/28/2019, 08/23/2019   Pneumococcal Conjugate-13 01/27/2019   Td 12/02/2016   Tdap 09/03/2005   Zoster Recombinat (Shingrix) 12/24/2020, 03/05/2021    Health Maintenance  Topic Date Due   COVID-19 Vaccine (3 - Pfizer  series) 10/18/2019   INFLUENZA VACCINE  12/02/2021   MAMMOGRAM  08/19/2022   Fecal DNA (Cologuard)  11/22/2022   PAP SMEAR-Modifier  08/06/2024   TETANUS/TDAP  12/03/2026   Hepatitis C Screening  Completed   HIV Screening  Completed   Zoster Vaccines- Shingrix  Completed   HPV VACCINES  Aged Out   COLON CANCER SCREENING ANNUAL FOBT  Discontinued    Discussed health benefits of physical activity, and encouraged her to engage in regular exercise appropriate for her age and condition.  Problem List Items Addressed This Visit       Other   Annual physical exam - Primary    Things to do to keep yourself healthy  - Exercise at least 30-45 minutes a day, 3-4 days a week.  - Eat a low-fat diet with lots of fruits and vegetables, up to 7-9 servings per day.  - Seatbelts can save your life. Wear them always.  - Smoke detectors on every level of your home, check batteries every year.  - Eye Doctor - have an eye exam every 1-2 years  - Safe sex - if you may be exposed to STDs, use a condom.  - Alcohol -  If you drink, do it moderately, less than 2 drinks per day.  - [place]. Choose someone to speak for you if you are not able.  - Depression is common in our stressful world.If you're feeling down or losing interest in things you normally enjoy, please come in for a  visit.  - Violence - If anyone is threatening or hurting you, please call immediately.        Bradycardia    Acute on chronic, with associated SOB/DOE and occasional dizziness EKG taken; no comparison NSB- HR 40s; see scanned copy Titration of propranolol to assist from 40 mg BID, pt using 40 mg QD to 20 mg QD PRN; pt reports adequate quantity on hand      Relevant Orders   EKG 12-Lead   RESOLVED: Panic attacks   Relevant Medications   propranolol (INDERAL) 20 MG tablet     Return in about 6 months (around 06/21/2022) for chonic disease management.     Vonna Kotyk, FNP, have reviewed all  documentation for this visit. The documentation on 12/19/21 for the exam, diagnosis, procedures, and orders are all accurate and complete.    Gwyneth Sprout, [place] [phone] (phone) [phone] (fax)  [place]

## 2021-12-18 ENCOUNTER — Other Ambulatory Visit: Payer: Self-pay | Admitting: Family Medicine

## 2021-12-18 DIAGNOSIS — I10 Essential (primary) hypertension: Secondary | ICD-10-CM

## 2021-12-19 ENCOUNTER — Ambulatory Visit (INDEPENDENT_AMBULATORY_CARE_PROVIDER_SITE_OTHER): Payer: No Typology Code available for payment source | Admitting: Family Medicine

## 2021-12-19 ENCOUNTER — Encounter: Payer: Self-pay | Admitting: Family Medicine

## 2021-12-19 VITALS — BP 100/65 | HR 47 | Temp 97.7°F | Resp 16 | Ht 62.0 in | Wt 134.0 lb

## 2021-12-19 DIAGNOSIS — F410 Panic disorder [episodic paroxysmal anxiety] without agoraphobia: Secondary | ICD-10-CM | POA: Diagnosis not present

## 2021-12-19 DIAGNOSIS — R001 Bradycardia, unspecified: Secondary | ICD-10-CM | POA: Diagnosis not present

## 2021-12-19 DIAGNOSIS — Z0000 Encounter for general adult medical examination without abnormal findings: Secondary | ICD-10-CM | POA: Insufficient documentation

## 2021-12-19 MED ORDER — PROPRANOLOL HCL 20 MG PO TABS: 20.0000 mg | ORAL_TABLET | Freq: Two times a day (BID) | ORAL | 0 refills | Status: DC

## 2021-12-19 MED ORDER — PROPRANOLOL HCL 20 MG PO TABS: 20.0000 mg | ORAL_TABLET | Freq: Every day | ORAL | 0 refills | Status: DC | PRN

## 2021-12-19 NOTE — Assessment & Plan Note (Signed)
Acute on chronic, with associated SOB/DOE and occasional dizziness EKG taken; no comparison NSB- HR 40s; see scanned copy Titration of propranolol to assist from 40 mg BID, pt using 40 mg QD to 20 mg QD PRN; pt reports adequate quantity on hand

## 2021-12-19 NOTE — Assessment & Plan Note (Signed)

## 2021-12-30 ENCOUNTER — Other Ambulatory Visit: Payer: Self-pay | Admitting: Family Medicine

## 2021-12-30 DIAGNOSIS — F410 Panic disorder [episodic paroxysmal anxiety] without agoraphobia: Secondary | ICD-10-CM

## 2022-01-08 ENCOUNTER — Other Ambulatory Visit: Payer: Self-pay | Admitting: Family Medicine

## 2022-01-08 DIAGNOSIS — F3170 Bipolar disorder, currently in remission, most recent episode unspecified: Secondary | ICD-10-CM

## 2022-01-12 ENCOUNTER — Other Ambulatory Visit: Payer: Self-pay | Admitting: Family Medicine

## 2022-01-12 NOTE — Telephone Encounter (Signed)
Medication Refill - Medication: clotrimazole-betamethasone (LOTRISONE) cream [[id number]]   Has the patient contacted their pharmacy? Yes.   (Agent: If no, request that the patient contact the pharmacy for the refill. If patient does not wish to contact the pharmacy document the reason why and proceed with request.) (Agent: If yes, when and what did the pharmacy advise?) RX was old and out of the pharmacy systems / call pcp  Preferred Pharmacy (with phone number or street name): [id number] [place] [address] [id number] Phone:  [phone] Fax:  [phone] DEA #:  [id number] the patient been seen for an appointment in the last year OR does the patient have an upcoming appointment? Yes.    Agent: Please be advised that RX refills may take up to 3 business days. We ask that you follow-up with your pharmacy.

## 2022-01-14 NOTE — Telephone Encounter (Signed)
Requested medication (s) are due for refill today: yes  Requested medication (s) are on the active medication list: yes  Last refill:  07/05/2018 30 g with 5 RF  Future visit scheduled: no, just seen 12/19/21  Notes to clinic:  Prescriber not in this practice, please assess.       Requested Prescriptions  Pending Prescriptions Disp Refills   clotrimazole-betamethasone (LOTRISONE) cream 30 g 5     Off-Protocol Failed - 01/12/2022  3:37 PM      Failed - Medication not assigned to a protocol, review manually.      Passed - Valid encounter within last 12 months    Recent Outpatient Visits           3 weeks ago Annual physical exam   [facility] Gwyneth Sprout, FNP   5 months ago Primary hypertension   [facility] Gwyneth Sprout, FNP   6 months ago Primary hypertension   [facility] Gwyneth Sprout, FNP   9 months ago Acute cough   [facility] Gwyneth Sprout, FNP   10 months ago [place] Gwyneth Sprout, FNP

## 2022-02-04 ENCOUNTER — Other Ambulatory Visit: Payer: Self-pay | Admitting: Family Medicine

## 2022-02-04 DIAGNOSIS — I10 Essential (primary) hypertension: Secondary | ICD-10-CM

## 2022-02-04 DIAGNOSIS — F410 Panic disorder [episodic paroxysmal anxiety] without agoraphobia: Secondary | ICD-10-CM

## 2022-02-04 DIAGNOSIS — F5101 Primary insomnia: Secondary | ICD-10-CM

## 2022-03-06 ENCOUNTER — Other Ambulatory Visit: Payer: Self-pay | Admitting: Family Medicine

## 2022-03-06 DIAGNOSIS — F5101 Primary insomnia: Secondary | ICD-10-CM

## 2022-03-06 NOTE — Telephone Encounter (Signed)
Requested Prescriptions  Pending Prescriptions Disp Refills   traZODone (DESYREL) 100 MG tablet [Pharmacy Med Name: TRAZODONE 100 MG TABLET] 90 tablet 0    Sig: TAKE 1 TABLET (100 MG TOTAL) BY MOUTH AT BEDTIME AS NEEDED FOR FOR SLEEP.     Psychiatry: Antidepressants - Serotonin Modulator Passed - 03/06/2022  2:07 AM      Passed - Completed PHQ-2 or PHQ-9 in the last 360 days      Passed - Valid encounter within last 6 months    Recent Outpatient Visits           2 months ago Annual physical exam   [facility] Gwyneth Sprout, FNP   7 months ago Primary hypertension   [facility] Gwyneth Sprout, FNP   8 months ago Primary hypertension   [facility] Gwyneth Sprout, FNP   11 months ago Acute cough   [facility] Gwyneth Sprout, FNP   1 year ago [place], Elise T, FNP

## 2022-03-17 ENCOUNTER — Other Ambulatory Visit: Payer: Self-pay | Admitting: Family Medicine

## 2022-03-17 DIAGNOSIS — I10 Essential (primary) hypertension: Secondary | ICD-10-CM

## 2022-03-24 ENCOUNTER — Other Ambulatory Visit: Payer: Self-pay | Admitting: Family Medicine

## 2022-03-24 DIAGNOSIS — F419 Anxiety disorder, unspecified: Secondary | ICD-10-CM

## 2022-03-24 NOTE — Telephone Encounter (Signed)
Requested Prescriptions  Pending Prescriptions Disp Refills   busPIRone (BUSPAR) 30 MG tablet [Pharmacy Med Name: BUSPIRONE HCL 30 MG TABLET] 180 tablet 3    Sig: TAKE 1 TABLET BY MOUTH 2 TIMES DAILY.     Psychiatry: Anxiolytics/Hypnotics - Non-controlled Passed - 03/24/2022  2:11 AM      Passed - Valid encounter within last 12 months    Recent Outpatient Visits           3 months ago Annual physical exam   [facility] Gwyneth Sprout, FNP   7 months ago Primary hypertension   [facility] Gwyneth Sprout, FNP   8 months ago Primary hypertension   [facility] Gwyneth Sprout, FNP   11 months ago Acute cough   [facility] Gwyneth Sprout, FNP   1 year ago [place], Elise T, FNP

## 2022-04-02 ENCOUNTER — Other Ambulatory Visit: Payer: Self-pay | Admitting: Family Medicine

## 2022-04-02 DIAGNOSIS — F410 Panic disorder [episodic paroxysmal anxiety] without agoraphobia: Secondary | ICD-10-CM

## 2022-04-02 DIAGNOSIS — F5101 Primary insomnia: Secondary | ICD-10-CM

## 2022-04-30 ENCOUNTER — Other Ambulatory Visit: Payer: Self-pay | Admitting: Family Medicine

## 2022-04-30 DIAGNOSIS — F3170 Bipolar disorder, currently in remission, most recent episode unspecified: Secondary | ICD-10-CM

## 2022-04-30 DIAGNOSIS — F5101 Primary insomnia: Secondary | ICD-10-CM

## 2022-04-30 MED ORDER — ZOLPIDEM TARTRATE 10 MG PO TABS: ORAL_TABLET | ORAL | 0 refills | Status: DC

## 2022-05-01 MED ORDER — VENLAFAXINE HCL 75 MG PO TABS: 75.0000 mg | ORAL_TABLET | Freq: Three times a day (TID) | ORAL | 0 refills | Status: DC

## 2022-05-28 ENCOUNTER — Other Ambulatory Visit: Payer: Self-pay | Admitting: Family Medicine

## 2022-05-28 DIAGNOSIS — F5101 Primary insomnia: Secondary | ICD-10-CM

## 2022-05-28 NOTE — Telephone Encounter (Signed)
Requested medication (s) are due for refill today: yes  Requested medication (s) are on the active medication list: yes  Last refill:  06/09/21 #13 3 refills  Future visit scheduled: no   Notes to clinic:  manuel review needed. Do you want to refill Rx?     Requested Prescriptions  Pending Prescriptions Disp Refills   Vitamin D, Ergocalciferol, (DRISDOL) 1.25 MG (50000 UNIT) CAPS capsule [Pharmacy Med Name: VITAMIN D2 1.'25MG'$ (50,000 UNIT)] 13 capsule 3    Sig: TAKE 1 CAPSULE (50,000 UNITS TOTAL) BY MOUTH EVERY 7 (SEVEN) DAYS     Endocrinology:  Vitamins - Vitamin D Supplementation 2 Failed - 05/28/2022  7:26 AM      Failed - Manual Review: Route requests for 50,000 IU strength to the provider      Failed - Ca in normal range and within 360 days    Calcium  Date Value Ref Range Status  12/24/2020 11.2 (H) 8.6 - 10.4 mg/dL Final         Failed - Vitamin D in normal range and within 360 days    Vit D, 25-Hydroxy  Date Value Ref Range Status  12/24/2020 129 (H) 30 - 100 ng/mL Final    Comment:    Vitamin D Status         25-OH Vitamin D: . Deficiency:                    <20 ng/mL Insufficiency:             20 - 29 ng/mL Optimal:                 > or = 30 ng/mL . For 25-OH Vitamin D testing on patients on  D2-supplementation and patients for whom quantitation  of D2 and D3 fractions is required, the QuestAssureD(TM) 25-OH VIT D, (D2,D3), LC/MS/MS is recommended: order  code [id number] (patients >105yr). See Note 1 . Note 1 . For additional information, please refer to  http://education.QuestDiagnostics.com/faq/FAQ199  (This link is being provided for informational/ educational purposes only.)          Passed - Valid encounter within last 12 months    Recent Outpatient Visits           5 months ago Annual physical exam   [place]   9 months ago Primary hypertension   [place]  F[place]   10 months ago Primary hypertension   [place]   1 year ago Acute cough   [place] [place] F[place]   1 year ago [place], FNP              Signed Prescriptions Disp Refills   traZODone (DESYREL) 100 MG tablet 180 tablet 1    Sig: TAKE 2 TABLETS (200 MG TOTAL) BY MOUTH AT BEDTIME AS NEEDED FOR SLEEP.     Psychiatry: Antidepressants - Serotonin Modulator Passed - 05/28/2022  7:26 AM      Passed - Completed PHQ-2 or PHQ-9 in the last 360 days      Passed - Valid encounter within last 6 months    Recent Outpatient Visits           5 months ago Annual physical exam   [place]  9 months ago Primary hypertension   [place] Gwyneth Sprout, FNP   10 months ago Primary hypertension   [place] Tally Joe T, FNP   1 year ago Acute cough   [place] Gwyneth Sprout, FNP   1 year ago [place], Elise T, [place]

## 2022-05-28 NOTE — Telephone Encounter (Signed)
Requested Prescriptions  Pending Prescriptions Disp Refills   Vitamin D, Ergocalciferol, (DRISDOL) 1.25 MG (50000 UNIT) CAPS capsule [Pharmacy Med Name: VITAMIN D2 1.'25MG'$ (50,000 UNIT)] 13 capsule 3    Sig: TAKE 1 CAPSULE (50,000 UNITS TOTAL) BY MOUTH EVERY 7 (SEVEN) DAYS     Endocrinology:  Vitamins - Vitamin D Supplementation 2 Failed - 05/28/2022  7:26 AM      Failed - Manual Review: Route requests for 50,000 IU strength to the provider      Failed - Ca in normal range and within 360 days    Calcium  Date Value Ref Range Status  12/24/2020 11.2 (H) 8.6 - 10.4 mg/dL Final         Failed - Vitamin D in normal range and within 360 days    Vit D, 25-Hydroxy  Date Value Ref Range Status  12/24/2020 129 (H) 30 - 100 ng/mL Final    Comment:    Vitamin D Status         25-OH Vitamin D: . Deficiency:                    <20 ng/mL Insufficiency:             20 - 29 ng/mL Optimal:                 > or = 30 ng/mL . For 25-OH Vitamin D testing on patients on  D2-supplementation and patients for whom quantitation  of D2 and D3 fractions is required, the QuestAssureD(TM) 25-OH VIT D, (D2,D3), LC/MS/MS is recommended: order  code [id number] [phone] (patients >24yr). See Note 1 . Note 1 . For additional information, please refer to  http://education.Quest[facility].com/faq/FAQ199  (This link is being provided for informational/ educational purposes only.)          Passed - Valid encounter within last 12 months    Recent Outpatient Visits           5 months ago Annual physical exam   [place]   9 months ago Primary hypertension   [place]   10 months ago Primary hypertension   [place] [place] F[place]   1 year ago Acute cough   [place] F[place]   1 year ago [place], F[place]               traZODone (DESYREL) 100 MG tablet [Pharmacy Med Name: TRAZODONE 100 MG TABLET] 180 tablet 1    Sig: TAKE 2 TABLETS (200 MG TOTAL) BY MOUTH AT BEDTIME AS NEEDED FOR SLEEP.     Psychiatry: Antidepressants - Serotonin Modulator Passed - 05/28/2022  7:26 AM      Passed - Completed PHQ-2 or PHQ-9 in the last 360 days      Passed - Valid encounter within last 6 months    Recent Outpatient Visits           5 months ago Annual physical exam   [place] [place]   9 months ago Primary hypertension   [place] F[place]   10 months ago Primary hypertension   [place] F[place]   1 year ago Acute cough   [place], FNP   1 year ago [place] Gwyneth Sprout, [place]

## 2022-06-10 ENCOUNTER — Other Ambulatory Visit: Payer: Self-pay | Admitting: Family Medicine

## 2022-06-10 DIAGNOSIS — I10 Essential (primary) hypertension: Secondary | ICD-10-CM

## 2022-06-24 ENCOUNTER — Other Ambulatory Visit: Payer: Self-pay | Admitting: Family Medicine

## 2022-06-24 DIAGNOSIS — F410 Panic disorder [episodic paroxysmal anxiety] without agoraphobia: Secondary | ICD-10-CM

## 2022-07-21 ENCOUNTER — Other Ambulatory Visit: Payer: Self-pay | Admitting: Family Medicine

## 2022-07-21 DIAGNOSIS — F3170 Bipolar disorder, currently in remission, most recent episode unspecified: Secondary | ICD-10-CM

## 2022-07-21 DIAGNOSIS — F5101 Primary insomnia: Secondary | ICD-10-CM

## 2022-07-21 DIAGNOSIS — I10 Essential (primary) hypertension: Secondary | ICD-10-CM

## 2022-09-01 NOTE — Progress Notes (Signed)
Stephanie Ball,acting as a scribe for Jacky Kindle, FNP.,have documented all relevant documentation on the behalf of Jacky Kindle, FNP,as directed by  Jacky Kindle, FNP while in the presence of Jacky Kindle, FNP.  Complete physical exam  Patient: Stephanie Ball   DOB: 31-May-1955   67 y.o. Female  MRN: [id number] Visit Date: 09/02/2022  Today's healthcare provider: Jacky Kindle, FNP  Re Introduced to nurse practitioner role and practice setting.  All questions answered.  Discussed provider/patient relationship and expectations.  Chief Complaint  Patient presents with   Complete Physical Exam   Subjective    Stephanie Ball is a 67 y.o. female who presents today for a complete physical exam.  She reports consuming a general diet. Home exercise routine includes walking 5 hrs per week. She generally feels well. She reports sleeping well. She does not have additional problems to discuss today.  HPI   Care Gaps  Patient reports that mammogram has been scheduled.   Past Medical History:  Diagnosis Date   Bipolar disorder (HCC) 08/24/2008   Chronic kidney disease    Depressive disorder 08/24/2008   Disorder of kidney and ureter 07/19/2009   Fibrocystic breast disease    Hematuria 08/24/2008   Hydronephrosis of left kidney    Hypercalcemia 07/19/2009   Hyperglycemia    Hyperlipidemia 08/24/2008   Hypertension 08/24/2008   essential, benign   Hypokalemia    Panic attack    Pure hypercholesterolemia 09/28//2010   Past Surgical History:  Procedure Laterality Date   ABDOMINAL HYSTERECTOMY     abdominal:due to fibroid and endometriosis. No history of abnormal paps. Ovaries removed also.   BREAST BIOPSY Right 2012   FIBROEPITHEIAL LESION WITH FLORID DUCTAL   CHOLECYSTECTOMY     no further information given   KIDNEY SURGERY     as stated pt had kidney surgery with no further given   OOPHORECTOMY Bilateral 1996   Dr, DeFrancisco   TUBAL LIGATION     Social History    Socioeconomic History   Marital status: Married    Spouse name: Not on file   Number of children: Not on file   Years of education: Not on file   Highest education level: Not on file  Occupational History   Not on file  Tobacco Use   Smoking status: Former    Packs/day: 0.50    Years: 40.00    Additional pack years: 0.00    Total pack years: 20.00    Types: Cigarettes    Start date: 04/09/1983    Quit date: 08/28/2022    Years since quitting: 0.0   Smokeless tobacco: Never   Tobacco comments:    Quit cold [place] within the last week  Vaping Use   Vaping Use: Never used  Substance and Sexual Activity   Alcohol use: No   Drug use: No   Sexual activity: Not on file  Other Topics Concern   Not on file  Social History Narrative   Not on file   Social Determinants of Health   Financial Resource Strain: Not on file  Food Insecurity: Not on file  Transportation Needs: Not on file  Physical Activity: Not on file  Stress: Not on file  Social Connections: Not on file  Intimate Partner Violence: Not on file   Family Status  Relation Name Status   Mother  Deceased       Cerebral aneurysm   Father  Deceased   MGM  Deceased       brain Aneurysm   PGM  Deceased   Sister  Alive   Daughter  Alive   Sister  Alive   Neg Hx  (Not Specified)   Family History  Problem Relation Age of Onset   Anuerysm Mother    Diabetes Father    Cancer Paternal Grandmother        skin   Healthy Sister    Healthy Daughter    Healthy Sister    Breast cancer Neg Hx    Allergies  Allergen Reactions   Aciphex  [Rabeprazole Sodium]    Amoxicillin-Pot Clavulanate    Iron    Macrobid  [Nitrofurantoin Monohyd Macro]     Patient Care Team: Jacky Kindle, FNP as PCP - General (Family Medicine)   Medications: Outpatient Medications Prior to Visit  Medication Sig   amLODipine (NORVASC) 5 MG tablet TAKE 1 TABLET (5 MG TOTAL) BY MOUTH DAILY.   aspirin EC 81 MG tablet Take 81 mg by mouth  daily.   busPIRone (BUSPAR) 30 MG tablet TAKE 1 TABLET BY MOUTH 2 TIMES DAILY.   clotrimazole-betamethasone (LOTRISONE) cream APPLY TOPICALLY 2 TIMES DAILY   colchicine 0.6 MG tablet Take 2 tabs PO at onset of symptoms, and one tab PO daily until symptoms resolve. Repeat as needed   famotidine (PEPCID) 20 MG tablet Take 20 mg by mouth 2 (two) times daily.   hydrochlorothiazide (HYDRODIURIL) 12.5 MG tablet TAKE 1 TABLET BY MOUTH EVERY DAY   hydrOXYzine (ATARAX) 25 MG tablet TAKE 1 TABLET BY MOUTH THREE TIMES A DAY AS NEEDED   lisinopril (ZESTRIL) 40 MG tablet Take 1 tablet (40 mg total) by mouth daily.   montelukast (SINGULAIR) 10 MG tablet TAKE 1 TABLET BY MOUTH EVERY DAY   OLANZapine (ZYPREXA) 15 MG tablet TAKE 1 TABLET (15 MG TOTAL) BY MOUTH DAILY.   propranolol (INDERAL) 20 MG tablet Take 1 tablet (20 mg total) by mouth daily as needed.   rosuvastatin (CRESTOR) 20 MG tablet Take 1 tablet (20 mg total) by mouth daily.   traZODone (DESYREL) 100 MG tablet TAKE 2 TABLETS (200 MG TOTAL) BY MOUTH AT BEDTIME AS NEEDED FOR SLEEP.   venlafaxine (EFFEXOR) 75 MG tablet Take 1 tablet (75 mg total) by mouth 3 (three) times daily with meals.   Vitamin D, Ergocalciferol, (DRISDOL) 1.25 MG (50000 UNIT) CAPS capsule TAKE 1 CAPSULE (50,000 UNITS TOTAL) BY MOUTH EVERY 7 (SEVEN) DAYS   zolpidem (AMBIEN) 10 MG tablet Take 1 tablet (10 mg total) by mouth at bedtime as needed for sleep. Please schedule office visit before any future refill.   lovastatin (MEVACOR) 40 MG tablet Take 40 mg by mouth at bedtime.   [DISCONTINUED] LINZESS 290 MCG CAPS capsule TAKE 1 CAPSULE BY MOUTH EVERY DAY BEFORE BREAKFAST (Patient not taking: Reported on 09/02/2022)   No facility-administered medications prior to visit.    Review of Systems    Objective    BP 108/89 (BP Location: Right Arm, Patient Position: Sitting, Cuff Size: Normal)   Pulse 70   Temp 98.1 F (36.7 C) (Oral)   Resp 15   Ht 5\' 2"  (1.575 m)   Wt 146 lb 6.4  oz (66.4 kg)   SpO2 100%   BMI 26.78 kg/m     Physical Exam Vitals and nursing note reviewed.  Constitutional:      General: She is awake. She is not in acute distress.    Appearance: Normal appearance. She is well-developed,  well-groomed and overweight. She is not ill-appearing, toxic-appearing or diaphoretic.  HENT:     Head: Normocephalic and atraumatic.     Jaw: There is normal jaw occlusion. No trismus, tenderness, swelling or pain on movement.     Right Ear: Hearing, tympanic membrane, ear canal and external ear normal. There is no impacted cerumen.     Left Ear: Hearing, tympanic membrane, ear canal and external ear normal. There is no impacted cerumen.     Nose: Nose normal. No congestion or rhinorrhea.     Right Turbinates: Not enlarged, swollen or pale.     Left Turbinates: Not enlarged, swollen or pale.     Right Sinus: No maxillary sinus tenderness or frontal sinus tenderness.     Left Sinus: No maxillary sinus tenderness or frontal sinus tenderness.     Mouth/Throat:     Lips: Pink.     Mouth: Mucous membranes are moist. No injury.     Tongue: No lesions.     Pharynx: Oropharynx is clear. Uvula midline. No pharyngeal swelling, oropharyngeal exudate, posterior oropharyngeal erythema or uvula swelling.     Tonsils: No tonsillar exudate or tonsillar abscesses.  Eyes:     General: Lids are normal. Lids are everted, no foreign bodies appreciated. Vision grossly intact. Gaze aligned appropriately. No allergic shiner or visual field deficit.       Right eye: No discharge.        Left eye: No discharge.     Extraocular Movements: Extraocular movements intact.     Conjunctiva/sclera: Conjunctivae normal.     Right eye: Right conjunctiva is not injected. No exudate.    Left eye: Left conjunctiva is not injected. No exudate.    Pupils: Pupils are equal, round, and reactive to light.  Neck:     Thyroid: No thyroid mass, thyromegaly or thyroid tenderness.     Vascular: No  carotid bruit.     Trachea: Trachea normal.  Cardiovascular:     Rate and Rhythm: Normal rate and regular rhythm.     Pulses: Normal pulses.          Carotid pulses are 2+ on the right side and 2+ on the left side.      Radial pulses are 2+ on the right side and 2+ on the left side.       Dorsalis pedis pulses are 2+ on the right side and 2+ on the left side.       Posterior tibial pulses are 2+ on the right side and 2+ on the left side.     Heart sounds: Normal heart sounds, S1 normal and S2 normal. No murmur heard.    No friction rub. No gallop.  Pulmonary:     Effort: Pulmonary effort is normal. No respiratory distress.     Breath sounds: Normal breath sounds and air entry. No stridor. No wheezing, rhonchi or rales.  Chest:     Chest wall: No tenderness.  Abdominal:     General: Abdomen is flat. Bowel sounds are normal. There is no distension.     Palpations: Abdomen is soft. There is no mass.     Tenderness: There is no abdominal tenderness. There is no right CVA tenderness, left CVA tenderness, guarding or rebound.     Hernia: No hernia is present.  Genitourinary:    Comments: Exam deferred; denies complaints Musculoskeletal:        General: No swelling, tenderness, deformity or signs of injury. Normal range of motion.  Cervical back: Full passive range of motion without pain, normal range of motion and neck supple. No edema, rigidity or tenderness. No muscular tenderness.     Right lower leg: No edema.     Left lower leg: No edema.  Lymphadenopathy:     Cervical: No cervical adenopathy.     Right cervical: No superficial, deep or posterior cervical adenopathy.    Left cervical: No superficial, deep or posterior cervical adenopathy.  Skin:    General: Skin is warm and dry.     Capillary Refill: Capillary refill takes less than 2 seconds.     Coloration: Skin is not jaundiced or pale.     Findings: No bruising, erythema, lesion or rash.  Neurological:     General: No  focal deficit present.     Mental Status: She is alert and oriented to person, place, and time. Mental status is at baseline.     GCS: GCS eye subscore is 4. GCS verbal subscore is 5. GCS motor subscore is 6.     Sensory: Sensation is intact. No sensory deficit.     Motor: Motor function is intact. No weakness.     Coordination: Coordination is intact. Coordination normal.     Gait: Gait is intact. Gait normal.  Psychiatric:        Attention and Perception: Attention and perception normal.        Mood and Affect: Mood and affect normal.        Speech: Speech normal.        Behavior: Behavior normal. Behavior is cooperative.        Thought Content: Thought content normal.        Cognition and Memory: Cognition and memory normal.        Judgment: Judgment normal.     Last depression screening scores    09/02/2022   10:54 AM 12/19/2021    8:40 AM 07/07/2021    9:08 AM  PHQ 2/9 Scores  PHQ - 2 Score 0 0 2  PHQ- 9 Score 0 0 5   Last fall risk screening    09/02/2022   10:54 AM  Fall Risk   Falls in the past year? 0  Number falls in past yr: 0  Injury with Fall? 0  Risk for fall due to : No Fall Risks   Last Audit-C alcohol use screening    09/02/2022   10:54 AM  Alcohol Use Disorder Test (AUDIT)  1. How often do you have a drink containing alcohol? 0  2. How many drinks containing alcohol do you have on a typical day when you are drinking? 0  3. How often do you have six or more drinks on one occasion? 0  AUDIT-C Score 0   A score of 3 or more in women, and 4 or more in men indicates increased risk for alcohol abuse, EXCEPT if all of the points are from question 1   No results found for any visits on 09/02/22.  Assessment & Plan    Routine Health Maintenance and Physical Exam  Exercise Activities and Dietary recommendations  Goals   None     Immunization History  Administered Date(s) Administered   Influenza Inj Mdck Quad Pf 01/28/2022   Influenza Split 02/13/2009,  01/22/2010, 01/14/2011   Influenza,inj,Quad PF,6+ Mos 01/20/2013, 03/09/2014, 01/05/2017, 01/27/2019   Influenza-Unspecified 03/01/2015, 02/15/2018, 01/24/2021   PFIZER(Purple Top)SARS-COV-2 Vaccination 07/28/2019, 08/23/2019   Pneumococcal Conjugate-13 01/27/2019   Respiratory Syncytial Virus Vaccine,Recomb Aduvanted(Arexvy) 05/06/2022  Td 12/02/2016   Tdap 09/03/2005   Zoster Recombinat (Shingrix) 12/24/2020, 03/05/2021    Health Maintenance  Topic Date Due   Pneumonia Vaccine 9+ Years old (2 of 2 - PPSV23 or PCV20) 03/24/2019   COVID-19 Vaccine (3 - 2023-24 season) 01/02/2022   DEXA SCAN  Never done   MAMMOGRAM  08/19/2022   Fecal DNA (Cologuard)  11/22/2022   INFLUENZA VACCINE  12/03/2022   PAP SMEAR-Modifier  08/06/2024   DTaP/Tdap/Td (3 - Td or Tdap) 12/03/2026   Hepatitis C Screening  Completed   HIV Screening  Completed   Zoster Vaccines- Shingrix  Completed   HPV VACCINES  Aged Out   COLON CANCER SCREENING ANNUAL FOBT  Discontinued    Discussed health benefits of physical activity, and encouraged her to engage in regular exercise appropriate for her age and condition.  Problem List Items Addressed This Visit       Other   Annual physical exam - Primary    Due for vision and dental Due for mammo Due for DEXA Due for low dose lung CT Declines vaccinations Things to do to keep yourself healthy  - Exercise at least 30-45 minutes a day, 3-4 days a week.  - Eat a low-fat diet with lots of fruits and vegetables, up to 7-9 servings per day.  - Seatbelts can save your life. Wear them always.  - Smoke detectors on every level of your home, check batteries every year.  - Eye Doctor - have an eye exam every 1-2 years  - Safe sex - if you may be exposed to STDs, use a condom.  - Alcohol -  If you drink, do it moderately, less than 2 drinks per day.  - Health Care Power of Attorney. Choose someone to speak for you if you are not able.  - Depression is common in our  stressful world.If you're feeling down or losing interest in things you normally enjoy, please come in for a visit.  - Violence - If anyone is threatening or hurting you, please call immediately.       Screening breast examination   Relevant Orders   MM 3D SCREENING MAMMOGRAM BILATERAL BREAST   Tobacco dependence due to cigarettes, in remission   Relevant Orders   Ambulatory Referral Lung Cancer Screening [facility]   Other Visit Diagnoses     Post-menopausal       Relevant Orders   DG Bone Density      Return in about 1 year (around 09/03/2023) for annual examination.    Leilani Merl, FNP, have reviewed all documentation for this visit. The documentation on 09/02/22 for the exam, diagnosis, procedures, and orders are all accurate and complete.  Jacky Kindle, FNP  [facility] [phone] (phone) [phone] (fax)  [facility]

## 2022-09-02 ENCOUNTER — Ambulatory Visit (INDEPENDENT_AMBULATORY_CARE_PROVIDER_SITE_OTHER): Payer: No Typology Code available for payment source | Admitting: Family Medicine

## 2022-09-02 ENCOUNTER — Encounter: Payer: Self-pay | Admitting: Family Medicine

## 2022-09-02 VITALS — BP 108/89 | HR 70 | Temp 98.1°F | Resp 15 | Ht 62.0 in | Wt 146.4 lb

## 2022-09-02 DIAGNOSIS — Z0000 Encounter for general adult medical examination without abnormal findings: Secondary | ICD-10-CM | POA: Diagnosis not present

## 2022-09-02 DIAGNOSIS — F17211 Nicotine dependence, cigarettes, in remission: Secondary | ICD-10-CM | POA: Diagnosis not present

## 2022-09-02 DIAGNOSIS — Z780 Asymptomatic menopausal state: Secondary | ICD-10-CM

## 2022-09-02 DIAGNOSIS — Z1239 Encounter for other screening for malignant neoplasm of breast: Secondary | ICD-10-CM | POA: Diagnosis not present

## 2022-09-02 NOTE — Assessment & Plan Note (Signed)
Due for vision and dental Due for mammo Due for DEXA Due for low dose lung CT Declines vaccinations Things to do to keep yourself healthy  - Exercise at least 30-45 minutes a day, 3-4 days a week.  - Eat a low-fat diet with lots of fruits and vegetables, up to 7-9 servings per day.  - Seatbelts can save your life. Wear them always.  - Smoke detectors on every level of your home, check batteries every year.  - Eye Doctor - have an eye exam every 1-2 years  - Safe sex - if you may be exposed to STDs, use a condom.  - Alcohol -  If you drink, do it moderately, less than 2 drinks per day.  - Health Care Power of Attorney. Choose someone to speak for you if you are not able.  - Depression is common in our stressful world.If you're feeling down or losing interest in things you normally enjoy, please come in for a visit.  - Violence - If anyone is threatening or hurting you, please call immediately.

## 2022-09-08 ENCOUNTER — Other Ambulatory Visit: Payer: Self-pay | Admitting: Family Medicine

## 2022-09-08 DIAGNOSIS — I10 Essential (primary) hypertension: Secondary | ICD-10-CM

## 2022-09-08 NOTE — Telephone Encounter (Signed)
Requested Prescriptions  Pending Prescriptions Disp Refills   amLODipine (NORVASC) 5 MG tablet [Pharmacy Med Name: AMLODIPINE BESYLATE 5 MG TAB] 90 tablet 1    Sig: TAKE 1 TABLET (5 MG TOTAL) BY MOUTH DAILY.     Cardiovascular: Calcium Channel Blockers 2 Passed - 09/08/2022  2:26 AM      Passed - Last BP in normal range    BP Readings from Last 1 Encounters:  09/02/22 108/89         Passed - Last Heart Rate in normal range    Pulse Readings from Last 1 Encounters:  09/02/22 70         Passed - Valid encounter within last 6 months    Recent Outpatient Visits           6 days ago Annual physical exam   [facility] Jacky Kindle, FNP   8 months ago Annual physical exam   [facility] Jacky Kindle, FNP   1 year ago Primary hypertension   [facility] Jacky Kindle, FNP   1 year ago Primary hypertension   [place] [facility] Jacky Kindle, FNP   1 year ago Acute cough   [facility] Jacky Kindle, [place]

## 2022-09-09 ENCOUNTER — Ambulatory Visit
Admission: RE | Admit: 2022-09-09 | Discharge: 2022-09-09 | Disposition: A | Discharge status: Home / Self Care | Payer: No Typology Code available for payment source | Source: Ambulatory Visit | Attending: Family Medicine | Admitting: Family Medicine

## 2022-09-09 DIAGNOSIS — Z780 Asymptomatic menopausal state: Secondary | ICD-10-CM | POA: Diagnosis present

## 2022-09-09 DIAGNOSIS — Z1239 Encounter for other screening for malignant neoplasm of breast: Secondary | ICD-10-CM | POA: Insufficient documentation

## 2022-09-09 DIAGNOSIS — Z13820 Encounter for screening for osteoporosis: Secondary | ICD-10-CM | POA: Insufficient documentation

## 2022-09-09 DIAGNOSIS — Z1231 Encounter for screening mammogram for malignant neoplasm of breast: Secondary | ICD-10-CM | POA: Insufficient documentation

## 2022-09-09 NOTE — Progress Notes (Signed)
Normal bone strength noted. Can use Vit D 800 IU and Calcium 1200 mg to assist regular exercise for bone health. Can repeat DEXA in 5 years if desired. 

## 2022-09-10 NOTE — Progress Notes (Signed)
Hi Stephanie Ball  Normal mammogram; repeat in 1 year.  Please let us know if you have any questions.  Thank you,  Merita Norton, FNP

## 2022-09-14 ENCOUNTER — Other Ambulatory Visit: Payer: Self-pay | Admitting: Family Medicine

## 2022-09-14 DIAGNOSIS — J302 Other seasonal allergic rhinitis: Secondary | ICD-10-CM

## 2022-09-15 LAB — LIPID PANEL
Cholesterol: 153 (ref 0–200)
HDL: 74 — AB (ref 35–70)
LDL Cholesterol: 64
LDl/HDL Ratio: 2.1
Triglycerides: 69 (ref 40–160)

## 2022-09-15 LAB — BASIC METABOLIC PANEL: Glucose: 87

## 2022-09-15 LAB — HEMOGLOBIN A1C: Hemoglobin A1C: 5.3

## 2022-10-11 ENCOUNTER — Other Ambulatory Visit: Payer: Self-pay | Admitting: Family Medicine

## 2022-10-11 DIAGNOSIS — F3170 Bipolar disorder, currently in remission, most recent episode unspecified: Secondary | ICD-10-CM

## 2022-10-11 DIAGNOSIS — I10 Essential (primary) hypertension: Secondary | ICD-10-CM

## 2022-10-31 ENCOUNTER — Other Ambulatory Visit: Payer: Self-pay | Admitting: Family Medicine

## 2022-10-31 DIAGNOSIS — F5101 Primary insomnia: Secondary | ICD-10-CM

## 2022-11-05 ENCOUNTER — Other Ambulatory Visit: Payer: Self-pay | Admitting: Family Medicine

## 2022-11-05 DIAGNOSIS — F319 Bipolar disorder, unspecified: Secondary | ICD-10-CM

## 2022-11-05 DIAGNOSIS — F5101 Primary insomnia: Secondary | ICD-10-CM

## 2022-11-06 ENCOUNTER — Other Ambulatory Visit: Payer: Self-pay | Admitting: Family Medicine

## 2022-11-06 ENCOUNTER — Encounter: Payer: Self-pay | Admitting: Family Medicine

## 2022-11-06 ENCOUNTER — Other Ambulatory Visit: Payer: Self-pay | Admitting: Podiatry

## 2022-11-06 DIAGNOSIS — F410 Panic disorder [episodic paroxysmal anxiety] without agoraphobia: Secondary | ICD-10-CM

## 2022-11-06 NOTE — Telephone Encounter (Signed)
Unable to refill per protocol, Rx request is too soon for Trazodone.  Requested Prescriptions  Pending Prescriptions Disp Refills   traZODone (DESYREL) 100 MG tablet [Pharmacy Med Name: TRAZODONE 100 MG TABLET] 90 tablet     Sig: TAKE 1 TABLET (100 MG TOTAL) BY MOUTH AT BEDTIME AS NEEDED FOR FOR SLEEP.     Psychiatry: Antidepressants - Serotonin Modulator Passed - 11/05/2022  8:39 AM      Passed - Completed PHQ-2 or PHQ-9 in the last 360 days      Passed - Valid encounter within last 6 months    Recent Outpatient Visits           2 months ago Annual physical exam   [facility] Jacky Kindle, FNP   10 months ago Annual physical exam   [facility] Jacky Kindle, FNP   1 year ago Primary hypertension   [place] [facility] Jacky Kindle, FNP   1 year ago Primary hypertension   [facility] Jacky [facility], FNP   1 year ago Acute cough   [facility] Merita Norton T, FNP               OLANZapine (ZYPREXA) 15 MG tablet [Pharmacy Med Name: OLANZAPINE 15 MG TABLET] 90 tablet 3    Sig: TAKE 1 TABLET (15 MG TOTAL) BY MOUTH DAILY.     Not Delegated - Psychiatry:  Antipsychotics - Second Generation (Atypical) - olanzapine Failed - 11/05/2022  8:39 AM      Failed - This refill cannot be delegated      Failed - TSH in normal range and within 360 days    TSH  Date Value Ref Range Status  12/24/2020 2.75 0.40 - 4.50 mIU/L Final         Failed - Lipid Panel in normal range within the last 12 months    Cholesterol, Total  Date Value Ref Range Status  11/09/2018 194 100 - 199 mg/dL Final   Cholesterol  Date Value Ref Range Status  10/07/2021 143 0 - 200 Final   LDL Cholesterol (Calc)  Date Value Ref Range Status  12/24/2020 98 mg/dL (calc) Final    Comment:    Reference range: <100 . Desirable range <100 mg/dL for primary prevention;   <70 mg/dL for  patients with CHD or diabetic patients  with > or = 2 CHD risk factors. Marland Kitchen LDL-C is now calculated using the Martin-Hopkins  calculation, which is a validated novel method providing  better accuracy than the Friedewald equation in the  estimation of LDL-C.  Horald Pollen et al. Lenox Ahr. 4098;119(14): 2061-2068  (http://education.[facility].com/faq/FAQ164)    LDL Cholesterol  Date Value Ref Range Status  10/07/2021 70  Final   HDL  Date Value Ref Range Status  10/07/2021 40 35 - 70 Final  11/09/2018 42 >39 mg/dL Final   Triglycerides  Date Value Ref Range Status  12/24/2020 308 (H) <150 mg/dL Final    Comment:    . If a non-fasting specimen was collected, consider repeat triglyceride testing on a fasting specimen if clinically indicated.  Perry Mount et al. J. of Clin. Lipidol. 2015;9:129-169. .          Failed - CBC within normal limits and completed in the last 12 months    WBC  Date Value Ref Range Status  12/24/2020 7.5 3.8 - 10.8 Thousand/uL Final   RBC  Date  Value Ref Range Status  12/24/2020 4.84 3.80 - 5.10 Million/uL Final   Hemoglobin  Date Value Ref Range Status  12/24/2020 14.0 11.7 - 15.5 g/dL Final  16/02/9603 54.0 11.1 - 15.9 g/dL Final   HCT  Date Value Ref Range Status  12/24/2020 42.2 35.0 - 45.0 % Final   Hematocrit  Date Value Ref Range Status  11/09/2018 40.0 34.0 - 46.6 % Final   MCHC  Date Value Ref Range Status  12/24/2020 33.2 32.0 - 36.0 g/dL Final   [place]  Date Value Ref Range Status  12/24/2020 28.9 27.0 - 33.0 pg Final   MCV  Date Value Ref Range Status  12/24/2020 87.2 80.0 - 100.0 fL Final  11/09/2018 88 79 - 97 fL Final   No results found for: "PLTCOUNTKUC", "LABPLAT", "POCPLA" RDW  Date Value Ref Range Status  12/24/2020 13.7 11.0 - 15.0 % Final  11/09/2018 14.0 11.7 - 15.4 % Final         Failed - CMP within normal limits and completed in the last 12 months    Albumin  Date Value Ref Range Status  11/09/2018 4.1  3.8 - 4.8 g/dL Final   Alkaline Phosphatase  Date Value Ref Range Status  11/09/2018 77 39 - 117 IU/L Final   Alkaline phosphatase (APISO)  Date Value Ref Range Status  12/24/2020 86 37 - 153 U/L Final   ALT  Date Value Ref Range Status  12/24/2020 8 6 - 29 U/L Final   AST  Date Value Ref Range Status  12/24/2020 14 10 - 35 U/L Final   BUN  Date Value Ref Range Status  12/24/2020 11 7 - 25 mg/dL Final  98/03/9146 10 8 - 27 mg/dL Final   Calcium  Date Value Ref Range Status  12/24/2020 11.2 (H) 8.6 - 10.4 mg/dL Final   CO2  Date Value Ref Range Status  12/24/2020 27 20 - 32 mmol/L Final   Creat  Date Value Ref Range Status  12/24/2020 1.57 (H) 0.50 - 1.05 mg/dL Final   Glucose  Date Value Ref Range Status  02/27/2016 111 mg/dL Final   Glucose, Bld  Date Value Ref Range Status  12/24/2020 92 65 - 99 mg/dL Final    Comment:    .            Fasting reference interval .    Potassium  Date Value Ref Range Status  12/24/2020 3.1 (L) 3.5 - 5.3 mmol/L Final   Sodium  Date Value Ref Range Status  12/24/2020 140 135 - 146 mmol/L Final  11/09/2018 139 134 - 144 mmol/L Final   Total Bilirubin  Date Value Ref Range Status  12/24/2020 0.6 0.2 - 1.2 mg/dL Final   Bilirubin Total  Date Value Ref Range Status  11/09/2018 0.4 0.0 - 1.2 mg/dL Final   Protein, ur  Date Value Ref Range Status  02/16/2019 100 (A) NEGATIVE mg/dL Final   Total Protein  Date Value Ref Range Status  12/24/2020 7.1 6.1 - 8.1 g/dL Final  82/95/6213 6.4 6.0 - 8.5 g/dL Final   GFR calc Af Amer  Date Value Ref Range Status  02/16/2019 39 (L) >60 mL/min Final   GFR calc non Af Amer  Date Value Ref Range Status  02/16/2019 34 (L) >60 mL/min Final         Passed - Completed PHQ-2 or PHQ-9 in the last 360 days      Passed - Last BP in normal range  BP Readings from Last 1 Encounters:  09/02/22 108/89         Passed - Last Heart Rate in normal range    Pulse Readings from Last  1 Encounters:  09/02/22 70         Passed - Valid encounter within last 6 months    Recent Outpatient Visits           2 months ago Annual physical exam   [facility] Jacky Kindle, FNP   10 months ago Annual physical exam   [facility] Jacky Kindle, FNP   1 year ago Primary hypertension   [place] [facility] Jacky Kindle, FNP   1 year ago Primary hypertension    [facility] Jacky [facility], FNP   1 year ago Acute cough   [facility] Jacky Kindle, [place]

## 2022-11-06 NOTE — Telephone Encounter (Signed)
Requested medication (s) are due for refill today: routing for approval  Requested medication (s) are on the active medication list: yes  Last refill:  12/14/21  Future visit scheduled: no  Notes to clinic:  Unable to refill per protocol, cannot delegate.      Requested Prescriptions  Pending Prescriptions Disp Refills   OLANZapine (ZYPREXA) 15 MG tablet [Pharmacy Med Name: OLANZAPINE 15 MG TABLET] 90 tablet 3    Sig: TAKE 1 TABLET (15 MG TOTAL) BY MOUTH DAILY.     Not Delegated - Psychiatry:  Antipsychotics - Second Generation (Atypical) - olanzapine Failed - 11/05/2022  8:39 AM      Failed - This refill cannot be delegated      Failed - TSH in normal range and within 360 days    TSH  Date Value Ref Range Status  12/24/2020 2.75 0.40 - 4.50 mIU/L Final         Failed - Lipid Panel in normal range within the last 12 months    Cholesterol, Total  Date Value Ref Range Status  11/09/2018 194 100 - 199 mg/dL Final   Cholesterol  Date Value Ref Range Status  10/07/2021 143 0 - 200 Final   LDL Cholesterol (Calc)  Date Value Ref Range Status  12/24/2020 98 mg/dL (calc) Final    Comment:    Reference range: <100 . Desirable range <100 mg/dL for primary prevention;   <70 mg/dL for patients with CHD or diabetic patients  with > or = 2 CHD risk factors. Marland Kitchen LDL-C is now calculated using the Martin-Hopkins  calculation, which is a validated novel method providing  better accuracy than the Friedewald equation in the  estimation of LDL-C.  Horald Pollen et al. Lenox Ahr. 9604;540(98): 2061-2068  (http://education.[facility].com/faq/FAQ164)    LDL Cholesterol  Date Value Ref Range Status  10/07/2021 70  Final   HDL  Date Value Ref Range Status  10/07/2021 40 35 - 70 Final  11/09/2018 42 >39 mg/dL Final   Triglycerides  Date Value Ref Range Status  12/24/2020 308 (H) <150 mg/dL Final    Comment:    . If a non-fasting specimen was collected, consider repeat triglyceride  testing on a fasting specimen if clinically indicated.  Perry Mount et al. J. of Clin. Lipidol. 2015;9:129-169. .          Failed - CBC within normal limits and completed in the last 12 months    WBC  Date Value Ref Range Status  12/24/2020 7.5 3.8 - 10.8 Thousand/uL Final   RBC  Date Value Ref Range Status  12/24/2020 4.84 3.80 - 5.10 Million/uL Final   Hemoglobin  Date Value Ref Range Status  12/24/2020 14.0 11.7 - 15.5 g/dL Final  11/91/4782 95.6 11.1 - 15.9 g/dL Final   HCT  Date Value Ref Range Status  12/24/2020 42.2 35.0 - 45.0 % Final   Hematocrit  Date Value Ref Range Status  11/09/2018 40.0 34.0 - 46.6 % Final   MCHC  Date Value Ref Range Status  12/24/2020 33.2 32.0 - 36.0 g/dL Final   [facility]  Date Value Ref Range Status  12/24/2020 28.9 27.0 - 33.0 pg Final   MCV  Date Value Ref Range Status  12/24/2020 87.2 80.0 - 100.0 fL Final  11/09/2018 88 79 - 97 fL Final   No results found for: "PLTCOUNTKUC", "LABPLAT", "POCPLA" RDW  Date Value Ref Range Status  12/24/2020 13.7 11.0 - 15.0 % Final  11/09/2018 14.0 11.7 - 15.4 % Final  Failed - CMP within normal limits and completed in the last 12 months    Albumin  Date Value Ref Range Status  11/09/2018 4.1 3.8 - 4.8 g/dL Final   Alkaline Phosphatase  Date Value Ref Range Status  11/09/2018 77 39 - 117 IU/L Final   Alkaline phosphatase (APISO)  Date Value Ref Range Status  12/24/2020 86 37 - 153 U/L Final   ALT  Date Value Ref Range Status  12/24/2020 8 6 - 29 U/L Final   AST  Date Value Ref Range Status  12/24/2020 14 10 - 35 U/L Final   BUN  Date Value Ref Range Status  12/24/2020 11 7 - 25 mg/dL Final  16/02/9603 10 8 - 27 mg/dL Final   Calcium  Date Value Ref Range Status  12/24/2020 11.2 (H) 8.6 - 10.4 mg/dL Final   CO2  Date Value Ref Range Status  12/24/2020 27 20 - 32 mmol/L Final   Creat  Date Value Ref Range Status  12/24/2020 1.57 (H) 0.50 - 1.05 mg/dL Final    Glucose  Date Value Ref Range Status  02/27/2016 111 mg/dL Final   Glucose, Bld  Date Value Ref Range Status  12/24/2020 92 65 - 99 mg/dL Final    Comment:    .            Fasting reference interval .    Potassium  Date Value Ref Range Status  12/24/2020 3.1 (L) 3.5 - 5.3 mmol/L Final   Sodium  Date Value Ref Range Status  12/24/2020 140 135 - 146 mmol/L Final  11/09/2018 139 134 - 144 mmol/L Final   Total Bilirubin  Date Value Ref Range Status  12/24/2020 0.6 0.2 - 1.2 mg/dL Final   Bilirubin Total  Date Value Ref Range Status  11/09/2018 0.4 0.0 - 1.2 mg/dL Final   Protein, ur  Date Value Ref Range Status  02/16/2019 100 (A) NEGATIVE mg/dL Final   Total Protein  Date Value Ref Range Status  12/24/2020 7.1 6.1 - 8.1 g/dL Final  54/01/8118 6.4 6.0 - 8.5 g/dL Final   GFR calc Af Amer  Date Value Ref Range Status  02/16/2019 39 (L) >60 mL/min Final   GFR calc non Af Amer  Date Value Ref Range Status  02/16/2019 34 (L) >60 mL/min Final         Passed - Completed PHQ-2 or PHQ-9 in the last 360 days      Passed - Last BP in normal range    BP Readings from Last 1 Encounters:  09/02/22 108/89         Passed - Last Heart Rate in normal range    Pulse Readings from Last 1 Encounters:  09/02/22 70         Passed - Valid encounter within last 6 months    Recent Outpatient Visits           2 months ago Annual physical exam   [facility] Jacky Kindle, FNP   10 months ago Annual physical exam   [facility] Jacky Kindle, FNP   1 year ago Primary hypertension   [facility] Jacky Kindle, FNP   1 year ago Primary hypertension   [facility] Jacky Kindle, FNP   1 year ago Acute cough   [facility] Jacky Kindle, [place]  Refused Prescriptions Disp Refills   traZODone (DESYREL) 100 MG tablet [Pharmacy  Med Name: TRAZODONE 100 MG TABLET] 90 tablet     Sig: TAKE 1 TABLET (100 MG TOTAL) BY MOUTH AT BEDTIME AS NEEDED FOR FOR SLEEP.     Psychiatry: Antidepressants - Serotonin Modulator Passed - 11/05/2022  8:39 AM      Passed - Completed PHQ-2 or PHQ-9 in the last 360 days      Passed - Valid encounter within last 6 months    Recent Outpatient Visits           2 months ago Annual physical exam   [facility] Jacky Kindle, FNP   10 months ago Annual physical exam   [facility] Jacky Kindle, FNP   1 year ago Primary hypertension   [facility] Jacky Kindle, FNP   1 year ago Primary hypertension   [facility] Jacky Kindle, FNP   1 year ago Acute cough   [facility] Jacky Kindle, FNP

## 2022-11-10 ENCOUNTER — Encounter: Payer: Self-pay | Admitting: Family Medicine

## 2022-11-10 ENCOUNTER — Ambulatory Visit: Payer: No Typology Code available for payment source | Admitting: Family Medicine

## 2022-11-10 ENCOUNTER — Encounter: Payer: Self-pay | Admitting: *Deleted

## 2022-11-10 VITALS — BP 95/62 | HR 53 | Ht 62.0 in | Wt 159.0 lb

## 2022-11-10 DIAGNOSIS — F5101 Primary insomnia: Secondary | ICD-10-CM

## 2022-11-10 DIAGNOSIS — Z01818 Encounter for other preprocedural examination: Secondary | ICD-10-CM | POA: Diagnosis not present

## 2022-11-10 DIAGNOSIS — F410 Panic disorder [episodic paroxysmal anxiety] without agoraphobia: Secondary | ICD-10-CM

## 2022-11-10 DIAGNOSIS — F319 Bipolar disorder, unspecified: Secondary | ICD-10-CM | POA: Diagnosis not present

## 2022-11-10 MED ORDER — PROPRANOLOL HCL 20 MG PO TABS: 20.0000 mg | ORAL_TABLET | Freq: Every day | ORAL | 3 refills | Status: DC | PRN

## 2022-11-10 NOTE — Assessment & Plan Note (Signed)
Chronic; well controlled outside of insomnia worsening -buspar 30 mg bid -atarax 25 mg tid prn -zyprexa 15 mg daily -propranolol 20 mg daily prn -trazodone 200 mg at bedtime -ambien 5 mg

## 2022-11-10 NOTE — Assessment & Plan Note (Signed)
Planned podiatry appt later this month Discussed use of ASA holding 3 days prior and 3 days after procedure or as recommended by podiatry

## 2022-11-10 NOTE — Assessment & Plan Note (Signed)
Chronic; worsening Reports insurance coverage of ambien reduced to 5 mg due to age criteria Continues on trazodone at 200 mg Could increase trazodone if needed or change to seroquel to assist Can also use PRN atarax if needed to assist with change in Ambien dosing Continue to monitor

## 2022-11-10 NOTE — Progress Notes (Signed)
Established patient visit   Patient: Stephanie Ball   DOB: [birth year]   67 y.o. Female  MRN: [id number] Visit Date: 11/10/2022  Today's healthcare provider: Jacky Kindle, FNP  Introduced to nurse practitioner role and practice setting.  All questions answered.  Discussed provider/patient relationship and expectations.   Chief Complaint  Patient presents with   Panic Attack    Pt stated--last panic attack 1 month ago, lasted 5 minutes.   Subjective    HPI HPI     Panic Attack    Additional comments: Pt stated--last panic attack 1 month ago, lasted 5 minutes.      Last edited by Shelly Bombard, CMA on 11/10/2022  8:33 AM.       Medications: Outpatient Medications Prior to Visit  Medication Sig   amLODipine (NORVASC) 5 MG tablet TAKE 1 TABLET (5 MG TOTAL) BY MOUTH DAILY.   aspirin EC 81 MG tablet Take 81 mg by mouth daily.   busPIRone (BUSPAR) 30 MG tablet TAKE 1 TABLET BY MOUTH 2 TIMES DAILY.   clotrimazole-betamethasone (LOTRISONE) cream APPLY TOPICALLY 2 TIMES DAILY   famotidine (PEPCID) 20 MG tablet Take 20 mg by mouth 2 (two) times daily.   hydrochlorothiazide (HYDRODIURIL) 12.5 MG tablet TAKE 1 TABLET BY MOUTH EVERY DAY   hydrOXYzine (ATARAX) 25 MG tablet TAKE 1 TABLET BY MOUTH THREE TIMES A DAY AS NEEDED   lisinopril (ZESTRIL) 40 MG tablet TAKE 1 TABLET BY MOUTH EVERY DAY   lovastatin (MEVACOR) 40 MG tablet Take 40 mg by mouth at bedtime.   montelukast (SINGULAIR) 10 MG tablet TAKE 1 TABLET BY MOUTH EVERY DAY   OLANZapine (ZYPREXA) 15 MG tablet TAKE 1 TABLET (15 MG TOTAL) BY MOUTH DAILY.   propranolol (INDERAL) 20 MG tablet Take 1 tablet (20 mg total) by mouth daily as needed.   rosuvastatin (CRESTOR) 20 MG tablet TAKE 1 TABLET BY MOUTH EVERY DAY   traZODone (DESYREL) 100 MG tablet TAKE 2 TABLETS (200 MG TOTAL) BY MOUTH AT BEDTIME AS NEEDED FOR SLEEP   venlafaxine (EFFEXOR) 75 MG tablet TAKE 1 TABLET (75 MG TOTAL) BY MOUTH 3 (THREE) TIMES DAILY WITH MEALS.    Vitamin D, Ergocalciferol, (DRISDOL) 1.25 MG (50000 UNIT) CAPS capsule TAKE 1 CAPSULE (50,000 UNITS TOTAL) BY MOUTH EVERY 7 (SEVEN) DAYS   zolpidem (AMBIEN) 10 MG tablet Take 1 tablet (10 mg total) by mouth at bedtime as needed for sleep. Please schedule office visit before any future refill.   colchicine 0.6 MG tablet Take 2 tabs PO at onset of symptoms, and one tab PO daily until symptoms resolve. Repeat as needed (Patient not taking: Reported on 11/10/2022)   No facility-administered medications prior to visit.    Review of Systems    Objective    BP 95/62   Pulse (!) 53   Ht 5\' 2"  (1.575 m)   Wt 159 lb (72.1 kg)   SpO2 92%   BMI 29.08 kg/m   Physical Exam Vitals and nursing note reviewed.  Constitutional:      General: She is not in acute distress.    Appearance: Normal appearance. She is overweight. She is not ill-appearing, toxic-appearing or diaphoretic.  HENT:     Head: Normocephalic and atraumatic.  Cardiovascular:     Rate and Rhythm: Normal rate and regular rhythm.     Pulses: Normal pulses.     Heart sounds: Normal heart sounds. No murmur heard.    No friction rub. No gallop.  Pulmonary:     Effort: Pulmonary effort is normal. No respiratory distress.     Breath sounds: Normal breath sounds. No stridor. No wheezing, rhonchi or rales.  Chest:     Chest wall: No tenderness.  Abdominal:     General: Bowel sounds are normal.     Palpations: Abdomen is soft.  Musculoskeletal:        General: No swelling, tenderness, deformity or signs of injury. Normal range of motion.     Right lower leg: No edema.     Left lower leg: No edema.  Skin:    General: Skin is warm and dry.     Capillary Refill: Capillary refill takes less than 2 seconds.     Coloration: Skin is not jaundiced or pale.     Findings: No bruising, erythema, lesion or rash.  Neurological:     General: No focal deficit present.     Mental Status: She is alert and oriented to person, place, and time.  Mental status is at baseline.     Cranial Nerves: No cranial nerve deficit.     Sensory: No sensory deficit.     Motor: No weakness.     Coordination: Coordination normal.  Psychiatric:        Mood and Affect: Mood normal.        Behavior: Behavior normal.        Thought Content: Thought content normal.        Judgment: Judgment normal.      No results found for any visits on 11/10/22.  Assessment & Plan     Problem List Items Addressed This Visit       Other   Bipolar 1 disorder (HCC)    Chronic; well controlled outside of insomnia worsening -buspar 30 mg bid -atarax 25 mg tid prn -zyprexa 15 mg daily -propranolol 20 mg daily prn -trazodone 200 mg at bedtime -ambien 5 mg      Insomnia    Chronic; worsening Reports insurance coverage of ambien reduced to 5 mg due to age criteria Continues on trazodone at 200 mg Could increase trazodone if needed or change to seroquel to assist Can also use PRN atarax if needed to assist with change in Ambien dosing Continue to monitor       Panic attacks - Primary    Chronic; stable on propranolol 20 mg daily prn  Continue to monitor bradycardia and hypotension s/s use of BB       Pre-operative clearance    Planned podiatry appt later this month Discussed use of ASA holding 3 days prior and 3 days after procedure or as recommended by podiatry       Return if symptoms worsen or fail to improve.     Leilani Merl, FNP, have reviewed all documentation for this visit. The documentation on 11/10/22 for the exam, diagnosis, procedures, and orders are all accurate and complete.  Jacky Kindle, FNP  [facility] [phone] (phone) [phone] (fax)  [facility]

## 2022-11-10 NOTE — Assessment & Plan Note (Signed)
Chronic; stable on propranolol 20 mg daily prn  Continue to monitor bradycardia and hypotension s/s use of BB

## 2022-11-11 ENCOUNTER — Encounter: Payer: Self-pay | Admitting: Podiatry

## 2022-11-11 ENCOUNTER — Telehealth: Payer: Self-pay

## 2022-11-11 ENCOUNTER — Other Ambulatory Visit: Payer: Self-pay | Admitting: Family Medicine

## 2022-11-11 DIAGNOSIS — Z01818 Encounter for other preprocedural examination: Secondary | ICD-10-CM

## 2022-11-11 NOTE — Anesthesia Preprocedure Evaluation (Addendum)
Anesthesia Evaluation  Patient identified by MRN, date of birth, ID band Patient awake    Reviewed: Allergy & Precautions, H&P , NPO status , Patient's Chart, lab work & pertinent test results, reviewed documented beta blocker date and time   Airway Mallampati: III  TM Distance: <3 FB Neck ROM: Full    Dental no notable dental hx. (+) Missing, Edentulous Upper Removed upper partial:   Pulmonary Patient abstained from smoking., former smoker   Pulmonary exam normal breath sounds clear to auscultation       Cardiovascular hypertension, Pt. on home beta blockers and Pt. on medications Normal cardiovascular exam Rhythm:Regular Rate:Normal     Neuro/Psych  PSYCHIATRIC DISORDERS Anxiety Depression Bipolar Disorder   negative neurological ROS  negative psych ROS   GI/Hepatic negative GI ROS, Neg liver ROS,GERD  ,,  Endo/Other  negative endocrine ROS    Renal/GU Renal diseasenegative Renal ROS  negative genitourinary   Musculoskeletal negative musculoskeletal ROS (+) Arthritis ,    Abdominal   Peds negative pediatric ROS (+)  Hematology negative hematology ROS (+)   Anesthesia Other Findings Hyperlipidemia Hypercalcemia Hypokalemia Pure hypercholesterolemia Bipolar disorder (HCC) Depressive disorder Panic attack Hypertension Chronic kidney disease Hydronephrosis of left kidney Hyperglycemia Fibrocystic breast disease 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 Hematuria GERD (gastroesophageal reflux disease) Seasonal allergic rhinitis  Reproductive/Obstetrics negative OB ROS                             Anesthesia Physical Anesthesia Plan  ASA: 3  Anesthesia Plan: General   Post-op Pain Management: Regional block and Oxycodone PO   Induction: Intravenous  PONV Risk Score and Plan:   Airway Management Planned: LMA  Additional Equipment:   Intra-op Plan:   Post-operative Plan:  Extubation in OR  Informed Consent: I have reviewed the patients History and Physical, chart, labs and discussed the procedure including the risks, benefits and alternatives for the proposed anesthesia with the patient or authorized representative who has indicated his/her understanding and acceptance.     Dental Advisory Given  Plan Discussed with: Anesthesiologist, CRNA and Surgeon  Anesthesia Plan Comments: (Patient consented for risks of anesthesia including but not limited to:  - adverse reactions to medications - damage to eyes, teeth, lips or other oral mucosa - nerve damage due to positioning  - sore throat or hoarseness - Damage to heart, brain, nerves, lungs, other parts of body or loss of life  Patient voiced understanding.)       Anesthesia Quick Evaluation

## 2022-11-11 NOTE — Telephone Encounter (Signed)
Copied from CRM [id number]. Topic: Appointment Scheduling - Scheduling Inquiry for Clinic >> Nov 11, 2022  1:09 PM Marlow Baars wrote: Reason for CRM: The patient called in requesting an order for labs to get her potassium level checked as she has an upcoming surgery and they need this updated information as soon as possible. She saw her provider yesterday but just found out today she needs this from her surgeons office. Please assist patient further.

## 2022-11-12 ENCOUNTER — Telehealth: Payer: Self-pay | Admitting: Family Medicine

## 2022-11-12 NOTE — Telephone Encounter (Signed)
Received a fax from covermymeds for zolpidem tartrate 10mg   Key: [id number]

## 2022-11-17 ENCOUNTER — Other Ambulatory Visit: Payer: Self-pay | Admitting: Family Medicine

## 2022-11-17 DIAGNOSIS — N1832 Chronic kidney disease, stage 3b: Secondary | ICD-10-CM

## 2022-11-17 LAB — COMPREHENSIVE METABOLIC PANEL: Potassium: 3.9 mmol/L (ref 3.5–5.2)

## 2022-11-17 LAB — NICOTINE/COTININE METABOLITES

## 2022-11-17 LAB — CBC WITH DIFFERENTIAL/PLATELET
EOS (ABSOLUTE): 0.4 10*3/uL (ref 0.0–0.4)
Eos: 4 %
Hematocrit: 36.9 % (ref 34.0–46.6)
RDW: 13.2 % (ref 11.7–15.4)

## 2022-11-17 LAB — APTT: aPTT: 28 s (ref 24–33)

## 2022-11-17 LAB — HEMOGLOBIN A1C: Hgb A1c MFr Bld: 5.7 % — ABNORMAL HIGH (ref 4.8–5.6)

## 2022-11-17 NOTE — Progress Notes (Signed)
Labs completed for surgery. Interval elevation in creatinine; continue to monitor hydration as well as sodium content in diet. Pre-diabetes noted. Referral placed to nephrologist to further assist. Proceed with kidney ultrasound as well to gather further data for nephrology.

## 2022-11-18 ENCOUNTER — Encounter: Payer: Self-pay | Admitting: Podiatry

## 2022-11-18 LAB — CBC WITH DIFFERENTIAL/PLATELET
Basophils Absolute: 0 10*3/uL (ref 0.0–0.2)
Basos: 0 %
Hemoglobin: 12.7 g/dL (ref 11.1–15.9)
Immature Grans (Abs): 0 10*3/uL (ref 0.0–0.1)
Immature Granulocytes: 0 %
Lymphocytes Absolute: 2.3 10*3/uL (ref 0.7–3.1)
Lymphs: 27 %
MCH: 28.5 pg (ref 26.6–33.0)
MCHC: 34.4 g/dL (ref 31.5–35.7)
MCV: 83 fL (ref 79–97)
Monocytes Absolute: 0.6 10*3/uL (ref 0.1–0.9)
Monocytes: 6 %
Neutrophils Absolute: 5.5 10*3/uL (ref 1.4–7.0)
Neutrophils: 63 %
Platelets: 232 10*3/uL (ref 150–450)
RBC: 4.45 x10E6/uL (ref 3.77–5.28)
WBC: 8.8 10*3/uL (ref 3.4–10.8)

## 2022-11-18 LAB — COMPREHENSIVE METABOLIC PANEL
ALT: 13 IU/L (ref 0–32)
AST: 18 IU/L (ref 0–40)
Albumin: 3.9 g/dL (ref 3.9–4.9)
Alkaline Phosphatase: 86 IU/L (ref 44–121)
BUN/Creatinine Ratio: 12 (ref 12–28)
BUN: 21 mg/dL (ref 8–27)
Bilirubin Total: 0.6 mg/dL (ref 0.0–1.2)
CO2: 22 mmol/L (ref 20–29)
Calcium: 10.7 mg/dL — ABNORMAL HIGH (ref 8.7–10.3)
Chloride: 104 mmol/L (ref 96–106)
Creatinine, Ser: 1.71 mg/dL — ABNORMAL HIGH (ref 0.57–1.00)
Globulin, Total: 2.4 g/dL (ref 1.5–4.5)
Glucose: 107 mg/dL — ABNORMAL HIGH (ref 70–99)
Sodium: 142 mmol/L (ref 134–144)
Total Protein: 6.3 g/dL (ref 6.0–8.5)
eGFR: 33 mL/min/{1.73_m2} — ABNORMAL LOW (ref >59)

## 2022-11-18 LAB — NICOTINE/COTININE METABOLITES: Cotinine: 60.9 ng/mL

## 2022-11-18 LAB — HEMOGLOBIN A1C: Est. average glucose Bld gHb Est-mCnc: 117 mg/dL

## 2022-11-18 LAB — TSH: TSH: 1.23 u[IU]/mL (ref 0.450–4.500)

## 2022-11-18 LAB — PROTIME-INR
INR: 1 (ref 0.9–1.2)
Prothrombin Time: 10.6 s (ref 9.1–12.0)

## 2022-11-19 ENCOUNTER — Ambulatory Visit: Payer: No Typology Code available for payment source | Admitting: Anesthesiology

## 2022-11-19 ENCOUNTER — Ambulatory Visit: Payer: Self-pay

## 2022-11-19 ENCOUNTER — Other Ambulatory Visit: Payer: Self-pay

## 2022-11-19 ENCOUNTER — Ambulatory Visit
Admission: RE | Admit: 2022-11-19 | Discharge: 2022-11-19 | Disposition: A | Discharge status: Home / Self Care | Payer: No Typology Code available for payment source | Attending: Podiatry | Admitting: Podiatry

## 2022-11-19 ENCOUNTER — Encounter: Payer: Self-pay | Admitting: Podiatry

## 2022-11-19 ENCOUNTER — Encounter
Admission: RE | Disposition: A | Discharge status: Home / Self Care | Payer: Self-pay | Source: Home / Self Care | Attending: Podiatry

## 2022-11-19 DIAGNOSIS — F410 Panic disorder [episodic paroxysmal anxiety] without agoraphobia: Secondary | ICD-10-CM

## 2022-11-19 DIAGNOSIS — M25775 Osteophyte, left foot: Secondary | ICD-10-CM | POA: Diagnosis not present

## 2022-11-19 DIAGNOSIS — E7800 Pure hypercholesterolemia, unspecified: Secondary | ICD-10-CM

## 2022-11-19 DIAGNOSIS — M2022 Hallux rigidus, left foot: Secondary | ICD-10-CM | POA: Diagnosis present

## 2022-11-19 DIAGNOSIS — Z01818 Encounter for other preprocedural examination: Secondary | ICD-10-CM

## 2022-11-19 DIAGNOSIS — I10 Essential (primary) hypertension: Secondary | ICD-10-CM | POA: Diagnosis not present

## 2022-11-19 DIAGNOSIS — F319 Bipolar disorder, unspecified: Secondary | ICD-10-CM | POA: Diagnosis not present

## 2022-11-19 DIAGNOSIS — M109 Gout, unspecified: Secondary | ICD-10-CM | POA: Diagnosis not present

## 2022-11-19 DIAGNOSIS — F419 Anxiety disorder, unspecified: Secondary | ICD-10-CM | POA: Diagnosis not present

## 2022-11-19 DIAGNOSIS — N1832 Chronic kidney disease, stage 3b: Secondary | ICD-10-CM

## 2022-11-19 DIAGNOSIS — R0602 Shortness of breath: Secondary | ICD-10-CM

## 2022-11-19 HISTORY — PX: ARTHRODESIS METATARSALPHALANGEAL JOINT (MTPJ): SHX6566

## 2022-11-19 HISTORY — DX: Other seasonal allergic rhinitis: J30.2

## 2022-11-19 HISTORY — DX: Gastro-esophageal reflux disease without esophagitis: K21.9

## 2022-11-19 SURGERY — FUSION, JOINT, GREAT TOE
Anesthesia: General | Site: Toe | Laterality: Left

## 2022-11-19 MED ORDER — PHENYLEPHRINE HCL-NACL 20-0.9 MG/250ML-% IV SOLN
INTRAVENOUS | Status: DC | PRN
  Administered 2022-11-19: 50 ug/min via INTRAVENOUS

## 2022-11-19 MED ORDER — PHENYLEPHRINE 80 MCG/ML (10ML) SYRINGE FOR IV PUSH (FOR BLOOD PRESSURE SUPPORT): PREFILLED_SYRINGE | INTRAVENOUS | Status: DC | PRN

## 2022-11-19 MED ORDER — HYDROCODONE-ACETAMINOPHEN 7.5-325 MG PO TABS: 1.0000 | ORAL_TABLET | Freq: Four times a day (QID) | ORAL | 0 refills | Status: AC | PRN

## 2022-11-19 MED ORDER — BUPIVACAINE HCL (PF) 0.25 % IJ SOLN
INTRAMUSCULAR | Status: DC | PRN
  Administered 2022-11-19: 10 mL

## 2022-11-19 MED ORDER — LACTATED RINGERS IV SOLN: INTRAVENOUS | Status: DC

## 2022-11-19 MED ORDER — ASPIRIN EC 81 MG PO TBEC: 81.0000 mg | DELAYED_RELEASE_TABLET | Freq: Two times a day (BID) | ORAL | Status: AC

## 2022-11-19 MED ORDER — PHENYLEPHRINE HCL (PRESSORS) 10 MG/ML IV SOLN
INTRAVENOUS | Status: DC | PRN
  Administered 2022-11-19 (×2): 80 ug via INTRAVENOUS
  Administered 2022-11-19: 120 ug via INTRAVENOUS
  Administered 2022-11-19: 80 ug via INTRAVENOUS
  Administered 2022-11-19: 120 ug via INTRAVENOUS

## 2022-11-19 MED ORDER — LIDOCAINE HCL (CARDIAC) PF 100 MG/5ML IV SOSY
PREFILLED_SYRINGE | INTRAVENOUS | Status: DC | PRN
  Administered 2022-11-19: 100 mg via INTRAVENOUS

## 2022-11-19 MED ORDER — BUPIVACAINE LIPOSOME 1.3 % IJ SUSP
INTRAMUSCULAR | Status: DC | PRN
  Administered 2022-11-19: 10 mL

## 2022-11-19 MED ORDER — ONDANSETRON HCL 4 MG/2ML IJ SOLN
INTRAMUSCULAR | Status: DC | PRN
  Administered 2022-11-19: 4 mg via INTRAVENOUS

## 2022-11-19 MED ORDER — PROPOFOL 10 MG/ML IV BOLUS
INTRAVENOUS | Status: DC | PRN
Start: 2022-11-19 — End: 2022-11-19
  Administered 2022-11-19: 150 mg via INTRAVENOUS

## 2022-11-19 MED ORDER — CEFAZOLIN SODIUM-DEXTROSE 2-4 GM/100ML-% IV SOLN
2.0000 g | INTRAVENOUS | Status: AC
  Administered 2022-11-19: 2 g via INTRAVENOUS

## 2022-11-19 MED ORDER — 0.9 % SODIUM CHLORIDE (POUR BTL) OPTIME
TOPICAL | Status: DC | PRN
  Administered 2022-11-19: 500 mL

## 2022-11-19 MED ORDER — MIDAZOLAM HCL 2 MG/2ML IJ SOLN
INTRAMUSCULAR | Status: DC | PRN
  Administered 2022-11-19: 1 mg via INTRAVENOUS

## 2022-11-19 MED ORDER — FENTANYL CITRATE (PF) 100 MCG/2ML IJ SOLN
INTRAMUSCULAR | Status: DC | PRN
  Administered 2022-11-19: 50 ug via INTRAVENOUS

## 2022-11-19 MED ORDER — DOXYCYCLINE MONOHYDRATE 100 MG PO TABS: 100.0000 mg | ORAL_TABLET | Freq: Two times a day (BID) | ORAL | 0 refills | Status: DC

## 2022-11-19 MED ORDER — EPHEDRINE SULFATE (PRESSORS) 50 MG/ML IJ SOLN
INTRAMUSCULAR | Status: DC | PRN
  Administered 2022-11-19: 5 mg via INTRAVENOUS
  Administered 2022-11-19 (×2): 10 mg via INTRAVENOUS
  Administered 2022-11-19: 5 mg via INTRAVENOUS

## 2022-11-19 SURGICAL SUPPLY — 58 items
BIT DRILL 2 FENESTRATED (MISCELLANEOUS) IMPLANT
BIT DRILL CANNULATED 2.3MM (DRILL) IMPLANT
BIT DRILL SOLID 2.0 X 110MM (DRILL) IMPLANT
BLADE OSC/SAGITTAL MD 9X18.5 (BLADE) IMPLANT
BLADE SURG 15 STRL LF DISP TIS (BLADE) IMPLANT
BLADE SURG 15 STRL SS (BLADE)
BNDG CMPR 5X4 CHSV STRCH STRL (GAUZE/BANDAGES/DRESSINGS) ×1
BNDG CMPR 75X41 PLY HI ABS (GAUZE/BANDAGES/DRESSINGS) ×1
BNDG COHESIVE 4X5 TAN STRL LF (GAUZE/BANDAGES/DRESSINGS) ×1 IMPLANT
BNDG ESMARCH 4 X 12 STRL LF (GAUZE/BANDAGES/DRESSINGS) ×1
BNDG ESMARCH 4X12 STRL LF (GAUZE/BANDAGES/DRESSINGS) IMPLANT
BNDG GAUZE DERMACEA FLUFF 4 (GAUZE/BANDAGES/DRESSINGS) ×1 IMPLANT
BNDG GZE DERMACEA 4 6PLY (GAUZE/BANDAGES/DRESSINGS) ×1
BNDG STRETCH 4X75 STRL LF (GAUZE/BANDAGES/DRESSINGS) ×1 IMPLANT
CANISTER SUCT 1200ML W/VALVE (MISCELLANEOUS) ×1 IMPLANT
CONE REAMER 21 (MISCELLANEOUS) IMPLANT
COUNTERSINK HEADED 3.5 (ORTHOPEDIC DISPOSABLE SUPPLIES) ×1
COVER LIGHT HANDLE UNIVERSAL (MISCELLANEOUS) ×2 IMPLANT
CUFF TOURN SGL QUICK 18X4 (TOURNIQUET CUFF) IMPLANT
CUP REAMER 21 (MISCELLANEOUS) IMPLANT
DRAPE FLUOR MINI C-ARM 54X84 (DRAPES) ×1 IMPLANT
DRILL CANNULATED 2.3MM (DRILL) ×1
DRILL SOLID 2.0 X 110MM (DRILL) ×1
DURAPREP 26ML APPLICATOR (WOUND CARE) ×1 IMPLANT
ELECT REM PT RETURN 9FT ADLT (ELECTROSURGICAL) ×1
ELECTRODE REM PT RTRN 9FT ADLT (ELECTROSURGICAL) ×1 IMPLANT
GAUZE SPONGE 4X4 12PLY STRL (GAUZE/BANDAGES/DRESSINGS) ×1 IMPLANT
GAUZE XEROFORM 1X8 LF (GAUZE/BANDAGES/DRESSINGS) ×1 IMPLANT
GLOVE BIOGEL PI IND STRL 7.5 (GLOVE) ×1 IMPLANT
GLOVE SURG SS PI 7.0 STRL IVOR (GLOVE) ×1 IMPLANT
GOWN STRL REUS W/ TWL LRG LVL3 (GOWN DISPOSABLE) ×2 IMPLANT
GOWN STRL REUS W/TWL LRG LVL3 (GOWN DISPOSABLE) ×2
K-WIRE SMOOTH 1.6X150MM (WIRE) ×1
K-WIRE SNGL END 1.2X150 (MISCELLANEOUS) ×1
KIT TURNOVER KIT A (KITS) ×1 IMPLANT
KWIRE SMOOTH 1.6X150MM (WIRE) IMPLANT
KWIRE SNGL END 1.2X150 (MISCELLANEOUS) IMPLANT
NS IRRIG 500ML POUR BTL (IV SOLUTION) ×1 IMPLANT
PACK EXTREMITY ARMC (MISCELLANEOUS) ×1 IMPLANT
PIN BALLS 3/8 F/.045 WIRE (MISCELLANEOUS) ×1 IMPLANT
PLATE MTP MED L AERO DEGREE (Plate) IMPLANT
REAMER SLEEVE (Sleeve) IMPLANT
SCREW 2.7 NON LOCK (Screw) IMPLANT
SCREW 3.5X16 NONLOCKING (Screw) IMPLANT
SCREW CANN ST 3.5X26 (Screw) IMPLANT
SCREW COUNTERSINK HEADED 3.5 (ORTHOPEDIC DISPOSABLE SUPPLIES) IMPLANT
SCREW LOCK PLATE R3 2.7X14 (Screw) IMPLANT
SCREW LOCK PLATE R3 2.7X15 (Screw) IMPLANT
SCREW LOCK PLATE R3 2.7X16 (Screw) IMPLANT
STOCKINETTE IMPERVIOUS LG (DRAPES) ×1 IMPLANT
SUT MNCRL 4-0 (SUTURE) ×1
SUT MNCRL 4-0 27XMFL (SUTURE) ×1
SUT VIC AB 3-0 SH 27 (SUTURE) ×1
SUT VIC AB 3-0 SH 27X BRD (SUTURE) IMPLANT
SUT VIC AB 4-0 SH 27 (SUTURE) ×1
SUT VIC AB 4-0 SH 27XANBCTRL (SUTURE) IMPLANT
SUTURE MNCRL 4-0 27XMF (SUTURE) IMPLANT
WIRE OLIVE SMOOTH 1.4MMX60MM (WIRE) IMPLANT

## 2022-11-19 NOTE — Discharge Instructions (Signed)
[facility]  POST OPERATIVE INSTRUCTIONS FOR DR. Ether Griffins AND DR. Brunella Wileman [facility] PODIATRY DEPARTMENT   Take your medication as prescribed.  Pain medication should be taken only as needed.  May take Tylenol additionally between pain medication doses if needed.  Maximum dose of Tylenol or acetaminophen per day is 4000 mg.  Stable of this.  Keep the dressing clean, dry and intact.  Remain nonweightbearing to left lower extremity for the next 2 weeks.  Okay to rest her heel on the ground when standing but should not be putting full weight through the foot or ambulating for the next 2 weeks.  Keep your foot elevated above the heart level for the first 48 hours.  Continue elevation thereafter to improve swelling.  May also apply ice to the top of the left foot for maximum 10 minutes out of every 1 hour as needed.  Walking to the bathroom and brief periods of walking are acceptable, unless we have instructed you to be non-weight bearing.  Always wear your post-op shoe when walking.  Always use your crutches, knee scooter, wheelchair if you are to be non-weight bearing.  Do not take a shower. Baths are permissible as long as the foot is kept out of the water.  Keep dressing clean and dry.  Every hour you are awake:  Bend your knee 15 times. Massage calf 15 times  Call [facility] [phone]) if any of the following problems occur: You develop a temperature or fever. The bandage becomes saturated with blood. Medication does not stop your pain. Injury of the foot occurs. Any symptoms of infection including redness, odor, or red streaks running from wound.

## 2022-11-19 NOTE — Transfer of Care (Signed)
Immediate Anesthesia Transfer of Care Note  Patient: Stephanie Ball  Procedure(s) Performed: ARTHRODESIS METATARSALPHALANGEAL JOINT (MTPJ) (Left: Toe)  Patient Location: PACU  Anesthesia Type:General  Level of Consciousness: awake, alert , oriented, drowsy, and patient cooperative  Airway & Oxygen Therapy: Patient Spontanous Breathing  Post-op Assessment: Report given to RN, Post -op Vital signs reviewed and stable, and Patient moving all extremities X 4  Post vital signs: Reviewed and stable  Last Vitals:  Vitals Value Taken Time  BP 124/74 11/19/22 0911  Temp    Pulse 86 11/19/22 0912  Resp 13 11/19/22 0912  SpO2 100 % 11/19/22 0912  Vitals shown include unfiled device data.  Last Pain:  Vitals:   11/19/22 0647  TempSrc: Temporal  PainSc: 0-No pain         Complications: No notable events documented.

## 2022-11-19 NOTE — Anesthesia Postprocedure Evaluation (Signed)
Anesthesia Post Note  Patient: Stephanie Ball  Procedure(s) Performed: ARTHRODESIS METATARSALPHALANGEAL JOINT (MTPJ) (Left: Toe)  Patient location during evaluation: PACU Anesthesia Type: General Level of consciousness: awake and alert Pain management: pain level controlled Vital Signs Assessment: post-procedure vital signs reviewed and stable Respiratory status: spontaneous breathing, nonlabored ventilation, respiratory function stable and patient connected to nasal cannula oxygen Cardiovascular status: stable and blood pressure returned to baseline Postop Assessment: no apparent nausea or vomiting Anesthetic complications: no   No notable events documented.   Last Vitals:  Vitals:   11/19/22 0923 11/19/22 0932  BP: 136/83 137/83  Pulse: 92   Resp: 20 20  Temp: 36.4 C   SpO2: 100% 100%    Last Pain:  Vitals:   11/19/22 0932  TempSrc:   PainSc: 0-No pain                 [name] [name]

## 2022-11-19 NOTE — Anesthesia Procedure Notes (Signed)
Procedure Name: LMA Insertion Date/Time: 11/19/2022 7:32 AM  Performed by: Lanell Matar, CRNAPre-anesthesia Checklist: Patient identified, Emergency Drugs available, Suction available and Patient being monitored Patient Re-evaluated:Patient Re-evaluated prior to induction Oxygen Delivery Method: Circle System Utilized Preoxygenation: Pre-oxygenation with 100% oxygen Induction Type: IV induction Ventilation: Mask ventilation without difficulty LMA: LMA inserted LMA Size: 4.0 Number of attempts: 1 Airway Equipment and Method: Bite block Placement Confirmation: positive ETCO2 Tube secured with: Tape Dental Injury: Teeth and Oropharynx as per pre-operative assessment

## 2022-11-19 NOTE — H&P (Signed)
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  The various methods of treatment have been discussed with the patient.  No guarantees were given.  After consideration of risks, benefits and other options for treatment, the patient has consented to surgery.  I have reviewed the patients' chart and labs.    PROCEDURE: LEFT 1ST MTPJ FUSION  A history and physical examination was performed in my office.  The patient was reexamined.  There have been no changes to this history and physical examination.  Rosetta Posner, DPM

## 2022-11-19 NOTE — Op Note (Signed)
PODIATRY / FOOT AND ANKLE SURGERY OPERATIVE REPORT    SURGEON: Rosetta Posner, DPM  PRE-OPERATIVE DIAGNOSIS:  1.  Left hallux rigidus 2.  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  Severe arthritic changes present to the first metatarsal phalangeal joint with gouty arthritis and gout crystals present within the joint, minimal cartilage left  PATHOLOGY/SPECIMEN(S): None  INDICATIONS:   Stephanie Ball is a 67 y.o. female who presents with a painful left first metatarsal phalangeal joint.  Patient has exhausted conservative measures and still continues to have pain discomfort to the area.  Patient had x-ray imaging taken which revealed virtually bone-on-bone apposition of the first metatarsal phalangeal joint with spurs present around the joint overall.  All treatment options were discussed with the patient and patient's family both conservative and surgical attempts at correction include potential risks and complications at this time patient is elected for surgical intervention consisting of left first metatarsal phalange joint fusion.  No guarantees given.  Consent obtained.  Patient has discontinued smoking per patient as requested prior to surgery for at least 6 to 8 weeks now.  DESCRIPTION: After obtaining full informed written consent, the patient was brought back to the operating room and placed supine upon the operating table.  The patient received IV antibiotics prior to induction.  After obtaining adequate anesthesia, the patient was prepped and draped in the standard fashion.  10 cc of Exparel mixed with 10 cc of quarter percent Marcaine plain were injected about the left first ray in a Mayo type block and around the incisional area.  An Esmarch bandage was used to  exsanguinate the left lower extremity and the pneumatic ankle tourniquet was inflated.  Attention was directed to the left first metatarsal phalangeal joint area.  An incision was made medial to the tendon of the extensor hallucis longus involve the contour deformity at the first metatarsal phalangeal joint.  The incision was deepened through the subcutaneous tissues utilizing sharp and blunt dissection care was taken to identify and retract all vital neural and vascular structures and all venous contributories were cauterized as necessary.  At this time a capsular and periosteal incision was made medial to the tendon of the extensor hallucis longus and once again involve the contour of the deformity and was along the full length of the incision line.  The capsular periosteal tissue was reflected medially and laterally thereby exposing the first metatarsal phalangeal joint at the operative site.  There appeared to be a large amount of synovitic fluid that was exposed after making the incision into the joint.  There also appeared to be minimal to no cartilage present to her left at the first metatarsal phalange joint.  There appeared to be a dorsal exostosis as well as a large medial eminence.  The dorsal exostosis as well as any osteophytes were resected with a sagittal bone saw and rongeur.  The medial eminence was then resected with a sagittal bone saw as well.  The surgical site was flushed with copious amounts normal sterile saline.  At this time the guidewire for the reamer was then placed through the first metatarsal head and to the shaft of the metatarsal with the appropriate orientation.  The reamer was then used to ream off the remaining articular surface of the first metatarsal phalangeal joint to healthy bleeding subchondral bone.  The reamer was removed along with a guidewire and a curette and rongeur was used to remove any further residual scar type tissue/cartilage like tissue present to the area.   The same thing was then performed at the base the proximal phalanx of the hallux utilizing the guidewire and reamer.  The surgical site was inspected for any further remaining cartilage and fibrous tissue which was resected with a curette and rongeur.  The joint appeared to be adequately prepped overall.  A flush was performed with copious amounts normal sterile saline and this time a 2.0 drill bit was used to fenestrate the joint surface to complete the joint preparation.  The toe was then held in a rectus position and a temporary wire was then placed across the lateral base of the proximal phalanx across the first metatarsal phalange joint and into the first metatarsal head with the appropriate orientation.  The toe was held in a position such that the pulp of the toe barely made contact with the floor with simulated weightbearing and the first toe did not touch the second toe.  The toe appeared to sit in a rectus position in the frontal plane as well such that the nail was in the appropriate sagittal and frontal position.  Once temporary fixation was obtained the Paragon 28 0 degree first metatarsal phalange joint fusion plate was placed with the appropriate orientation and held temporarily in place with the olive wires.  The guide was then attached for the lag screw and at this time the wire was then placed through the appropriate portion of the guide such that the guidewire for the 3.5 partially-threaded headed Paragon 28 screw go through the base the proximal phalanx medially of the hallux across the first metatarsal phalange joint and into the first metatarsal head and neck area exiting the cortex.  Utilizing standard AO principles and techniques after wire was placed under fluoroscopic guidance and noted to sit appropriately a 3.5 x 26 mm partially-threaded headed Paragon 28 screw was placed across the fusion site with excellent compression.  The screws in the hallux were then placed utilizing standard  AO principles and techniques through the 3 locking holes and they were all 2.7 mm in length.  The olive wires were removed.  The most proximal nonlocking compression hole was then utilized utilizing a 3.5 x 16 mm nonlocking screw.  The plate appeared to sit appropriately overall with excellent compression across the first metatarsal phalange joint and the plate appeared to sit snug to the first metatarsal and proximal phalanx base.  The 2 remaining locking screws then placed into the first metatarsal that were 2.7 mm in length as well utilizing standard AO principles and techniques.  Final C-arm imaging was then taken showing rectus position of first metatarsal phalangeal joint with excellent compression across the joint with screws and hardware of the appropriate length and size.  A flush was performed with copious amounts normal sterile saline.  The capsular and periosteal tissues were reapproximated well coapted with 3-0 Vicryl.  The subcutaneous tissues were reapproximated well coapted with 4-0 Vicryl.  The skin was then reapproximated well coapted with a subcuticular stitch with 4-0 Monocryl.  Benzoin and Steri-Strips were applied.  The pneumatic ankle tourniquet was deflated and a prompt hyperemic response was noted to all digits of the left foot.  A postoperative dressing was then applied consisting of Xeroform followed by 4 x 4 gauze, gauze roll, Ace wrap, postop shoe with heel wedge.  Patient tolerated the  procedure and anesthesia well and was transferred to the recovery room with vital signs stable and vascular status intact all toes left foot.  Following a period of postoperative monitoring the patient be discharged home with the appropriate orders, instructions, and medications.  Patient is to remain nonweightbearing at all times left lower extremity for the next 2 weeks.  After that time may begin partial weightbearing with heel contact depending on how things look.  Patient to follow-up in 1 week for  further evaluation including dressing change and x-ray imaging.  COMPLICATIONS: None  CONDITION: Good, stable  Rosetta Posner, DPM

## 2022-11-23 ENCOUNTER — Other Ambulatory Visit: Payer: Self-pay | Admitting: Family Medicine

## 2022-11-23 DIAGNOSIS — F5101 Primary insomnia: Secondary | ICD-10-CM

## 2022-11-24 ENCOUNTER — Encounter: Payer: Self-pay | Admitting: Podiatry

## 2022-11-24 NOTE — Telephone Encounter (Signed)
Requested Prescriptions  Refused Prescriptions Disp Refills   traZODone (DESYREL) 100 MG tablet [Pharmacy Med Name: TRAZODONE 100 MG TABLET] 90 tablet     Sig: TAKE 1 TABLET (100 MG TOTAL) BY MOUTH AT BEDTIME AS NEEDED FOR FOR SLEEP.     Psychiatry: Antidepressants - Serotonin Modulator Passed - 11/23/2022  9:45 AM      Passed - Valid encounter within last 6 months    Recent Outpatient Visits           2 weeks ago Panic attacks   [facility] Jacky Kindle, FNP   2 months ago Annual physical exam   [facility] Jacky Kindle, FNP   11 months ago Annual physical exam   [facility] Jacky Kindle, FNP   1 year ago Primary hypertension   [facility] Jacky Kindle, FNP   1 year ago Primary hypertension   [facility] Jacky Kindle, FNP

## 2022-11-26 NOTE — Telephone Encounter (Signed)
[id number]) PA Case ID #: [id number] Rx #: [id number] Your PA request has been approved. Authorization Expiration Date: 11/26/2023 Zolpidem Tartrate 10MG  tablets. [facility].

## 2022-12-06 ENCOUNTER — Other Ambulatory Visit: Payer: Self-pay | Admitting: Family Medicine

## 2022-12-06 DIAGNOSIS — I10 Essential (primary) hypertension: Secondary | ICD-10-CM

## 2022-12-07 NOTE — Telephone Encounter (Signed)
Requested Prescriptions  Pending Prescriptions Disp Refills   hydrochlorothiazide (HYDRODIURIL) 12.5 MG tablet [Pharmacy Med Name: HYDROCHLOROTHIAZIDE 12.5 MG TB] 90 tablet 1    Sig: TAKE 1 TABLET BY MOUTH EVERY DAY     Cardiovascular: Diuretics - Thiazide Failed - 12/06/2022 10:55 AM      Failed - Cr in normal range and within 180 days    Creat  Date Value Ref Range Status  12/24/2020 1.57 (H) 0.50 - 1.05 mg/dL Final   Creatinine, Ser  Date Value Ref Range Status  11/16/2022 1.71 (H) 0.57 - 1.00 mg/dL Final         Passed - K in normal range and within 180 days    Potassium  Date Value Ref Range Status  11/16/2022 3.9 3.5 - 5.2 mmol/L Final         Passed - Na in normal range and within 180 days    Sodium  Date Value Ref Range Status  11/16/2022 142 134 - 144 mmol/L Final         Passed - Last BP in normal range    BP Readings from Last 1 Encounters:  11/19/22 137/83         Passed - Valid encounter within last 6 months    Recent Outpatient Visits           3 weeks ago Panic attacks   [facility] Jacky Kindle, FNP   3 months ago Annual physical exam   [facility] Jacky Kindle, FNP   11 months ago Annual physical exam   [facility] Jacky Kindle, FNP   1 year ago Primary hypertension   [facility] Jacky Kindle, FNP   1 year ago Primary hypertension    [facility] Jacky Kindle, [place]

## 2022-12-09 ENCOUNTER — Ambulatory Visit
Admission: RE | Admit: 2022-12-09 | Discharge: 2022-12-09 | Disposition: A | Discharge status: Home / Self Care | Payer: No Typology Code available for payment source | Source: Ambulatory Visit | Attending: Family Medicine | Admitting: Family Medicine

## 2022-12-09 DIAGNOSIS — N1832 Chronic kidney disease, stage 3b: Secondary | ICD-10-CM | POA: Insufficient documentation

## 2022-12-20 ENCOUNTER — Other Ambulatory Visit: Payer: Self-pay | Admitting: Family Medicine

## 2022-12-20 DIAGNOSIS — F5101 Primary insomnia: Secondary | ICD-10-CM

## 2022-12-29 ENCOUNTER — Other Ambulatory Visit: Payer: Self-pay | Admitting: Family Medicine

## 2022-12-29 DIAGNOSIS — F5101 Primary insomnia: Secondary | ICD-10-CM

## 2022-12-29 NOTE — Telephone Encounter (Signed)
Medication Refill - Medication: zolpidem (AMBIEN) 10 MG tablet   Has the patient contacted their pharmacy? No.   Preferred Pharmacy (with phone number or street name):  [id number] [address] Phone: [phone]  Fax: [phone]     Has the patient been seen for an appointment in the last year OR does the patient have an upcoming appointment? No.  Agent: Please be advised that RX refills may take up to 3 business days. We ask that you follow-up with your pharmacy.

## 2022-12-30 MED ORDER — ZOLPIDEM TARTRATE 10 MG PO TABS
5.0000 mg | ORAL_TABLET | Freq: Every evening | ORAL | 1 refills | Status: DC | PRN
Start: 2022-12-30 — End: 2023-02-12

## 2022-12-30 NOTE — Telephone Encounter (Signed)
Requested medication (s) are due for refill today:   Provider to review  Requested medication (s) are on the active medication list:   Yes  Future visit scheduled:   No    Last ordered: 07/21/2022 #30, 5 refills  Non delegated refill    Requested Prescriptions  Pending Prescriptions Disp Refills   zolpidem (AMBIEN) 10 MG tablet 30 tablet 5    Sig: Take 1 tablet (10 mg total) by mouth at bedtime as needed for sleep. Please schedule office visit before any future refill.     Not Delegated - Psychiatry:  Anxiolytics/Hypnotics Failed - 12/29/2022  5:58 PM      Failed - This refill cannot be delegated      Failed - Urine Drug Screen completed in last 360 days      Passed - Valid encounter within last 6 months    Recent Outpatient Visits           1 month ago Panic attacks   [facility] Jacky Kindle, FNP   3 months ago Annual physical exam   [facility] Jacky Kindle, FNP   1 year ago Annual physical exam   [facility] Jacky Kindle, FNP   1 year ago Primary hypertension   [place] Jacky Kindle, FNP   1 year ago Primary hypertension    [facility] Jacky Kindle, [place]

## 2023-01-05 DIAGNOSIS — N029 Recurrent and persistent hematuria with unspecified morphologic changes: Secondary | ICD-10-CM | POA: Insufficient documentation

## 2023-01-05 DIAGNOSIS — K219 Gastro-esophageal reflux disease without esophagitis: Secondary | ICD-10-CM | POA: Insufficient documentation

## 2023-01-26 ENCOUNTER — Other Ambulatory Visit: Payer: Self-pay | Admitting: Family Medicine

## 2023-01-26 DIAGNOSIS — I10 Essential (primary) hypertension: Secondary | ICD-10-CM

## 2023-01-26 DIAGNOSIS — F3170 Bipolar disorder, currently in remission, most recent episode unspecified: Secondary | ICD-10-CM

## 2023-01-26 DIAGNOSIS — F419 Anxiety disorder, unspecified: Secondary | ICD-10-CM

## 2023-01-27 NOTE — Telephone Encounter (Signed)
Requested Prescriptions  Pending Prescriptions Disp Refills   lisinopril (ZESTRIL) 40 MG tablet [Pharmacy Med Name: LISINOPRIL 40 MG TABLET] 90 tablet 0    Sig: TAKE 1 TABLET BY MOUTH EVERY DAY     Cardiovascular:  ACE Inhibitors Failed - 01/26/2023  6:42 PM      Failed - Cr in normal range and within 180 days    Creat  Date Value Ref Range Status  12/24/2020 1.57 (H) 0.50 - 1.05 mg/dL Final   Creatinine, Ser  Date Value Ref Range Status  11/16/2022 1.71 (H) 0.57 - 1.00 mg/dL Final         Passed - K in normal range and within 180 days    Potassium  Date Value Ref Range Status  11/16/2022 3.9 3.5 - 5.2 mmol/L Final         Passed - Patient is not pregnant      Passed - Last BP in normal range    BP Readings from Last 1 Encounters:  11/19/22 137/83         Passed - Valid encounter within last 6 months    Recent Outpatient Visits           2 months ago Panic attacks   [facility] Jacky Kindle, FNP   4 months ago Annual physical exam   [facility] Jacky Kindle, FNP   1 year ago Annual physical exam   [facility] Jacky Kindle, FNP   1 year ago Primary hypertension   [facility] [facility] Merita Norton T, FNP   1 year ago Primary hypertension   [place] [facility] Merita Norton T, FNP               busPIRone (BUSPAR) 30 MG tablet [Pharmacy Med Name: BUSPIRONE HCL 30 MG TABLET] 180 tablet 0    Sig: TAKE 1 TABLET BY MOUTH TWICE A DAY     Psychiatry: Anxiolytics/Hypnotics - Non-controlled Passed - 01/26/2023  6:42 PM      Passed - Valid encounter within last 12 months    Recent Outpatient Visits           2 months ago Panic attacks   [facility] Jacky Kindle, FNP   4 months ago Annual physical exam   [facility] Jacky Kindle, FNP   1 year ago Annual physical exam   [facility] Jacky Kindle, FNP   1 year ago Primary hypertension   [facility] Merita Norton T, FNP   1 year ago Primary hypertension   [facility] Merita Norton T, FNP               venlafaxine (EFFEXOR) 75 MG tablet [Pharmacy Med Name: VENLAFAXINE HCL 75 MG TABLET] 270 tablet 0    Sig: TAKE 1 TABLET (75 MG TOTAL) BY MOUTH 3 (THREE) TIMES DAILY WITH MEALS.     Psychiatry: Antidepressants - SNRI - desvenlafaxine & venlafaxine Failed - 01/26/2023  6:42 PM      Failed - Cr in normal range and within 360 days    Creat  Date Value Ref Range Status  12/24/2020 1.57 (H) 0.50 - 1.05 mg/dL Final   Creatinine, Ser  Date Value Ref Range Status  11/16/2022 1.71 (H) 0.57 - 1.00 mg/dL Final  Failed - Lipid Panel in normal range within the last 12 months    Cholesterol, Total  Date Value Ref Range Status  11/09/2018 194 100 - 199 mg/dL Final   Cholesterol  Date Value Ref Range Status  09/15/2022 153 0 - 200 Final   LDL Cholesterol (Calc)  Date Value Ref Range Status  12/24/2020 98 mg/dL (calc) Final    Comment:    Reference range: <100 . Desirable range <100 mg/dL for primary prevention;   <70 mg/dL for patients with CHD or diabetic patients  with > or = 2 CHD risk factors. Marland Kitchen LDL-C is now calculated using the Martin-Hopkins  calculation, which is a validated novel method providing  better accuracy than the Friedewald equation in the  estimation of LDL-C.  Horald Pollen et al. Lenox Ahr. 4098;119(14): 2061-2068  (http://education.[facility].com/faq/FAQ164)    LDL Cholesterol  Date Value Ref Range Status  09/15/2022 64  Final   HDL  Date Value Ref Range Status  09/15/2022 74 (A) 35 - 70 Final  11/09/2018 42 >39 mg/dL Final   Triglycerides  Date Value Ref Range Status  09/15/2022 69 40 - 160 Final         Passed - Last BP in normal range    BP Readings from Last 1 Encounters:  11/19/22 137/83          Passed - Valid encounter within last 6 months    Recent Outpatient Visits           2 months ago Panic attacks   [place] [facility] Jacky Kindle, FNP   4 months ago Annual physical exam   [facility] Jacky Kindle, FNP   1 year ago Annual physical exam   [place] [facility] Merita Norton T, FNP   1 year ago Primary hypertension   [place] [facility] Merita Norton T, FNP   1 year ago Primary hypertension    [facility] Merita Norton T, FNP               rosuvastatin (CRESTOR) 20 MG tablet [Pharmacy Med Name: ROSUVASTATIN CALCIUM 20 MG TAB] 90 tablet 0    Sig: TAKE 1 TABLET BY MOUTH EVERY DAY     Cardiovascular:  Antilipid - Statins 2 Failed - 01/26/2023  6:42 PM      Failed - Cr in normal range and within 360 days    Creat  Date Value Ref Range Status  12/24/2020 1.57 (H) 0.50 - 1.05 mg/dL Final   Creatinine, Ser  Date Value Ref Range Status  11/16/2022 1.71 (H) 0.57 - 1.00 mg/dL Final         Failed - Lipid Panel in normal range within the last 12 months    Cholesterol, Total  Date Value Ref Range Status  11/09/2018 194 100 - 199 mg/dL Final   Cholesterol  Date Value Ref Range Status  09/15/2022 153 0 - 200 Final   LDL Cholesterol (Calc)  Date Value Ref Range Status  12/24/2020 98 mg/dL (calc) Final    Comment:    Reference range: <100 . Desirable range <100 mg/dL for primary prevention;   <70 mg/dL for patients with CHD or diabetic patients  with > or = 2 CHD risk factors. Marland Kitchen LDL-C is now calculated using the Martin-Hopkins  calculation, which is a validated novel method providing  better accuracy than the Friedewald equation in the  estimation of LDL-C.  Horald Pollen  et al. JAMA. 7829;562(13): 2061-2068  (http://education.[facility].com/faq/FAQ164)    LDL Cholesterol  Date Value Ref Range Status  09/15/2022 64  Final   HDL   Date Value Ref Range Status  09/15/2022 74 (A) 35 - 70 Final  11/09/2018 42 >39 mg/dL Final   Triglycerides  Date Value Ref Range Status  09/15/2022 69 40 - 160 Final         Passed - Patient is not pregnant      Passed - Valid encounter within last 12 months    Recent Outpatient Visits           2 months ago Panic attacks   [place] [facility] Jacky [facility], FNP   4 months ago Annual physical exam   [facility] Jacky [facility], FNP   1 year ago Annual physical exam   [facility] Jacky Kindle, FNP   1 year ago Primary hypertension   [place] [facility] Jacky Kindle, FNP   1 year ago Primary hypertension   [facility] Jacky Kindle, [place]

## 2023-02-01 ENCOUNTER — Emergency Department: Payer: No Typology Code available for payment source

## 2023-02-01 ENCOUNTER — Emergency Department
Admission: EM | Admit: 2023-02-01 | Discharge: 2023-02-01 | Disposition: A | Discharge status: Home / Self Care | Payer: No Typology Code available for payment source | Attending: Emergency Medicine | Admitting: Emergency Medicine

## 2023-02-01 DIAGNOSIS — I129 Hypertensive chronic kidney disease with stage 1 through stage 4 chronic kidney disease, or unspecified chronic kidney disease: Secondary | ICD-10-CM | POA: Diagnosis not present

## 2023-02-01 DIAGNOSIS — N189 Chronic kidney disease, unspecified: Secondary | ICD-10-CM | POA: Insufficient documentation

## 2023-02-01 DIAGNOSIS — K922 Gastrointestinal hemorrhage, unspecified: Secondary | ICD-10-CM | POA: Diagnosis not present

## 2023-02-01 DIAGNOSIS — K625 Hemorrhage of anus and rectum: Secondary | ICD-10-CM | POA: Diagnosis present

## 2023-02-01 DIAGNOSIS — R1084 Generalized abdominal pain: Secondary | ICD-10-CM

## 2023-02-01 LAB — COMPREHENSIVE METABOLIC PANEL
ALT: 13 U/L (ref 0–44)
AST: 19 U/L (ref 15–41)
Albumin: 3.8 g/dL (ref 3.5–5.0)
Alkaline Phosphatase: 87 U/L (ref 38–126)
Anion gap: 11 (ref 5–15)
BUN: 21 mg/dL (ref 8–23)
CO2: 20 mmol/L — ABNORMAL LOW (ref 22–32)
Calcium: 10.8 mg/dL — ABNORMAL HIGH (ref 8.9–10.3)
Chloride: 105 mmol/L (ref 98–111)
Creatinine, Ser: 2.02 mg/dL — ABNORMAL HIGH (ref 0.44–1.00)
GFR, Estimated: 27 mL/min — ABNORMAL LOW (ref >60)
Glucose, Bld: 140 mg/dL — ABNORMAL HIGH (ref 70–99)
Potassium: 3.2 mmol/L — ABNORMAL LOW (ref 3.5–5.1)
Sodium: 136 mmol/L (ref 135–145)
Total Bilirubin: 1.2 mg/dL (ref 0.3–1.2)
Total Protein: 7.4 g/dL (ref 6.5–8.1)

## 2023-02-01 LAB — CBC
HCT: 40 % (ref 36.0–46.0)
Hemoglobin: 14.4 g/dL (ref 12.0–15.0)
MCH: 29.1 pg (ref 26.0–34.0)
MCHC: 36 g/dL (ref 30.0–36.0)
MCV: 80.8 fL (ref 80.0–100.0)
Platelets: 307 10*3/uL (ref 150–400)
RBC: 4.95 MIL/uL (ref 3.87–5.11)
RDW: 14.5 % (ref 11.5–15.5)
WBC: 24 10*3/uL — ABNORMAL HIGH (ref 4.0–10.5)
nRBC: 0 % (ref 0.0–0.2)

## 2023-02-01 LAB — TYPE AND SCREEN
ABO/RH(D): O POS
Antibody Screen: NEGATIVE

## 2023-02-01 LAB — LIPASE, BLOOD: Lipase: 27 U/L (ref 11–51)

## 2023-02-01 MED ORDER — LACTATED RINGERS IV BOLUS
1000.0000 mL | Freq: Once | INTRAVENOUS | Status: AC
  Administered 2023-02-01: 1000 mL via INTRAVENOUS

## 2023-02-01 MED ORDER — ONDANSETRON HCL 4 MG PO TABS: 4.0000 mg | ORAL_TABLET | Freq: Every day | ORAL | 0 refills | Status: DC | PRN

## 2023-02-01 MED ORDER — MORPHINE SULFATE (PF) 4 MG/ML IV SOLN
4.0000 mg | Freq: Once | INTRAVENOUS | Status: AC
  Administered 2023-02-01: 4 mg via INTRAVENOUS
  Filled 2023-02-01: qty 1

## 2023-02-01 MED ORDER — ONDANSETRON HCL 4 MG/2ML IJ SOLN
4.0000 mg | Freq: Once | INTRAMUSCULAR | Status: AC
  Administered 2023-02-01: 4 mg via INTRAVENOUS
  Filled 2023-02-01: qty 2

## 2023-02-01 NOTE — Discharge Instructions (Signed)
You appear to have some inflammation around your bowels which can cause the abdominal pain, nausea, and occasionally bloody diarrhea.  Thankfully no antibiotics or surgery needed at this time.  Please follow-up with your PCP for reevaluation.  I have sent nausea medication to your pharmacy for you to take as needed to help with those symptoms.  Please return if you have worsening dehydration symptoms from recurrent vomiting and diarrhea.  Separately, you are found to have a cyst on the right kidney.  Nothing emergent needs to be done with this but follow-up with your kidney doctor to discuss outpatient ultrasound.

## 2023-02-01 NOTE — ED Provider Notes (Addendum)
  Physical Exam  BP (!) 133/90   Pulse (!) 104   Temp 98.3 F (36.8 C) (Oral)   Resp 16   SpO2 100%   Physical Exam I have reviewed the vital signs. General:  Awake, alert, no acute distress. Head:  Normocephalic, Atraumatic. EENT:  PERRL, EOMI, Oral mucosa pink and moist, Neck is supple. Cardiovascular: Regular rate, 2+ distal pulses. Respiratory:  Normal respiratory effort, symmetrical expansion, no distress.   Extremities:  Moving all four extremities through full ROM without pain.   Neuro:  Alert and oriented.  Interacting appropriately.   Skin:  Warm, dry, no rash.   Psych: Appropriate affect.   Procedures  Procedures  ED Course / MDM    Medical Decision Making Amount and/or Complexity of Data Reviewed Labs: ordered. Radiology: ordered.  Risk Prescription drug management.   Received patient in signout.  67 year old female presenting today for complaint of rectal bleeding.  Has had nausea, vomiting, and diarrheal episodes.  Has generalized abdominal pain but denies any recent fevers or hematemesis.  Laboratory workup by previous provider was overall reassuring aside from mild hypokalemia and leukocytosis of undetermined source.  Patient was signed out following CT abdomen/pelvis results.  Patient symptoms largely resolved after medication administration and she did not have any additional bloody bowel movements.  CT abdomen/pelvis shows evidence of infectious versus inflammatory colitis which would match up with her symptoms today.  No other acute findings.  Separately was noted to have concerns for a hyperdense cyst noted on the right kidney.  Patient was made aware of this and will have plan for outpatient ultrasound for further evaluation.  Reassessed after CT with no abdominal pain or nausea.  Patient was offered admission to the hospital again but she would still like to go home at this time.  Sent home with Zofran and told to follow-up with PCP.       Janith Lima, MD 02/01/23 1539    Janith Lima, MD 02/01/23 [phone]

## 2023-02-01 NOTE — ED Provider Notes (Signed)
[facility] Provider Note    Event Date/Time   First MD Initiated Contact with Patient 02/01/23 1047     (approximate)   History   Chief Complaint Rectal Bleeding   HPI  Korbin Ragin is a 67 y.o. female with past medical history of hypertension, hyperlipidemia, CKD, and bipolar disorder who presents to the ED complaining of rectal bleeding.  Patient reports that since yesterday evening she has been dealing with nausea, vomiting, and multiple episodes of diarrhea.  She states that she initially just had mucousy and watery stool, but has since begun to notice bright red blood from her rectum.  She states this is not mixed into her stool and she denies any dark tarry stool.  She has not noted any blood in her emesis and denies any fevers.  She does report diffuse abdominal pain.  She does not take any blood thinners, denies any history of GI bleeding.      Physical Exam   Triage Vital Signs: ED Triage Vitals [02/01/23 1042]  Encounter Vitals Group     BP (!) 123/93     Systolic BP Percentile      Diastolic BP Percentile      Pulse Rate (!) 115     Resp 20     Temp 98.3 F (36.8 C)     Temp Source Oral     SpO2 99 %     Weight      Height      Head Circumference      Peak Flow      Pain Score      Pain Loc      Pain Education      Exclude from Growth Chart     Most recent vital signs: Vitals:   02/01/23 1400 02/01/23 1430  BP: 139/80 (!) 133/90  Pulse: (!) 111 (!) 104  Resp:    Temp:    SpO2: 100% 100%    Constitutional: Alert and oriented. Eyes: Conjunctivae are normal. Head: Atraumatic. Nose: No congestion/rhinnorhea. Mouth/Throat: Mucous membranes are moist.  Cardiovascular: Tachycardic, regular rhythm. Grossly normal heart sounds.  2+ radial pulses bilaterally. Respiratory: Normal respiratory effort.  No retractions. Lungs CTAB. Gastrointestinal: Soft and diffusely tender to palpation with no rebound or guarding. No  distention.  Rectal exam with maroon bloody stool.  Hemorrhoid noted with no active bleeding. Musculoskeletal: No lower extremity tenderness nor edema.  Neurologic:  Normal speech and language. No gross focal neurologic deficits are appreciated.    ED Results / Procedures / Treatments   Labs (all labs ordered are listed, but only abnormal results are displayed) Labs Reviewed  COMPREHENSIVE METABOLIC PANEL - Abnormal; Notable for the following components:      Result Value   Potassium 3.2 (*)    CO2 20 (*)    Glucose, Bld 140 (*)    Creatinine, Ser 2.02 (*)    Calcium 10.8 (*)    GFR, Estimated 27 (*)    All other components within normal limits  CBC - Abnormal; Notable for the following components:   WBC 24.0 (*)    All other components within normal limits  LIPASE, BLOOD  URINALYSIS, ROUTINE W REFLEX MICROSCOPIC  POC OCCULT BLOOD, ED  TYPE AND SCREEN   RADIOLOGY CT abdomen/pelvis reviewed and interpreted by me with no inflammatory changes, focal fluid collections, or dilated bowel loops.  PROCEDURES:  Critical Care performed: No  Procedures   MEDICATIONS ORDERED IN ED: Medications  lactated  ringers bolus 1,000 mL (0 mLs Intravenous Stopped 02/01/23 1220)  morphine (PF) 4 MG/ML injection 4 mg (4 mg Intravenous Given 02/01/23 1110)  ondansetron (ZOFRAN) injection 4 mg (4 mg Intravenous Given 02/01/23 1108)     IMPRESSION / MDM / ASSESSMENT AND PLAN / ED COURSE  I reviewed the triage vital signs and the nursing notes.                              67 y.o. female with past medical history of hypertension, hyperlipidemia, CKD, and bipolar disorder who presents to the ED complaining of nausea, vomiting, diarrhea, rectal bleeding, and abdominal pain since yesterday evening  Patient's presentation is most consistent with acute presentation with potential threat to life or bodily function.  Differential diagnosis includes, but is not limited to, upper GI bleed, lower GI  bleed, colitis, diverticulitis, bleeding hemorrhoids, gastroenteritis, electrolyte abnormality, AKI.  Patient nontoxic-appearing and in no acute distress, vital signs remarkable for tachycardia but otherwise reassuring.  Patient's abdomen is soft but she does have diffuse tenderness, rectal exam with gross blood, does not appear to be from a hemorrhoid.  Labs are pending at this time, we will check CT of her abdomen/pelvis, treat symptomatically with IV morphine and Zofran, hydrate with IV fluids.  Labs show mild AKI on top of known chronic kidney disease, no acute electrolyte abnormality noted.  LFTs and lipase are unremarkable.  She does have a significant leukocytosis, however hemoglobin stable compared to previous.  CT results are pending at this time, I did reassess the patient and she has not passed any further blood here in the ED.  She was offered admission to the hospital regardless of CT results, but declines.  She states she prefers to go home if the CT is unremarkable.  Patient turned over to oncoming divider pending CT results and reassessment.      FINAL CLINICAL IMPRESSION(S) / ED DIAGNOSES   Final diagnoses:  Generalized abdominal pain  Lower GI bleed     Rx / DC Orders   ED Discharge Orders     None        Note:  This document was prepared using Dragon voice recognition software and may include unintentional dictation errors.   Chesley Noon, MD 02/01/23 [phone]

## 2023-02-01 NOTE — ED Triage Notes (Signed)
Pt to ED via POV from home. Pt reports started feeling bad yesterday and thought it was the stomach virus. Pt reports noticed some bright red blood from. Pt endorses nausea and abdominal pain.

## 2023-02-01 NOTE — ED Notes (Signed)
Pt to CT

## 2023-02-09 ENCOUNTER — Telehealth: Payer: Self-pay | Admitting: Physician Assistant

## 2023-02-09 NOTE — Telephone Encounter (Signed)
Received incoming referral from the ED for the patient, the patient called in to schedule an appointment. I sent out an appointment reminder with a No-Show letter and a map. I advise her of the $50 Co-Pay

## 2023-02-10 ENCOUNTER — Ambulatory Visit: Payer: Self-pay | Admitting: *Deleted

## 2023-02-10 ENCOUNTER — Other Ambulatory Visit: Payer: Self-pay | Admitting: Family Medicine

## 2023-02-10 MED ORDER — PEG 3350-KCL-NA BICARB-NACL 420 G PO SOLR: 4000.0000 mL | Freq: Once | ORAL | 0 refills | Status: AC

## 2023-02-10 NOTE — Telephone Encounter (Signed)
Patient advised. Verbalized understanding 

## 2023-02-10 NOTE — Telephone Encounter (Signed)
  Chief Complaint: left side abdominal pain , constipation Symptoms: left abdominal pain , constant  pain level at 5, constipation no BM since last Tuesday . No vomiting last seen ED 02/01/23. Rectal bleeding with last BM.  Frequency: since 02/01/23 Pertinent Negatives: Patient denies fever, no severe pain, no vomiting no diarrhea. No dizziness  Disposition: [] ED /[] Urgent Care (no appt availability in office) / [] Appointment(In office/virtual)/ []  [facility]/ [] Home Care/ [] Refused Recommended Disposition /[] [facility]/ [x]  Follow-up with PCP Additional Notes:   Recommended care advise regarding constipation , drink fluids , warm fluids, small meals if eating , ambulate to promote BM. If pain continues to worsen go back to ED. Please advise if regular OV can be scheduled today. Already has hospital f/u appt for 02/17/23       Reason for Disposition  Age > 60 years  Answer Assessment - Initial Assessment Questions 1. LOCATION: "Where does it hurt?"      Left side constant pain  2. RADIATION: "Does the pain shoot anywhere else?" (e.g., chest, back)     No  3. ONSET: "When did the pain begin?" (e.g., minutes, hours or days ago)      On going has been seen in ED 4. SUDDEN: "Gradual or sudden onset?"     Na  5. PATTERN "Does the pain come and go, or is it constant?"    - If it comes and goes: "How long does it last?" "Do you have pain now?"     (Note: Comes and goes means the pain is intermittent. It goes away completely between bouts.)    - If constant: "Is it getting better, staying the same, or getting worse?"      (Note: Constant means the pain never goes away completely; most serious pain is constant and gets worse.)      Constant pain no severe 6. SEVERITY: "How bad is the pain?"  (e.g., Scale 1-10; mild, moderate, or severe)    - MILD (1-3): Doesn't interfere with normal activities, abdomen soft and not tender to touch.     - MODERATE (4-7): Interferes with  normal activities or awakens from sleep, abdomen tender to touch.     - SEVERE (8-10): Excruciating pain, doubled over, unable to do any normal activities.       Pain level 5 constant pain  7. RECURRENT SYMPTOM: "Have you ever had this type of stomach pain before?" If Yes, ask: "When was the last time?" and "What happened that time?"      Yes last seen in ED with vomiting  8. CAUSE: "What do you think is causing the stomach pain?"     Possible constipation 9. RELIEVING/AGGRAVATING FACTORS: "What makes it better or worse?" (e.g., antacids, bending or twisting motion, bowel movement)     Na  10. OTHER SYMPTOMS: "Do you have any other symptoms?" (e.g., back pain, diarrhea, fever, urination pain, vomiting)       Left abdominal pain , nausea no vomiting at this time . Constipation last BM last Tuesday  11. PREGNANCY: "Is there any chance you are pregnant?" "When was your last menstrual period?"       na  Protocols used: Abdominal Pain - [facility]

## 2023-02-11 ENCOUNTER — Telehealth: Payer: Self-pay | Admitting: Family Medicine

## 2023-02-11 NOTE — Telephone Encounter (Signed)
Pts husband is calling to report that zolpidem (AMBIEN) 10 MG tablet [[id number]] sig is written as  Take 0.5 tablets (5 mg total) by mouth at bedtime as needed for sleep. Patient is taking 1 pill as needed for sleep. Pharmacy is requesting a change in the sig. Please advise CB- [phone]

## 2023-02-12 ENCOUNTER — Other Ambulatory Visit: Payer: Self-pay | Admitting: Family Medicine

## 2023-02-12 DIAGNOSIS — F5101 Primary insomnia: Secondary | ICD-10-CM

## 2023-02-12 MED ORDER — ZOLPIDEM TARTRATE 5 MG PO TABS: 5.0000 mg | ORAL_TABLET | Freq: Every evening | ORAL | 1 refills | Status: DC | PRN

## 2023-02-17 ENCOUNTER — Encounter: Payer: Self-pay | Admitting: Family Medicine

## 2023-02-17 ENCOUNTER — Ambulatory Visit (INDEPENDENT_AMBULATORY_CARE_PROVIDER_SITE_OTHER): Payer: No Typology Code available for payment source | Admitting: Family Medicine

## 2023-02-17 ENCOUNTER — Other Ambulatory Visit: Payer: Self-pay

## 2023-02-17 VITALS — BP 131/81 | HR 96 | Ht 62.0 in | Wt 160.1 lb

## 2023-02-17 DIAGNOSIS — Z09 Encounter for follow-up examination after completed treatment for conditions other than malignant neoplasm: Secondary | ICD-10-CM

## 2023-02-17 DIAGNOSIS — Z87891 Personal history of nicotine dependence: Secondary | ICD-10-CM

## 2023-02-17 DIAGNOSIS — Z122 Encounter for screening for malignant neoplasm of respiratory organs: Secondary | ICD-10-CM

## 2023-02-17 DIAGNOSIS — F17200 Nicotine dependence, unspecified, uncomplicated: Secondary | ICD-10-CM | POA: Insufficient documentation

## 2023-02-17 DIAGNOSIS — K529 Noninfective gastroenteritis and colitis, unspecified: Secondary | ICD-10-CM | POA: Diagnosis not present

## 2023-02-17 DIAGNOSIS — N179 Acute kidney failure, unspecified: Secondary | ICD-10-CM

## 2023-02-17 DIAGNOSIS — Z1211 Encounter for screening for malignant neoplasm of colon: Secondary | ICD-10-CM

## 2023-02-17 DIAGNOSIS — F17210 Nicotine dependence, cigarettes, uncomplicated: Secondary | ICD-10-CM

## 2023-02-17 DIAGNOSIS — R7303 Prediabetes: Secondary | ICD-10-CM | POA: Insufficient documentation

## 2023-02-17 DIAGNOSIS — F5101 Primary insomnia: Secondary | ICD-10-CM

## 2023-02-17 MED ORDER — TRAZODONE HCL 100 MG PO TABS
200.0000 mg | ORAL_TABLET | Freq: Every day | ORAL | 3 refills | Status: DC
Start: 2023-02-17 — End: 2023-07-09

## 2023-02-17 NOTE — Progress Notes (Signed)
Established patient visit   Patient: Stephanie Ball   DOB: 05/31/1955   67 y.o. Female  MRN: [id number] Visit Date: 02/17/2023  Today's healthcare provider: Jacky Kindle, FNP  Introduced to nurse practitioner role and practice setting.  All questions answered.  Discussed provider/patient relationship and expectations.  Subjective    HPI HPI     Hospitalization Follow-up    Additional comments: Patient seen at [facility] on 02/01/23 for generalized abdominal pain. Patient reports she is feeling good with no more pain and bleeding.       Last edited by Acey Lav, CMA on 02/17/2023  9:49 AM.     The patient, with a history of abdominal pain and bloody stools, presents for a follow-up after a recent hospital visit. The abdominal pain has since resolved, and there is no further report of bloody stools. The patient has a history of constipation, requiring laxatives for bowel movements. The patient was scheduled for a colonoscopy but was unable to complete the preparation due to vomiting. The patient reports no current nausea.  The patient also reports issues with sleep and has been prescribed sleeping pills. However, the dosage has been reduced due to the patient's age, which has caused some concern. The patient is a current smoker, having quit for a period of twelve weeks previously due to a need for surgery.  Medications: Outpatient Medications Prior to Visit  Medication Sig   amLODipine (NORVASC) 5 MG tablet TAKE 1 TABLET (5 MG TOTAL) BY MOUTH DAILY.   busPIRone (BUSPAR) 30 MG tablet TAKE 1 TABLET BY MOUTH TWICE A DAY   clotrimazole-betamethasone (LOTRISONE) cream APPLY TOPICALLY 2 TIMES DAILY   colchicine 0.6 MG tablet Take 2 tabs PO at onset of symptoms, and one tab PO daily until symptoms resolve. Repeat as needed   doxycycline (ADOXA) 100 MG tablet Take 1 tablet (100 mg total) by mouth 2 (two) times daily.   famotidine (PEPCID) 20 MG tablet Take 20 mg by mouth 2 (two) times  daily.   hydrOXYzine (ATARAX) 25 MG tablet TAKE 1 TABLET BY MOUTH THREE TIMES A DAY AS NEEDED   lisinopril (ZESTRIL) 40 MG tablet TAKE 1 TABLET BY MOUTH EVERY DAY   lovastatin (MEVACOR) 40 MG tablet Take 40 mg by mouth at bedtime.   montelukast (SINGULAIR) 10 MG tablet TAKE 1 TABLET BY MOUTH EVERY DAY   OLANZapine (ZYPREXA) 15 MG tablet TAKE 1 TABLET (15 MG TOTAL) BY MOUTH DAILY.   propranolol (INDERAL) 20 MG tablet Take 1 tablet (20 mg total) by mouth daily as needed (anxiety).   rosuvastatin (CRESTOR) 20 MG tablet TAKE 1 TABLET BY MOUTH EVERY DAY   venlafaxine (EFFEXOR) 75 MG tablet TAKE 1 TABLET (75 MG TOTAL) BY MOUTH 3 (THREE) TIMES DAILY WITH MEALS.   zolpidem (AMBIEN) 5 MG tablet Take 1 tablet (5 mg total) by mouth at bedtime as needed for sleep.   [DISCONTINUED] traZODone (DESYREL) 100 MG tablet TAKE 1 TABLET (100 MG TOTAL) BY MOUTH AT BEDTIME AS NEEDED FOR FOR SLEEP.   hydrochlorothiazide (HYDRODIURIL) 12.5 MG tablet TAKE 1 TABLET BY MOUTH EVERY DAY   ondansetron (ZOFRAN) 4 MG tablet Take 1 tablet (4 mg total) by mouth daily as needed.   Vitamin D, Ergocalciferol, (DRISDOL) 1.25 MG (50000 UNIT) CAPS capsule TAKE 1 CAPSULE (50,000 UNITS TOTAL) BY MOUTH EVERY 7 (SEVEN) DAYS   No facility-administered medications prior to visit.     Objective    BP 131/81 (BP Location: Right Arm, Patient Position:  Sitting, Cuff Size: Normal)   Pulse 96   Ht 5\' 2"  (1.575 m)   Wt 160 lb 1.6 oz (72.6 kg)   SpO2 100%   BMI 29.28 kg/m   Physical Exam Vitals and nursing note reviewed.  Constitutional:      General: She is not in acute distress.    Appearance: Normal appearance. She is overweight. She is not ill-appearing, toxic-appearing or diaphoretic.  HENT:     Head: Normocephalic and atraumatic.  Cardiovascular:     Rate and Rhythm: Normal rate and regular rhythm.     Pulses: Normal pulses.     Heart sounds: Normal heart sounds. No murmur heard.    No friction rub. No gallop.  Pulmonary:      Effort: Pulmonary effort is normal. No respiratory distress.     Breath sounds: Normal breath sounds. No stridor. No wheezing, rhonchi or rales.  Chest:     Chest wall: No tenderness.  Abdominal:     General: Bowel sounds are normal. There is no distension.     Palpations: Abdomen is soft.     Tenderness: There is no abdominal tenderness. There is no guarding or rebound.     Hernia: No hernia is present.     Comments: Endorses chronic constipation; denies any rectal bleeding   Musculoskeletal:        General: No swelling, tenderness, deformity or signs of injury. Normal range of motion.     Right lower leg: No edema.     Left lower leg: No edema.  Skin:    General: Skin is warm and dry.     Capillary Refill: Capillary refill takes less than 2 seconds.     Coloration: Skin is not jaundiced or pale.     Findings: No bruising, erythema, lesion or rash.  Neurological:     General: No focal deficit present.     Mental Status: She is alert and oriented to person, place, and time. Mental status is at baseline.     Cranial Nerves: No cranial nerve deficit.     Sensory: No sensory deficit.     Motor: No weakness.     Coordination: Coordination normal.  Psychiatric:        Mood and Affect: Mood normal.        Behavior: Behavior normal.        Thought Content: Thought content normal.        Judgment: Judgment normal.     No results found for any visits on 02/17/23.  Assessment & Plan     Problem List Items Addressed This Visit       Digestive   Colitis     Genitourinary   AKI (acute kidney injury) (HCC)   Relevant Orders   CBC with Differential/Platelet   Comprehensive Metabolic Panel (CMET)     Other   Hospital discharge follow-up - Primary   Insomnia   Relevant Medications   traZODone (DESYREL) 100 MG tablet   Prediabetes   Relevant Orders   Hemoglobin A1c   Screening for colon cancer   Relevant Orders   Cologuard   Tobacco dependence  Abdominal Pain and  Hematochezia Resolved abdominal pain and no ongoing hematochezia. Patient had difficulty tolerating Rx treatment for constipation. Scheduled for GI consultation. -Continue with GI consultation on 02/16/2023.  Chronic Constipation Ongoing issue with bowel movements once a week, with assistance. -Continue with GI consultation on 02/16/2023.  Medication Adjustment Concerns about reduction in sleeping pill dosage due to  age. Wendall Mola criteria and rationale for dose reduction. -Refill Trazodone at previous dose per patient request. -Continue age adjusted ambien.   Smoking Patient resumed smoking after a period of cessation. -Encourage smoking cessation.  General Health Maintenance -Repeat metabolic panel to assess kidney function and potassium levels. -Check blood sugar average to assess for prediabetes. -Order low-dose lung CT scan. -Address vaccination status for pneumonia and shingles.  Return if symptoms worsen or fail to improve.     Leilani Merl, FNP, have reviewed all documentation for this visit. The documentation on 02/17/23 for the exam, diagnosis, procedures, and orders are all accurate and complete.  Jacky Kindle, FNP  [facility] [phone] (phone) [phone] (fax)  [facility]

## 2023-02-18 ENCOUNTER — Other Ambulatory Visit: Payer: Self-pay | Admitting: Family Medicine

## 2023-02-18 ENCOUNTER — Other Ambulatory Visit: Payer: Self-pay

## 2023-02-18 DIAGNOSIS — E876 Hypokalemia: Secondary | ICD-10-CM

## 2023-02-18 DIAGNOSIS — E860 Dehydration: Secondary | ICD-10-CM

## 2023-02-18 LAB — COMPREHENSIVE METABOLIC PANEL
ALT: 7 [IU]/L (ref 0–32)
AST: 13 [IU]/L (ref 0–40)
Albumin: 3.9 g/dL (ref 3.9–4.9)
Alkaline Phosphatase: 105 [IU]/L (ref 44–121)
BUN/Creatinine Ratio: 9 — ABNORMAL LOW (ref 12–28)
BUN: 14 mg/dL (ref 8–27)
Bilirubin Total: 0.5 mg/dL (ref 0.0–1.2)
CO2: 22 mmol/L (ref 20–29)
Calcium: 10.3 mg/dL (ref 8.7–10.3)
Chloride: 108 mmol/L — ABNORMAL HIGH (ref 96–106)
Creatinine, Ser: 1.54 mg/dL — ABNORMAL HIGH (ref 0.57–1.00)
Globulin, Total: 2.5 g/dL (ref 1.5–4.5)
Glucose: 116 mg/dL — ABNORMAL HIGH (ref 70–99)
Potassium: 3 mmol/L — ABNORMAL LOW (ref 3.5–5.2)
Sodium: 145 mmol/L — ABNORMAL HIGH (ref 134–144)
Total Protein: 6.4 g/dL (ref 6.0–8.5)
eGFR: 37 mL/min/{1.73_m2} — ABNORMAL LOW (ref >59)

## 2023-02-18 LAB — CBC WITH DIFFERENTIAL/PLATELET
Basophils Absolute: 0 10*3/uL (ref 0.0–0.2)
Basos: 0 %
EOS (ABSOLUTE): 0.2 10*3/uL (ref 0.0–0.4)
Eos: 2 %
Hematocrit: 35.4 % (ref 34.0–46.6)
Hemoglobin: 11.9 g/dL (ref 11.1–15.9)
Immature Grans (Abs): 0 10*3/uL (ref 0.0–0.1)
Immature Granulocytes: 0 %
Lymphocytes Absolute: 2.2 10*3/uL (ref 0.7–3.1)
Lymphs: 23 %
MCH: 28.7 pg (ref 26.6–33.0)
MCHC: 33.6 g/dL (ref 31.5–35.7)
MCV: 86 fL (ref 79–97)
Monocytes Absolute: 0.7 10*3/uL (ref 0.1–0.9)
Monocytes: 8 %
Neutrophils Absolute: 6.3 10*3/uL (ref 1.4–7.0)
Neutrophils: 67 %
Platelets: 353 10*3/uL (ref 150–450)
RBC: 4.14 x10E6/uL (ref 3.77–5.28)
RDW: 14.2 % (ref 11.7–15.4)
WBC: 9.5 10*3/uL (ref 3.4–10.8)

## 2023-02-18 LAB — HEMOGLOBIN A1C
Est. average glucose Bld gHb Est-mCnc: 111 mg/dL
Hgb A1c MFr Bld: 5.5 % (ref 4.8–5.6)

## 2023-02-18 MED ORDER — POTASSIUM CHLORIDE CRYS ER 20 MEQ PO TBCR: 40.0000 meq | EXTENDED_RELEASE_TABLET | Freq: Two times a day (BID) | ORAL | 0 refills | Status: DC

## 2023-02-18 NOTE — Progress Notes (Signed)
While acute kidney injury has resolved, labs still indicate low potassium and slight dehydration. Recommend potassium supplement for 2 weeks with plan for repeat labs at that time.   A1c is no longer in pre-diabetic range.  Cell count has stabilized.

## 2023-02-19 ENCOUNTER — Encounter: Payer: Self-pay | Admitting: Adult Health

## 2023-02-19 ENCOUNTER — Ambulatory Visit (INDEPENDENT_AMBULATORY_CARE_PROVIDER_SITE_OTHER): Payer: No Typology Code available for payment source | Admitting: Adult Health

## 2023-02-19 DIAGNOSIS — F17210 Nicotine dependence, cigarettes, uncomplicated: Secondary | ICD-10-CM

## 2023-02-19 NOTE — Progress Notes (Signed)
  Virtual Visit via Telephone Note  I connected with Stephanie Ball , 02/19/23 10:34 AM by a telemedicine application and verified that I am speaking with the correct person using two identifiers.  Location: Patient: home Provider: home   I discussed the limitations of evaluation and management by telemedicine and the availability of in person appointments. The patient expressed understanding and agreed to proceed.   Shared Decision Making Visit Lung Cancer Screening Program [phone])   Eligibility: 67 y.o. Pack Years Smoking History Calculation = 20pack year (# packs/per year x # years smoked) Recent History of coughing up blood  no Unexplained weight loss? no ( >Than 15 pounds within the last 6 months ) Prior History Lung / other cancer no (Diagnosis within the last 5 years already requiring surveillance chest CT Scans). Smoking Status Current Smoker  Visit Components: Discussion included one or more decision making aids. YES Discussion included risk/benefits of screening. YES Discussion included potential follow up diagnostic testing for abnormal scans. YES Discussion included meaning and risk of over diagnosis. YES Discussion included meaning and risk of False Positives. YES Discussion included meaning of total radiation exposure. YES  Counseling Included: Importance of adherence to annual lung cancer LDCT screening. YES Impact of comorbidities on ability to participate in the program. YES Ability and willingness to under diagnostic treatment. YES  Smoking Cessation Counseling: Current Smokers:  Discussed importance of smoking cessation. yes Information about tobacco cessation classes and interventions provided to patient. yes Patient provided with "ticket" for LDCT Scan. yes Symptomatic Patient. no Diagnosis Code: Tobacco Use Z72.0 Asymptomatic Patient yes  Counseling (Intermediate counseling: > three minutes counseling) U0454 Patient provided with "ticket" for  LDCT Scan. yes Written Order for Lung Cancer Screening with LDCT placed in Epic. Yes (CT Chest Lung Cancer Screening Low Dose W/O CM) [id number]  Z12.2-Screening of respiratory organs Z87.891-Personal history of nicotine dependence   Danford Bad 02/19/23

## 2023-02-19 NOTE — Patient Instructions (Signed)

## 2023-02-24 ENCOUNTER — Ambulatory Visit
Admission: RE | Admit: 2023-02-24 | Discharge: 2023-02-24 | Disposition: A | Discharge status: Home / Self Care | Payer: No Typology Code available for payment source | Source: Ambulatory Visit | Attending: Family Medicine | Admitting: Family Medicine

## 2023-02-24 DIAGNOSIS — Z87891 Personal history of nicotine dependence: Secondary | ICD-10-CM

## 2023-02-24 DIAGNOSIS — Z122 Encounter for screening for malignant neoplasm of respiratory organs: Secondary | ICD-10-CM

## 2023-02-24 DIAGNOSIS — F17210 Nicotine dependence, cigarettes, uncomplicated: Secondary | ICD-10-CM

## 2023-02-24 NOTE — Progress Notes (Signed)
Celso Amy, PA-C [address]  [address]  [place], [place] [address]  Main: [phone]  Fax: [phone]   Gastroenterology Consultation  Referring Provider:     Jacky Kindle, FNP Primary Care Physician:  Jacky Kindle, FNP Primary Gastroenterologist:  Celso Amy, PA-C / Dr. Lannette Donath   Reason for Consultation:     Abdominal pain, lower GI bleed        HPI:   Stephanie Ball is a 67 y.o. y/o female referred for consultation & management  by Jacky Kindle, FNP.  Here today with her husband.  She went to [facility] ED 02/01/2023 to evaluate acute 2-day history of generalized abdominal pain, rectal bleeding, nausea, vomiting, and diarrhea.  Abdominal pelvic CT showed infectious versus inflammatory colitis.  There is moderate wall thickening and inflammation in the descending and sigmoid colon.  White count elevated at 24.  Normal hemoglobin 14.  Repeat labs 02/17/2023 showed normal white count 9.5, hemoglobin 11.9, BUN 14, creatinine 1.54, GFR 37, low potassium 3.0.  She was started on potassium supplement for 2 weeks with plan for repeat BMP through her PCP.  History of constipation and takes OTC laxatives as needed.  Also history of hyperlipidemia, CKD, and bipolar disorder.  Previous cholecystectomy and hysterectomy.  She has never had a colonoscopy.  No previous GI evaluation.  No family history of colon cancer.  Patient has a strong gag reflex.  She tried drinking GoLytely for colon purge, however could not tolerate due to nausea vomiting.  She is willing to do a pill prep for a colonoscopy.  After her ED visit in September, her GI symptoms resolved.  She is not having any more abdominal pain, diarrhea, or rectal bleeding.  Admits to chronic constipation for many years.  Has bowel movement once per week with hard stools.  Past Medical History:  Diagnosis Date   Allergy    Bipolar disorder (HCC) 08/24/2008   Cataract    Chronic kidney disease    Depressive  disorder 08/24/2008   Disorder of kidney and ureter 07/19/2009   Fibrocystic breast disease    GERD (gastroesophageal reflux disease)    Hematuria 08/24/2008   Hydronephrosis of left kidney    Hypercalcemia 07/19/2009   Hyperglycemia    Hyperlipidemia 08/24/2008   Hypertension 08/24/2008   essential, benign   Hypokalemia    Panic attack    Pure hypercholesterolemia 01/29/2009   Seasonal allergic rhinitis     Past Surgical History:  Procedure Laterality Date   ABDOMINAL HYSTERECTOMY     abdominal:due to fibroid and endometriosis. No history of abnormal paps. Ovaries removed also.   ARTHRODESIS METATARSALPHALANGEAL JOINT (MTPJ) Left 11/19/2022   Procedure: ARTHRODESIS METATARSALPHALANGEAL JOINT (MTPJ);  Surgeon: Rosetta Posner, DPM;  Location: [facility];  Service: Orthopedics/Podiatry;  Laterality: Left;   BREAST BIOPSY Right 2012   FIBROEPITHEIAL LESION WITH FLORID DUCTAL   CESAREAN SECTION     CHOLECYSTECTOMY     no further information given   EYE SURGERY     KIDNEY SURGERY     as stated pt had kidney surgery with no further given   OOPHORECTOMY Bilateral 1996   Dr, DeFrancisco   TUBAL LIGATION      Prior to Admission medications   Medication Sig Start Date End Date Taking? Authorizing Provider  amLODipine (NORVASC) 5 MG tablet TAKE 1 TABLET (5 MG TOTAL) BY MOUTH DAILY. 09/08/22   Jacky Kindle, FNP  busPIRone (BUSPAR) 30 MG tablet TAKE  1 TABLET BY MOUTH TWICE A DAY 01/27/23   Merita Norton T, FNP  clotrimazole-betamethasone (LOTRISONE) cream APPLY TOPICALLY 2 TIMES DAILY 07/05/18   Margaretann Loveless, PA-C  colchicine 0.6 MG tablet Take 2 tabs PO at onset of symptoms, and one tab PO daily until symptoms resolve. Repeat as needed 05/17/20   Margaretann Loveless, PA-C  doxycycline (ADOXA) 100 MG tablet Take 1 tablet (100 mg total) by mouth 2 (two) times daily. 11/19/22   Rosetta Posner, DPM  famotidine (PEPCID) 20 MG tablet Take 20 mg by mouth 2 (two) times daily.     [provider]  hydrOXYzine (ATARAX) 25 MG tablet TAKE 1 TABLET BY MOUTH THREE TIMES A DAY AS NEEDED 06/24/22   Merita Norton T, FNP  lisinopril (ZESTRIL) 40 MG tablet TAKE 1 TABLET BY MOUTH EVERY DAY 01/27/23   Jacky Kindle, FNP  lovastatin (MEVACOR) 40 MG tablet Take 40 mg by mouth at bedtime.    [provider]  montelukast (SINGULAIR) 10 MG tablet TAKE 1 TABLET BY MOUTH EVERY DAY 09/14/22   Merita Norton T, FNP  OLANZapine (ZYPREXA) 15 MG tablet TAKE 1 TABLET (15 MG TOTAL) BY MOUTH DAILY. 11/06/22   Jacky Kindle, FNP  potassium chloride SA (KLOR-CON M) 20 MEQ tablet Take 2 tablets (40 mEq total) by mouth 2 (two) times daily. 02/18/23   Jacky Kindle, FNP  propranolol (INDERAL) 20 MG tablet Take 1 tablet (20 mg total) by mouth daily as needed (anxiety). 11/10/22   Jacky Kindle, FNP  rosuvastatin (CRESTOR) 20 MG tablet TAKE 1 TABLET BY MOUTH EVERY DAY 01/27/23   Jacky Kindle, FNP  traZODone (DESYREL) 100 MG tablet Take 2 tablets (200 mg total) by mouth at bedtime. 02/17/23   Jacky Kindle, FNP  venlafaxine (EFFEXOR) 75 MG tablet TAKE 1 TABLET (75 MG TOTAL) BY MOUTH 3 (THREE) TIMES DAILY WITH MEALS. 01/27/23   Jacky Kindle, FNP  zolpidem (AMBIEN) 5 MG tablet Take 1 tablet (5 mg total) by mouth at bedtime as needed for sleep. 02/12/23   Jacky Kindle, FNP    Family History  Problem Relation Age of Onset   Anuerysm Mother    Diabetes Father    Alcohol abuse Father    Cancer Paternal Grandmother        skin   Healthy Sister    Healthy Daughter    Healthy Sister    Breast cancer Neg Hx      Social History   Tobacco Use   Smoking status: Some Days    Current packs/day: 0.00    Average packs/day: 0.5 packs/day for 40.0 years (20.0 ttl pk-yrs)    Types: Cigarettes    Start date: 04/09/1983    Last attempt to quit: 08/28/2022    Years since quitting: 0.4   Smokeless tobacco: Never   Tobacco comments:    Quit cold [place] within the last week  Vaping Use   Vaping  status: Never Used  Substance Use Topics   Alcohol use: No   Drug use: No    Allergies as of 02/26/2023 - Review Complete 02/26/2023  Allergen Reaction Noted   Aciphex  [rabeprazole sodium]  12/06/2014   Amoxicillin-pot clavulanate  12/06/2014   Iron  12/06/2014   Macrobid  [nitrofurantoin monohyd macro]  12/06/2014   Nitrofurantoin monohyd macro  12/06/2014    Review of Systems:    All systems reviewed and negative except where noted in HPI.   Physical  Exam:  BP 128/77   Pulse 94   Temp 98.1 F (36.7 C) (Oral)   Ht 5\' 2"  (1.575 m)   Wt 157 lb 9.6 oz (71.5 kg)   BMI 28.83 kg/m  No LMP recorded. Patient has had a hysterectomy.  General:   Alert,  Well-developed, well-nourished, pleasant and cooperative in NAD Lungs:  Respirations even and unlabored.  Clear throughout to auscultation.   No wheezes, crackles, or rhonchi. No acute distress. Heart:  Regular rate and rhythm; no murmurs, clicks, rubs, or gallops. Abdomen:  Normal bowel sounds.  No bruits.  Soft, and non-distended without masses, hepatosplenomegaly or hernias noted.  No Tenderness.  No guarding or rebound tenderness.    Neurologic:  Alert and oriented x3;  grossly normal neurologically. Psych:  Alert and cooperative. Normal mood and affect.  Imaging Studies: CT ABDOMEN PELVIS WO CONTRAST  Result Date: 02/01/2023 CLINICAL DATA:  Acute generalized abdominal pain, bloody stool. EXAM: CT ABDOMEN AND PELVIS WITHOUT CONTRAST TECHNIQUE: Multidetector CT imaging of the abdomen and pelvis was performed following the standard protocol without IV contrast. RADIATION DOSE REDUCTION: This exam was performed according to the departmental dose-optimization program which includes automated exposure control, adjustment of the mA and/or kV according to patient size and/or use of iterative reconstruction technique. COMPARISON:  February 16, 2019. FINDINGS: Lower chest: No acute abnormality. Hepatobiliary: Status post cholecystectomy. No  biliary dilatation. Stable left renal cyst. Pancreas: Unremarkable. No pancreatic ductal dilatation or surrounding inflammatory changes. Spleen: Normal in size without focal abnormality. Adrenals/Urinary Tract: Adrenal glands appear normal. Severe left renal atrophy is noted. Nonobstructive bilateral nephrolithiasis is noted. Extensive right renal cortical scarring is noted. Multiple rounded exophytic high density abnormalities are seen involving right kidney which may represent exophytic renal cysts. No hydronephrosis or renal obstruction is noted. Urinary bladder is unremarkable. Stomach/Bowel: Stomach appears normal. Appendix is unremarkable. Moderate wall thickening with surrounding inflammatory changes is seen involving descending and sigmoid colon concerning for infectious or inflammatory colitis. Vascular/Lymphatic: Aortic atherosclerosis. No enlarged abdominal or pelvic lymph nodes. Reproductive: Status post hysterectomy. No adnexal masses. Other: No abdominal wall hernia or abnormality. No abdominopelvic ascites. Musculoskeletal: No acute or significant osseous findings. IMPRESSION: Moderate wall thickening with surrounding inflammatory changes is seen involving descending and sigmoid colon concerning for infectious or inflammatory colitis. Bilateral nonobstructive nephrolithiasis. Extensive right renal cortical scarring is noted. Multiple rounded partially exophytic high density abnormalities are seen involving the right kidney which may represent hyperdense cyst, but renal ultrasound is recommended for confirmation and to rule out neoplasm. Severe left renal atrophy is noted. Aortic Atherosclerosis (ICD10-I70.0). Electronically Signed   By: Lupita Raider M.D.   On: 02/01/2023 15:26    Assessment and Plan:   Shanethia Berberick is a 67 y.o. y/o female has been referred for hospital follow-up of acute infectious colitis seen on CT scan.  Symptoms have currently resolved.  She has lifelong chronic  idiopathic constipation.  She has never had a colonoscopy.  She has very strong gag reflex and cannot tolerate liquid preps.  She is willing to do a pill prep.  She vomited GoLytely in the past when it was used as a colon purge.  1.  Acute colitis (02/01/2023) -currently resolved and asymptomatic; suspect she had acute infectious colitis which resolved. 2.  Abnormal CT descending and sigmoid colon (02/01/2023) 3.  Rectal bleeding -currently resolved. 4. Chronic Idiopathic Constipaiton 5.  Colon cancer screening -she has never had a colonoscopy  Plan: Scheduling Colonoscopy I discussed risks  of colonoscopy with patient to include risk of bleeding, colon perforation, and risk of sedation.  Patient expressed understanding and agrees to proceed with colonoscopy.   SuTab Prep given per patient request.; 2 Day Liquid Prep   2. Start Linzess 1 capsule daily for chronic constipation, Samples and Rx given.     Add Miralax 1-2 capfuls in a drink daily if needed.    I stressed the importance of drinking 64 ounces of fluids every day.    High-fiber diet with fruits, vegetables, whole grains.   Follow up in 3 months with TG for chronic constipation.  Celso Amy, PA-C

## 2023-02-25 ENCOUNTER — Encounter: Payer: Self-pay | Admitting: Family Medicine

## 2023-02-26 ENCOUNTER — Other Ambulatory Visit: Payer: Self-pay

## 2023-02-26 ENCOUNTER — Ambulatory Visit: Payer: No Typology Code available for payment source | Admitting: Physician Assistant

## 2023-02-26 ENCOUNTER — Encounter: Payer: Self-pay | Admitting: Physician Assistant

## 2023-02-26 VITALS — BP 128/77 | HR 94 | Temp 98.1°F | Ht 62.0 in | Wt 157.6 lb

## 2023-02-26 DIAGNOSIS — Z8719 Personal history of other diseases of the digestive system: Secondary | ICD-10-CM

## 2023-02-26 DIAGNOSIS — R933 Abnormal findings on diagnostic imaging of other parts of digestive tract: Secondary | ICD-10-CM | POA: Diagnosis not present

## 2023-02-26 DIAGNOSIS — K5904 Chronic idiopathic constipation: Secondary | ICD-10-CM | POA: Diagnosis not present

## 2023-02-26 DIAGNOSIS — K625 Hemorrhage of anus and rectum: Secondary | ICD-10-CM

## 2023-02-26 MED ORDER — SUTAB 1479-225-188 MG PO TABS: ORAL_TABLET | ORAL | 0 refills | Status: DC

## 2023-02-26 NOTE — Patient Instructions (Addendum)
Please start taking Linzess 290 MCG (1 capsule) daily 30 minutes before breakfast. Please drink plenty of fluids during the day.  If Linzes 290 MCG is not helping your bowels to move, please take Miralax 17 Grams daily in addition with the Linzess 290 MCG daily.

## 2023-03-04 ENCOUNTER — Other Ambulatory Visit: Payer: Self-pay | Admitting: Family Medicine

## 2023-03-04 DIAGNOSIS — R195 Other fecal abnormalities: Secondary | ICD-10-CM

## 2023-03-04 LAB — COLOGUARD: COLOGUARD: POSITIVE — AB

## 2023-03-04 NOTE — Progress Notes (Signed)
Positive cologuard- referral placed to GI

## 2023-03-11 ENCOUNTER — Encounter: Payer: Self-pay | Admitting: Family Medicine

## 2023-03-11 ENCOUNTER — Other Ambulatory Visit: Payer: Self-pay | Admitting: Family Medicine

## 2023-03-11 DIAGNOSIS — I10 Essential (primary) hypertension: Secondary | ICD-10-CM

## 2023-03-12 ENCOUNTER — Other Ambulatory Visit: Payer: Self-pay | Admitting: Family Medicine

## 2023-03-12 DIAGNOSIS — I10 Essential (primary) hypertension: Secondary | ICD-10-CM

## 2023-03-12 MED ORDER — CLOTRIMAZOLE-BETAMETHASONE 1-0.05 % EX CREA: TOPICAL_CREAM | Freq: Two times a day (BID) | CUTANEOUS | 1 refills | Status: DC

## 2023-03-12 MED ORDER — LISINOPRIL 40 MG PO TABS: 40.0000 mg | ORAL_TABLET | Freq: Every day | ORAL | 0 refills | Status: DC

## 2023-03-12 NOTE — Telephone Encounter (Signed)
Requested Prescriptions  Pending Prescriptions Disp Refills   amLODipine (NORVASC) 5 MG tablet [Pharmacy Med Name: AMLODIPINE BESYLATE 5 MG TAB] 90 tablet 1    Sig: TAKE 1 TABLET (5 MG TOTAL) BY MOUTH DAILY.     Cardiovascular: Calcium Channel Blockers 2 Passed - 03/12/2023  1:37 AM      Passed - Last BP in normal range    BP Readings from Last 1 Encounters:  02/26/23 128/77         Passed - Last Heart Rate in normal range    Pulse Readings from Last 1 Encounters:  02/26/23 94         Passed - Valid encounter within last 6 months    Recent Outpatient Visits           3 weeks ago Hospital discharge follow-up   [facility] Merita Norton T, FNP   4 months ago Panic attacks   [facility] Jacky Kindle, FNP   6 months ago Annual physical exam   [facility] Jacky Kindle, FNP   1 year ago Annual physical exam   [facility] Jacky Kindle, FNP   1 year ago Primary hypertension   [facility] Jacky Kindle, [place]

## 2023-03-15 NOTE — Telephone Encounter (Signed)
Requested Prescriptions  Pending Prescriptions Disp Refills   KLOR-CON M20 20 MEQ tablet [Pharmacy Med Name: KLOR-CON M20 TABLET] 360 tablet 1    Sig: TAKE 2 TABLETS BY MOUTH 2 TIMES DAILY.     Endocrinology:  Minerals - Potassium Supplementation Failed - 03/12/2023  1:31 PM      Failed - K in normal range and within 360 days    Potassium  Date Value Ref Range Status  02/17/2023 3.0 (L) 3.5 - 5.2 mmol/L Final         Failed - Cr in normal range and within 360 days    Creat  Date Value Ref Range Status  12/24/2020 1.57 (H) 0.50 - 1.05 mg/dL Final   Creatinine, Ser  Date Value Ref Range Status  02/17/2023 1.54 (H) 0.57 - 1.00 mg/dL Final         Passed - Valid encounter within last 12 months    Recent Outpatient Visits           3 weeks ago Hospital discharge follow-up   [facility] Merita Norton T, FNP   4 months ago Panic attacks   [facility] Jacky Kindle, FNP   6 months ago Annual physical exam   [facility] Jacky Kindle, FNP   1 year ago Annual physical exam   [facility] Jacky Kindle, FNP   1 year ago Primary hypertension   [facility] Jacky Kindle, [place]

## 2023-03-15 NOTE — Telephone Encounter (Signed)
Contacted the reading room and requested results ASAP. They will push them through.

## 2023-03-16 ENCOUNTER — Other Ambulatory Visit: Payer: Self-pay | Admitting: Acute Care

## 2023-03-16 DIAGNOSIS — F17210 Nicotine dependence, cigarettes, uncomplicated: Secondary | ICD-10-CM

## 2023-03-16 DIAGNOSIS — Z122 Encounter for screening for malignant neoplasm of respiratory organs: Secondary | ICD-10-CM

## 2023-03-16 DIAGNOSIS — Z87891 Personal history of nicotine dependence: Secondary | ICD-10-CM

## 2023-03-18 ENCOUNTER — Encounter: Payer: Self-pay | Admitting: Gastroenterology

## 2023-03-19 ENCOUNTER — Ambulatory Visit: Payer: No Typology Code available for payment source | Admitting: Physician Assistant

## 2023-03-25 ENCOUNTER — Ambulatory Visit
Admission: RE | Admit: 2023-03-25 | Payer: No Typology Code available for payment source | Source: Home / Self Care | Admitting: Gastroenterology

## 2023-03-25 SURGERY — COLONOSCOPY WITH PROPOFOL
Anesthesia: General

## 2023-04-08 ENCOUNTER — Other Ambulatory Visit: Payer: Self-pay | Admitting: Family Medicine

## 2023-04-08 DIAGNOSIS — F3170 Bipolar disorder, currently in remission, most recent episode unspecified: Secondary | ICD-10-CM

## 2023-04-14 ENCOUNTER — Ambulatory Visit (INDEPENDENT_AMBULATORY_CARE_PROVIDER_SITE_OTHER): Payer: No Typology Code available for payment source | Admitting: Family Medicine

## 2023-04-14 ENCOUNTER — Encounter: Payer: Self-pay | Admitting: Family Medicine

## 2023-04-14 VITALS — BP 128/90 | HR 98 | Ht 62.0 in | Wt 161.0 lb

## 2023-04-14 DIAGNOSIS — E213 Hyperparathyroidism, unspecified: Secondary | ICD-10-CM

## 2023-04-14 DIAGNOSIS — N1832 Chronic kidney disease, stage 3b: Secondary | ICD-10-CM

## 2023-04-14 DIAGNOSIS — I10 Essential (primary) hypertension: Secondary | ICD-10-CM

## 2023-04-14 DIAGNOSIS — F17200 Nicotine dependence, unspecified, uncomplicated: Secondary | ICD-10-CM

## 2023-04-14 DIAGNOSIS — I2510 Atherosclerotic heart disease of native coronary artery without angina pectoris: Secondary | ICD-10-CM | POA: Diagnosis not present

## 2023-04-14 DIAGNOSIS — F5101 Primary insomnia: Secondary | ICD-10-CM

## 2023-04-14 DIAGNOSIS — I700 Atherosclerosis of aorta: Secondary | ICD-10-CM | POA: Diagnosis not present

## 2023-04-14 DIAGNOSIS — J432 Centrilobular emphysema: Secondary | ICD-10-CM | POA: Diagnosis not present

## 2023-04-14 DIAGNOSIS — F319 Bipolar disorder, unspecified: Secondary | ICD-10-CM

## 2023-04-14 MED ORDER — ZOLPIDEM TARTRATE 10 MG PO TABS: 10.0000 mg | ORAL_TABLET | Freq: Every evening | ORAL | 1 refills | Status: DC | PRN

## 2023-04-14 MED ORDER — TRELEGY ELLIPTA 100-62.5-25 MCG/ACT IN AEPB: 1.0000 | INHALATION_SPRAY | Freq: Every day | RESPIRATORY_TRACT | 11 refills | Status: DC

## 2023-04-14 NOTE — Assessment & Plan Note (Signed)
Chronic, uncontrolled Wishes to increase ambien to assist sleep and overall Bipolar I disorder Follow up as needed

## 2023-04-14 NOTE — Assessment & Plan Note (Signed)
Chronic, per CT scan Advised of low fat diet, regular exercise, smoking cessation efforts to assist high dose statin previously prescribed

## 2023-04-14 NOTE — Assessment & Plan Note (Signed)
Chronic, encouraged to reduce use given known COPD per LDLCT

## 2023-04-14 NOTE — Assessment & Plan Note (Signed)
Chronic, stable Request for interval dosing increase in ambien to 10 mg as 5 mg is ineffective; Pt is aware of risks of psychoactive medication use to include increased sedation, respiratory suppression, falls, extrapyramidal movements,  dependence and cardiovascular events.  Pt would like to continue treatment as benefit determined to outweigh risk.

## 2023-04-14 NOTE — Assessment & Plan Note (Signed)
Chronic, with borderline elevation in DBP Goal remains 119/79 Consider increase of norvasc from 5 mg to 10 mg to further assist Remains on lisinopril 40 mg

## 2023-04-14 NOTE — Assessment & Plan Note (Signed)
Chronic, stable Trial of sample of trelegy 100 to assist, once daily Encouraged further tobacco cessation efforts

## 2023-04-14 NOTE — Progress Notes (Signed)
Established patient visit  Patient: Stephanie Ball   DOB: 1955/06/30   67 y.o. Female  MRN: [id number] Visit Date: 04/14/2023  Today's healthcare provider: Jacky Kindle, FNP  Re Introduced to nurse practitioner role and practice setting.  All questions answered.  Discussed provider/patient relationship and expectations.  Subjective    HPI   Pt presents with SO with questions following her LDLCT scan. Also reports endocrine referral is needed given climbing PTH levels per recommendation of nephrology.  Medications: Outpatient Medications Prior to Visit  Medication Sig   amLODipine (NORVASC) 5 MG tablet TAKE 1 TABLET (5 MG TOTAL) BY MOUTH DAILY.   busPIRone (BUSPAR) 30 MG tablet TAKE 1 TABLET BY MOUTH TWICE A DAY   clotrimazole-betamethasone (LOTRISONE) cream Apply topically 2 (two) times daily.   colchicine 0.6 MG tablet Take 2 tabs PO at onset of symptoms, and one tab PO daily until symptoms resolve. Repeat as needed   famotidine (PEPCID) 20 MG tablet Take 20 mg by mouth 2 (two) times daily.   hydrOXYzine (ATARAX) 25 MG tablet TAKE 1 TABLET BY MOUTH THREE TIMES A DAY AS NEEDED   lisinopril (ZESTRIL) 40 MG tablet Take 1 tablet (40 mg total) by mouth daily.   lovastatin (MEVACOR) 40 MG tablet Take 40 mg by mouth at bedtime.   montelukast (SINGULAIR) 10 MG tablet TAKE 1 TABLET BY MOUTH EVERY DAY   OLANZapine (ZYPREXA) 15 MG tablet TAKE 1 TABLET (15 MG TOTAL) BY MOUTH DAILY.   propranolol (INDERAL) 20 MG tablet Take 1 tablet (20 mg total) by mouth daily as needed (anxiety).   rosuvastatin (CRESTOR) 20 MG tablet TAKE 1 TABLET BY MOUTH EVERY DAY   traZODone (DESYREL) 100 MG tablet Take 2 tablets (200 mg total) by mouth at bedtime.   venlafaxine (EFFEXOR) 75 MG tablet TAKE 1 TABLET (75 MG TOTAL) BY MOUTH 3 (THREE) TIMES DAILY WITH MEALS.   [DISCONTINUED] zolpidem (AMBIEN) 5 MG tablet Take 1 tablet (5 mg total) by mouth at bedtime as needed for sleep.   [DISCONTINUED] doxycycline  (ADOXA) 100 MG tablet Take 1 tablet (100 mg total) by mouth 2 (two) times daily.   [DISCONTINUED] KLOR-CON M20 20 MEQ tablet TAKE 2 TABLETS BY MOUTH 2 TIMES DAILY.   [DISCONTINUED] Sodium Sulfate-Mag Sulfate-KCl (SUTAB) [phone] MG TABS At 5 PM take 12 tablets using the 8 oz cup provided in the kit drinking 5 cups of water and 5 hours before your procedure repeat the same process.   No facility-administered medications prior to visit.   Last CBC Lab Results  Component Value Date   WBC 9.5 02/17/2023   HGB 11.9 02/17/2023   HCT 35.4 02/17/2023   MCV 86 02/17/2023   MCH 28.7 02/17/2023   RDW 14.2 02/17/2023   PLT 353 02/17/2023   Last metabolic panel Lab Results  Component Value Date   GLUCOSE 116 (H) 02/17/2023   NA 145 (H) 02/17/2023   K 3.0 (L) 02/17/2023   CL 108 (H) 02/17/2023   CO2 22 02/17/2023   BUN 14 02/17/2023   CREATININE 1.54 (H) 02/17/2023   EGFR 37 (L) 02/17/2023   CALCIUM 10.3 02/17/2023   PROT 6.4 02/17/2023   ALBUMIN 3.9 02/17/2023   LABGLOB 2.5 02/17/2023   AGRATIO 1.8 11/09/2018   BILITOT 0.5 02/17/2023   ALKPHOS 105 02/17/2023   AST 13 02/17/2023   ALT 7 02/17/2023   ANIONGAP 11 02/01/2023   Last lipids Lab Results  Component Value Date   CHOL 153 09/15/2022  HDL 74 (A) 09/15/2022   LDLCALC 64 09/15/2022   TRIG 69 09/15/2022   CHOLHDL 4.6 12/24/2020   Last hemoglobin A1c Lab Results  Component Value Date   HGBA1C 5.5 02/17/2023   Last thyroid functions Lab Results  Component Value Date   TSH 1.230 11/16/2022   Last vitamin D Lab Results  Component Value Date   VD25OH 129 (H) 12/24/2020   Last vitamin B12 and Folate Lab Results  Component Value Date   VITAMINB12 148 (L) 12/24/2020     Objective    BP (!) 128/90 (BP Location: Right Arm, Patient Position: Sitting, Cuff Size: Normal)   Pulse 98   Ht 5\' 2"  (1.575 m)   Wt 161 lb (73 kg)   SpO2 97%   BMI 29.45 kg/m   BP Readings from Last 3 Encounters:  04/14/23 (!)  128/90  02/26/23 128/77  02/17/23 131/81   Wt Readings from Last 3 Encounters:  04/14/23 161 lb (73 kg)  02/26/23 157 lb 9.6 oz (71.5 kg)  02/17/23 160 lb 1.6 oz (72.6 kg)   SpO2 Readings from Last 3 Encounters:  04/14/23 97%  02/17/23 100%  02/01/23 98%   Physical Exam Vitals and nursing note reviewed.  Constitutional:      General: She is not in acute distress.    Appearance: Normal appearance. She is overweight. She is not ill-appearing, toxic-appearing or diaphoretic.  HENT:     Head: Normocephalic and atraumatic.  Cardiovascular:     Rate and Rhythm: Normal rate and regular rhythm.     Pulses: Normal pulses.     Heart sounds: Normal heart sounds. No murmur heard.    No friction rub. No gallop.  Pulmonary:     Effort: Pulmonary effort is normal. No respiratory distress.     Breath sounds: Normal breath sounds. No stridor. No wheezing, rhonchi or rales.  Chest:     Chest wall: No tenderness.  Musculoskeletal:        General: No swelling, tenderness, deformity or signs of injury. Normal range of motion.     Right lower leg: No edema.     Left lower leg: No edema.  Skin:    General: Skin is warm and dry.     Capillary Refill: Capillary refill takes less than 2 seconds.     Coloration: Skin is not jaundiced or pale.     Findings: No bruising, erythema, lesion or rash.  Neurological:     General: No focal deficit present.     Mental Status: She is alert and oriented to person, place, and time. Mental status is at baseline.     Cranial Nerves: No cranial nerve deficit.     Sensory: No sensory deficit.     Motor: No weakness.     Coordination: Coordination normal.  Psychiatric:        Mood and Affect: Mood normal.        Behavior: Behavior normal.        Thought Content: Thought content normal.        Judgment: Judgment normal.     No results found for any visits on 04/14/23.  Assessment & Plan     Problem List Items Addressed This Visit       Cardiovascular  and Mediastinum   Aortic atherosclerosis (HCC)    Chronic, per CT scan Advised of low fat diet, regular exercise, smoking cessation efforts to assist high dose statin previously prescribed       Coronary artery  disease involving native coronary artery of native heart without angina pectoris    Chronic, per CT scan, no symptoms  Continue to monitor Declines referral to cards or Rx for nitroglycerin at this time  Advised if she was to experience CP to seek emergent care by calling 9-1-1 Consider use of 81 mg ASA to further assist if desired      Primary hypertension    Chronic, with borderline elevation in DBP Goal remains 119/79 Consider increase of norvasc from 5 mg to 10 mg to further assist Remains on lisinopril 40 mg        Respiratory   Centrilobular emphysema (HCC)    Chronic, stable Trial of sample of trelegy 100 to assist, once daily Encouraged further tobacco cessation efforts       Relevant Medications   Fluticasone-Umeclidin-Vilant (TRELEGY ELLIPTA) 100-62.5-25 MCG/ACT AEPB     Endocrine   Hyperparathyroidism (HCC) - Primary    Chronic, worsening Referral to endo to further assist       Relevant Orders   Ambulatory referral to Endocrinology     Genitourinary   Stage 3b chronic kidney disease (HCC)    Chronic, improved Followed by nephro Continue to monitor         Other   Bipolar 1 disorder (HCC)    Chronic, stable Request for interval dosing increase in ambien to 10 mg as 5 mg is ineffective; Pt is aware of risks of psychoactive medication use to include increased sedation, respiratory suppression, falls, extrapyramidal movements,  dependence and cardiovascular events.  Pt would like to continue treatment as benefit determined to outweigh risk.         Insomnia    Chronic, uncontrolled Wishes to increase ambien to assist sleep and overall Bipolar I disorder Follow up as needed      Tobacco dependence    Chronic, encouraged to reduce use given  known COPD per LDLCT      Return in about 5 months (around 09/03/2023) for annual examination.     Leilani Merl, FNP, have reviewed all documentation for this visit. The documentation on 04/14/23 for the exam, diagnosis, procedures, and orders are all accurate and complete.  Jacky Kindle, FNP  [facility] [phone] (phone) [phone] (fax)  [facility]

## 2023-04-14 NOTE — Assessment & Plan Note (Signed)
Chronic, improved Followed by nephro Continue to monitor

## 2023-04-14 NOTE — Assessment & Plan Note (Signed)
Chronic, worsening Referral to endo to further assist

## 2023-04-14 NOTE — Assessment & Plan Note (Signed)
Chronic, per CT scan, no symptoms  Continue to monitor Declines referral to cards or Rx for nitroglycerin at this time  Advised if she was to experience CP to seek emergent care by calling 9-1-1 Consider use of 81 mg ASA to further assist if desired

## 2023-04-29 ENCOUNTER — Ambulatory Visit: Payer: Self-pay | Admitting: *Deleted

## 2023-04-29 NOTE — Telephone Encounter (Signed)
  Chief Complaint: cough, chest heaviness, sinus issues moved to chest Symptoms: see above, productive cough yellow mucus.  Frequency: 3 days  Pertinent Negatives: Patient denies chest pain no difficulty breathing at rest. No fever Disposition: [] ED /[] Urgent Care (no appt availability in office) / [x] Appointment(In office/virtual)/ []  [facility]/ [] Home Care/ [] Refused Recommended Disposition /[] [facility]/ []  Follow-up with PCP Additional Notes:   No appt with PCP. Appt scheduled [facility]  04/30/23. Same day access      Reason for Disposition  SEVERE coughing spells (e.g., whooping sound after coughing, vomiting after coughing)  Answer Assessment - Initial Assessment Questions 1. ONSET: "When did the cough begin?"      Approx 3 days per husband who is not with patient now  2. SEVERITY: "How bad is the cough today?"      Getting worse 3. SPUTUM: "Describe the color of your sputum" (none, dry cough; clear, white, yellow, green)     Yellow  4. HEMOPTYSIS: "Are you coughing up any blood?" If so ask: "How much?" (flecks, streaks, tablespoons, etc.)     na 5. DIFFICULTY BREATHING: "Are you having difficulty breathing?" If Yes, ask: "How bad is it?" (e.g., mild, moderate, severe)    - MILD: No SOB at rest, mild SOB with walking, speaks normally in sentences, can lie down, no retractions, pulse < 100.    - MODERATE: SOB at rest, SOB with minimal exertion and prefers to sit, cannot lie down flat, speaks in phrases, mild retractions, audible wheezing, pulse 100-120.    - SEVERE: Very SOB at rest, speaks in single words, struggling to breathe, sitting hunched forward, retractions, pulse > 120      Chest heaviness no SOB at rest 6. FEVER: "Do you have a fever?" If Yes, ask: "What is your temperature, how was it measured, and when did it start?"     na 7. CARDIAC HISTORY: "Do you have any history of heart disease?" (e.g., heart attack, congestive heart failure)       na 8. LUNG HISTORY: "Do you have any history of lung disease?"  (e.g., pulmonary embolus, asthma, emphysema)     See hx  9. PE RISK FACTORS: "Do you have a history of blood clots?" (or: recent major surgery, recent prolonged travel, bedridden)     na 10. OTHER SYMPTOMS: "Do you have any other symptoms?" (e.g., runny nose, wheezing, chest pain)       Chest heaviness cough , yellow mucus  11. PREGNANCY: "Is there any chance you are pregnant?" "When was your last menstrual period?"       na 12. TRAVEL: "Have you traveled out of the country in the last month?" (e.g., travel history, exposures)       na  Protocols used: Cough - Acute Productive-A-AH

## 2023-04-30 ENCOUNTER — Ambulatory Visit (INDEPENDENT_AMBULATORY_CARE_PROVIDER_SITE_OTHER): Payer: No Typology Code available for payment source | Admitting: Internal Medicine

## 2023-04-30 ENCOUNTER — Encounter: Payer: Self-pay | Admitting: Internal Medicine

## 2023-04-30 VITALS — BP 126/76 | HR 90 | Resp 16 | Ht 62.0 in | Wt 161.0 lb

## 2023-04-30 DIAGNOSIS — J441 Chronic obstructive pulmonary disease with (acute) exacerbation: Secondary | ICD-10-CM | POA: Diagnosis not present

## 2023-04-30 DIAGNOSIS — R051 Acute cough: Secondary | ICD-10-CM | POA: Diagnosis not present

## 2023-04-30 MED ORDER — PREDNISONE 20 MG PO TABS
40.0000 mg | ORAL_TABLET | Freq: Every day | ORAL | 0 refills | Status: AC
Start: 2023-04-30 — End: 2023-05-05

## 2023-04-30 MED ORDER — BENZONATATE 100 MG PO CAPS: 100.0000 mg | ORAL_CAPSULE | Freq: Two times a day (BID) | ORAL | 0 refills | Status: DC | PRN

## 2023-04-30 MED ORDER — DOXYCYCLINE HYCLATE 100 MG PO TABS
100.0000 mg | ORAL_TABLET | Freq: Two times a day (BID) | ORAL | 0 refills | Status: AC
Start: 2023-04-30 — End: 2023-05-07

## 2023-04-30 MED ORDER — ALBUTEROL SULFATE HFA 108 (90 BASE) MCG/ACT IN AERS: 2.0000 | INHALATION_SPRAY | Freq: Four times a day (QID) | RESPIRATORY_TRACT | 2 refills | Status: AC | PRN

## 2023-04-30 NOTE — Progress Notes (Signed)
Acute Office Visit  Subjective:     Patient ID: Stephanie Ball, female    DOB: October 30, 1955, 67 y.o.   MRN: [id number]  Chief Complaint  Patient presents with   Cough    Productive, x3 days. OTC Mucinex helped    Cough Associated symptoms include shortness of breath. Pertinent negatives include no chest pain, chills, ear pain, fever, sore throat or wheezing.   Patient is in today for cough. Patient is a [employer] [facility] patient. Symptoms started 1 week ago. Patient does have COPD, was given prescription for Trelegy earlier in the month and used it a few times prior to getting sick.  URI Compliant:  -Fever: no, 99 degrees once -Cough: yes, productive, thick yellow/white -Shortness of breath: yes with exertion  -Wheezing: no -Chest tightness: yes -Nasal congestion: yes -Runny nose: yes, thick and white -Post nasal drip: yes -Sore throat: yes -Sinus pressure: yes -Face pain: no -Ear pain: no  -Ear pressure: yes bilateral -Sick contacts: no  -Context: worse -Treatments attempted: cold/sinus, mucinex, and anti-histamine    Review of Systems  Constitutional:  Negative for chills and fever.  HENT:  Positive for congestion. Negative for ear pain, sinus pain and sore throat.   Respiratory:  Positive for cough, sputum production and shortness of breath. Negative for wheezing.   Cardiovascular:  Negative for chest pain.        Objective:    BP 126/76   Pulse 90   Resp 16   Ht 5\' 2"  (1.575 m)   Wt 161 lb (73 kg)   SpO2 98%   BMI 29.45 kg/m  BP Readings from Last 3 Encounters:  04/30/23 126/76  04/14/23 (!) 128/90  02/26/23 128/77   Wt Readings from Last 3 Encounters:  04/30/23 161 lb (73 kg)  04/14/23 161 lb (73 kg)  02/26/23 157 lb 9.6 oz (71.5 kg)      Physical Exam Constitutional:      Appearance: Normal appearance.  HENT:     Head: Normocephalic and atraumatic.     Right Ear: Tympanic membrane, ear canal and external ear normal.     Left  Ear: Tympanic membrane, ear canal and external ear normal.     Nose: Congestion present.     Mouth/Throat:     Mouth: Mucous membranes are moist.     Pharynx: Posterior oropharyngeal erythema present.  Cardiovascular:     Rate and Rhythm: Normal rate and regular rhythm.  Pulmonary:     Effort: Pulmonary effort is normal.     Breath sounds: No wheezing, rhonchi or rales.     Comments: Decreased breath sounds throughout Skin:    General: Skin is warm and dry.  Neurological:     General: No focal deficit present.     Mental Status: She is alert. Mental status is at baseline.  Psychiatric:        Mood and Affect: Mood normal.        Behavior: Behavior normal.     No results found for any visits on 04/30/23.      Assessment & Plan:   1. COPD exacerbation (HCC) (Primary)/Acute cough: Will treat with steroids and Doxycycline. Will prescribe cough suppressant and recommend she start back on the Trelegy daily and I will prescribe a rescue inhaler to use every 4-6 hours as needed.   - doxycycline (VIBRA-TABS) 100 MG tablet; Take 1 tablet (100 mg total) by mouth 2 (two) times daily for 7 days.  Dispense: 14 tablet; Refill:  0 - predniSONE (DELTASONE) 20 MG tablet; Take 2 tablets (40 mg total) by mouth daily with breakfast for 5 days.  Dispense: 10 tablet; Refill: 0 - benzonatate (TESSALON) 100 MG capsule; Take 1 capsule (100 mg total) by mouth 2 (two) times daily as needed for cough.  Dispense: 20 capsule; Refill: 0 - albuterol (VENTOLIN HFA) 108 (90 Base) MCG/ACT inhaler; Inhale 2 puffs into the lungs every 6 (six) hours as needed for wheezing or shortness of breath.  Dispense: 8 g; Refill: 2   Return if symptoms worsen or fail to improve.  Margarita Mail, DO

## 2023-05-04 ENCOUNTER — Encounter: Payer: Self-pay | Admitting: Family Medicine

## 2023-06-07 ENCOUNTER — Telehealth: Payer: Self-pay | Admitting: Physician Assistant

## 2023-06-07 ENCOUNTER — Encounter: Payer: Self-pay | Admitting: Physician Assistant

## 2023-06-07 ENCOUNTER — Ambulatory Visit: Payer: No Typology Code available for payment source | Admitting: Physician Assistant

## 2023-06-07 VITALS — BP 140/95 | HR 96 | Temp 98.5°F | Ht 62.0 in | Wt 158.0 lb

## 2023-06-07 DIAGNOSIS — J069 Acute upper respiratory infection, unspecified: Secondary | ICD-10-CM | POA: Diagnosis not present

## 2023-06-07 DIAGNOSIS — I10 Essential (primary) hypertension: Secondary | ICD-10-CM

## 2023-06-07 DIAGNOSIS — R051 Acute cough: Secondary | ICD-10-CM

## 2023-06-07 DIAGNOSIS — J029 Acute pharyngitis, unspecified: Secondary | ICD-10-CM | POA: Diagnosis not present

## 2023-06-07 DIAGNOSIS — F410 Panic disorder [episodic paroxysmal anxiety] without agoraphobia: Secondary | ICD-10-CM

## 2023-06-07 LAB — POC SOFIA 2 FLU + SARS ANTIGEN FIA
Influenza A, POC: NEGATIVE
Influenza B, POC: NEGATIVE
SARS Coronavirus 2 Ag: NEGATIVE

## 2023-06-07 LAB — POCT RAPID STREP A (OFFICE): Rapid Strep A Screen: NEGATIVE

## 2023-06-07 NOTE — Telephone Encounter (Signed)
Received fax from [facility] asking for refills on Hydroxyzine 25 mg. #[phone]

## 2023-06-07 NOTE — Progress Notes (Signed)
Established patient visit  Patient: Stephanie Ball   DOB: [birth year]   68 y.o. Female  MRN: [id number] Visit Date: 06/07/2023  Today's healthcare provider: Debera Lat, PA-C   Chief Complaint  Patient presents with   Sore Throat    X3 days   Cough    X3 days, productive, temp 100.9 today   Otalgia    Right, denies drainage   Subjective       Discussed the use of AI scribe software for clinical note transcription with the patient, who gave verbal consent to proceed.  History of Present Illness   The patient, with a history of early-stage COPD and current smoking, presents with ear pain, cough, and shortness of breath. The symptoms started after exposure to a family member with flu and strep throat. The patient reports that the ear pain is more of an ache and is localized to one ear. The cough is described as wet, and the patient has a history of bronchitis. The patient also reports shortness of breath, which is a common symptom in her COPD condition. The patient's blood pressure was noted to be elevated during the visit, which is unusual as it is typically around 110. The patient is currently on Trelegy and has an emergency albuterol inhaler for COPD management.           04/14/2023    8:07 AM 11/10/2022    8:36 AM 09/02/2022   10:54 AM  Depression screen PHQ 2/9  Decreased Interest 0 0 0  Down, Depressed, Hopeless 0 0 0  PHQ - 2 Score 0 0 0  Altered sleeping 0 0 0  Tired, decreased energy 0 0 0  Change in appetite 0 0 0  Feeling bad or failure about yourself  0 0 0  Trouble concentrating 0 0 0  Moving slowly or fidgety/restless 0 0 0  Suicidal thoughts 0 0 0  PHQ-9 Score 0 0 0  Difficult doing work/chores Not difficult at all Not difficult at all Not difficult at all      11/10/2022    8:33 AM 07/07/2021    9:09 AM 02/25/2021   11:47 AM 10/13/2019    5:03 PM  GAD 7 : Generalized Anxiety Score  Nervous, Anxious, on Edge 0 1 0 0  Control/stop worrying 0 1 0 0  Worry  too much - different things 0 1 0 2  Trouble relaxing 0 1 0 3  Restless 1 1 0 0  Easily annoyed or irritable 1 1 0 1  Afraid - awful might happen 0 1 0 2  Total GAD 7 Score 2 7 0 8  Anxiety Difficulty Not difficult at all Somewhat difficult Not difficult at all Not difficult at all    Medications: Outpatient Medications Prior to Visit  Medication Sig   albuterol (VENTOLIN HFA) 108 (90 Base) MCG/ACT inhaler Inhale 2 puffs into the lungs every 6 (six) hours as needed for wheezing or shortness of breath.   amLODipine (NORVASC) 5 MG tablet TAKE 1 TABLET (5 MG TOTAL) BY MOUTH DAILY.   benzonatate (TESSALON) 100 MG capsule Take 1 capsule (100 mg total) by mouth 2 (two) times daily as needed for cough.   busPIRone (BUSPAR) 30 MG tablet TAKE 1 TABLET BY MOUTH TWICE A DAY   clotrimazole-betamethasone (LOTRISONE) cream Apply topically 2 (two) times daily.   colchicine 0.6 MG tablet Take 2 tabs PO at onset of symptoms, and one tab PO daily until symptoms resolve. Repeat as needed  famotidine (PEPCID) 20 MG tablet Take 20 mg by mouth 2 (two) times daily.   Fluticasone-Umeclidin-Vilant (TRELEGY ELLIPTA) 100-62.5-25 MCG/ACT AEPB Inhale 1 puff into the lungs daily.   hydrOXYzine (ATARAX) 25 MG tablet TAKE 1 TABLET BY MOUTH THREE TIMES A DAY AS NEEDED   lisinopril (ZESTRIL) 40 MG tablet Take 1 tablet (40 mg total) by mouth daily.   lovastatin (MEVACOR) 40 MG tablet Take 40 mg by mouth at bedtime.   montelukast (SINGULAIR) 10 MG tablet TAKE 1 TABLET BY MOUTH EVERY DAY   OLANZapine (ZYPREXA) 15 MG tablet TAKE 1 TABLET (15 MG TOTAL) BY MOUTH DAILY.   propranolol (INDERAL) 20 MG tablet Take 1 tablet (20 mg total) by mouth daily as needed (anxiety).   rosuvastatin (CRESTOR) 20 MG tablet TAKE 1 TABLET BY MOUTH EVERY DAY   traZODone (DESYREL) 100 MG tablet Take 2 tablets (200 mg total) by mouth at bedtime.   venlafaxine (EFFEXOR) 75 MG tablet TAKE 1 TABLET (75 MG TOTAL) BY MOUTH 3 (THREE) TIMES DAILY WITH  MEALS.   zolpidem (AMBIEN) 10 MG tablet Take 1 tablet (10 mg total) by mouth at bedtime as needed for sleep.   No facility-administered medications prior to visit.    Review of Systems All negative Except see HPI       Objective    BP (!) 140/95   Pulse 96   Temp 98.5 F (36.9 C) (Oral)   Ht 5\' 2"  (1.575 m)   Wt 158 lb (71.7 kg)   SpO2 97%   BMI 28.90 kg/m     Physical Exam Vitals reviewed.  Constitutional:      General: She is not in acute distress.    Appearance: Normal appearance. She is well-developed. She is not diaphoretic.  HENT:     Head: Normocephalic and atraumatic.  Eyes:     General: No scleral icterus.    Conjunctiva/sclera: Conjunctivae normal.  Neck:     Thyroid: No thyromegaly.  Cardiovascular:     Rate and Rhythm: Normal rate and regular rhythm.     Pulses: Normal pulses.     Heart sounds: Normal heart sounds. No murmur heard. Pulmonary:     Effort: Pulmonary effort is normal. No respiratory distress.     Breath sounds: Normal breath sounds. No wheezing, rhonchi or rales.  Musculoskeletal:     Cervical back: Neck supple.     Right lower leg: No edema.     Left lower leg: No edema.  Lymphadenopathy:     Cervical: No cervical adenopathy.  Skin:    General: Skin is warm and dry.     Findings: No rash.  Neurological:     Mental Status: She is alert and oriented to person, place, and time. Mental status is at baseline.  Psychiatric:        Mood and Affect: Mood normal.        Behavior: Behavior normal.      Results for orders placed or performed in visit on 06/07/23  POC SOFIA 2 FLU + SARS ANTIGEN FIA  Result Value Ref Range   Influenza A, POC Negative Negative   Influenza B, POC Negative Negative   SARS Coronavirus 2 Ag Negative Negative  POCT rapid strep A  Result Value Ref Range   Rapid Strep A Screen Negative Negative        Assessment and Plan    Upper Respiratory Infection Viral symptoms with cough and sore throat. No  signs of bacterial infection at this  time. Patient has a history of bronchitis and early stage COPD. -Continue with current inhalers (Trelegy and Albuterol as needed). -Use Albuterol every 4-6 hours if wheezing, cough, or shortness of breath worsens. -Gargle with warm salt water for sore throat. -Use Mucinex for phlegm as needed, but monitor blood pressure. -Switch from Claritin to Zyrtec for congestion. -Contact provider if symptoms worsen or persist for more than 10 days.  Hypertension Elevated blood pressure noted during visit. -Return for follow-up in two weeks to monitor blood pressure.      Orders Placed This Encounter  Procedures   POC SOFIA 2 FLU + SARS ANTIGEN FIA   POCT rapid strep A    No follow-ups on file.   The patient was advised to call back or seek an in-person evaluation if the symptoms worsen or if the condition fails to improve as anticipated.  I discussed the assessment and treatment plan with the patient. The patient was provided an opportunity to ask questions and all were answered. The patient agreed with the plan and demonstrated an understanding of the instructions.  I, Debera Lat, PA-C have reviewed all documentation for this visit. The documentation on 06/07/2023  for the exam, diagnosis, procedures, and orders are all accurate and complete.  Debera Lat, [facility], MMS [facility] [phone] (phone) [phone] (fax)  [facility]

## 2023-06-08 NOTE — Addendum Note (Signed)
Addended by: Marjie Skiff on: 06/08/2023 10:25 AM   Modules accepted: Orders

## 2023-06-09 ENCOUNTER — Telehealth: Payer: Self-pay | Admitting: Physician Assistant

## 2023-06-09 DIAGNOSIS — F419 Anxiety disorder, unspecified: Secondary | ICD-10-CM

## 2023-06-09 MED ORDER — BUSPIRONE HCL 30 MG PO TABS: 30.0000 mg | ORAL_TABLET | Freq: Two times a day (BID) | ORAL | 1 refills | Status: DC

## 2023-06-09 MED ORDER — HYDROXYZINE HCL 25 MG PO TABS: 25.0000 mg | ORAL_TABLET | Freq: Three times a day (TID) | ORAL | 0 refills | Status: DC | PRN

## 2023-06-09 NOTE — Addendum Note (Signed)
 Addended by: Mekaila Tarnow on: 06/09/2023 12:10 PM   Modules accepted: Orders

## 2023-06-09 NOTE — Telephone Encounter (Signed)
[facility] for the following medications:   busPIRone (BUSPAR) 30 MG tablet   Please advise

## 2023-06-10 NOTE — Telephone Encounter (Signed)
 Prescription sent to pharmacy yesterday via separate encounter.

## 2023-06-25 ENCOUNTER — Ambulatory Visit: Payer: No Typology Code available for payment source | Admitting: Physician Assistant

## 2023-07-06 ENCOUNTER — Other Ambulatory Visit: Payer: Self-pay

## 2023-07-06 ENCOUNTER — Telehealth: Payer: Self-pay | Admitting: Physician Assistant

## 2023-07-06 DIAGNOSIS — I10 Essential (primary) hypertension: Secondary | ICD-10-CM

## 2023-07-06 MED ORDER — LISINOPRIL 40 MG PO TABS: 40.0000 mg | ORAL_TABLET | Freq: Every day | ORAL | 0 refills | Status: DC

## 2023-07-06 NOTE — Telephone Encounter (Signed)
 [facility] lisinopril (ZESTRIL) 40 MG tablet   Please advise

## 2023-07-09 ENCOUNTER — Other Ambulatory Visit: Payer: Self-pay

## 2023-07-09 ENCOUNTER — Encounter: Payer: Self-pay | Admitting: Internal Medicine

## 2023-07-09 ENCOUNTER — Ambulatory Visit: Payer: Self-pay | Admitting: Internal Medicine

## 2023-07-09 VITALS — BP 122/78 | HR 90 | Temp 97.8°F | Resp 16 | Ht 60.0 in | Wt 173.0 lb

## 2023-07-09 DIAGNOSIS — R7303 Prediabetes: Secondary | ICD-10-CM

## 2023-07-09 DIAGNOSIS — N1832 Chronic kidney disease, stage 3b: Secondary | ICD-10-CM

## 2023-07-09 DIAGNOSIS — I2510 Atherosclerotic heart disease of native coronary artery without angina pectoris: Secondary | ICD-10-CM | POA: Diagnosis not present

## 2023-07-09 DIAGNOSIS — F3170 Bipolar disorder, currently in remission, most recent episode unspecified: Secondary | ICD-10-CM

## 2023-07-09 DIAGNOSIS — E7800 Pure hypercholesterolemia, unspecified: Secondary | ICD-10-CM | POA: Diagnosis not present

## 2023-07-09 DIAGNOSIS — Z9109 Other allergy status, other than to drugs and biological substances: Secondary | ICD-10-CM

## 2023-07-09 DIAGNOSIS — E213 Hyperparathyroidism, unspecified: Secondary | ICD-10-CM

## 2023-07-09 DIAGNOSIS — Z1211 Encounter for screening for malignant neoplasm of colon: Secondary | ICD-10-CM

## 2023-07-09 DIAGNOSIS — M109 Gout, unspecified: Secondary | ICD-10-CM

## 2023-07-09 DIAGNOSIS — F5101 Primary insomnia: Secondary | ICD-10-CM

## 2023-07-09 DIAGNOSIS — R195 Other fecal abnormalities: Secondary | ICD-10-CM | POA: Insufficient documentation

## 2023-07-09 DIAGNOSIS — K219 Gastro-esophageal reflux disease without esophagitis: Secondary | ICD-10-CM

## 2023-07-09 DIAGNOSIS — I10 Essential (primary) hypertension: Secondary | ICD-10-CM | POA: Diagnosis not present

## 2023-07-09 DIAGNOSIS — J432 Centrilobular emphysema: Secondary | ICD-10-CM

## 2023-07-09 MED ORDER — VENLAFAXINE HCL 75 MG PO TABS: 75.0000 mg | ORAL_TABLET | Freq: Three times a day (TID) | ORAL | 0 refills | Status: DC

## 2023-07-09 NOTE — Assessment & Plan Note (Signed)
 Stable, currently on Effexor 75 mg 3 times daily, Zyprexa 15 mg, BuSpar 30 mg twice daily and propranolol 20 mg as needed.  Effexor due for refills.

## 2023-07-09 NOTE — Assessment & Plan Note (Signed)
 Stable, doing well on Trelegy daily and albuterol as needed.  Vaccines up-to-date.  Lung cancer screening up-to-date.

## 2023-07-09 NOTE — Assessment & Plan Note (Signed)
 Has not had a gout flare in 2 years but has colchicine to take as needed.

## 2023-07-09 NOTE — Assessment & Plan Note (Signed)
 Last A1c normal, will continue to monitor and recheck with annual labs at follow-up.

## 2023-07-09 NOTE — Assessment & Plan Note (Signed)
 Stable on statin.

## 2023-07-09 NOTE — Assessment & Plan Note (Signed)
 Stable on Pepcid 20 mg.

## 2023-07-09 NOTE — Assessment & Plan Note (Signed)
 On Ambien and trazodone, will discontinue trazodone.

## 2023-07-09 NOTE — Assessment & Plan Note (Signed)
 Plan to recheck fasting labs at follow-up, doing well on statin.

## 2023-07-09 NOTE — Assessment & Plan Note (Signed)
 Currently on an antihistamine over-the-counter and Singulair.

## 2023-07-09 NOTE — Assessment & Plan Note (Signed)
Referral placed to GI for colonoscopy.

## 2023-07-09 NOTE — Assessment & Plan Note (Signed)
 Blood pressure stable here today, no changes made to medications and appropriate refills sent to pharmacy.

## 2023-07-09 NOTE — Progress Notes (Signed)
 New Patient Office Visit  Subjective    Patient ID: Stephanie Ball, female    DOB: 05/21/1955  Age: 68 y.o. MRN: [id number]  CC:  Chief Complaint  Patient presents with   Establish Care    HPI Stephanie Ball presents to establish care.  She is here with her husband.  Hypertension: -Medications: Amlodipine 5 mg, Lisinopril 40 mg,  -Patient is compliant with above medications and reports no side effects. -Denies any SOB, CP, vision changes, LE edema or symptoms of hypotension  HLD: -Medications: Crestor 20 mg, aspirin   -Patient is compliant with above medications and reports no side effects.  -Last lipid panel: Lipid Panel     Component Value Date/Time   CHOL 153 09/15/2022 0000   CHOL 194 11/09/2018 1420   TRIG 69 09/15/2022 0000   HDL 74 (A) 09/15/2022 0000   HDL 42 11/09/2018 1420   CHOLHDL 4.6 12/24/2020 0722   LDLCALC 64 09/15/2022 0000   LDLCALC 98 12/24/2020 0722   LABVLDL 73 (H) 11/09/2018 1420    COPD: -COPD status: stable -Current medications: Trelegy, Albuterol PRN -Satisfied with current treatment?: yes -Oxygen use: no -Dyspnea frequency: rarely -Cough frequency: sometimes -Rescue inhaler frequency:  rarely  -Limitation of activity: no -Pneumovax: Up to Date -Influenza: Up to Date  Bipolar 1/Anxiety/Insomnia: -Currently on Effexor 75 mg TID, Zyprexa 15 mg, Buspar 30 mg BID, Trazodone 200 mg, Ambien 10 mg at bedtime, Propanolol 20 mg PRN (takes rarely) -Patient states that her moods are stable  GERD: -Currently taking Pepcid 20 mg, symptoms well controlled   Environmental Allergies: -Currently OTC Zyrtec 10 mg, symptoms controlled   CKD3b: -Following with 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   amLODipine (NORVASC) 5 MG tablet TAKE 1 TABLET (5 MG TOTAL) BY MOUTH DAILY.   busPIRone (BUSPAR) 30 MG tablet Take 1 tablet (30 mg total) by mouth 2 (two) times daily.   clotrimazole-betamethasone (LOTRISONE) cream Apply topically 2 (two) times daily.   colchicine 0.6 MG tablet Take 2 tabs PO at onset of symptoms, and one tab PO daily until symptoms resolve. Repeat as needed   famotidine (PEPCID) 20 MG tablet Take 20 mg by mouth 2 (two) times daily.   Fluticasone-Umeclidin-Vilant (TRELEGY ELLIPTA) 100-62.5-25 MCG/ACT AEPB Inhale 1 puff into the lungs daily.   hydrOXYzine (ATARAX) 25 MG tablet Take 1 tablet (25 mg total) by mouth 3 (three) times daily as needed. TAKE 1 TABLET BY MOUTH THREE TIMES A DAY AS NEEDED   lisinopril (ZESTRIL) 40 MG tablet Take 1 tablet (40 mg total) by mouth daily.   montelukast (SINGULAIR) 10 MG tablet TAKE 1 TABLET BY MOUTH EVERY DAY   OLANZapine (ZYPREXA) 15 MG tablet TAKE 1 TABLET (15 MG TOTAL) BY MOUTH DAILY.   propranolol (INDERAL) 20 MG tablet Take 1 tablet (20 mg total) by mouth daily as needed (anxiety).  rosuvastatin (CRESTOR) 20 MG tablet TAKE 1 TABLET BY MOUTH EVERY DAY   zolpidem (AMBIEN) 10 MG tablet Take 1 tablet (10 mg total) by mouth at bedtime as needed for sleep.   [DISCONTINUED] lovastatin (MEVACOR) 40 MG tablet Take 40 mg by mouth at bedtime.   [DISCONTINUED] traZODone (DESYREL) 100 MG tablet Take 2 tablets (200 mg total) by mouth at bedtime.   [DISCONTINUED] venlafaxine (EFFEXOR) 75 MG tablet TAKE 1 TABLET (75 MG TOTAL) BY MOUTH 3  (THREE) TIMES DAILY WITH MEALS.   venlafaxine (EFFEXOR) 75 MG tablet Take 1 tablet (75 mg total) by mouth 3 (three) times daily with meals.   [DISCONTINUED] benzonatate (TESSALON) 100 MG capsule Take 1 capsule (100 mg total) by mouth 2 (two) times daily as needed for cough. (Patient not taking: Reported on 07/09/2023)   No facility-administered encounter medications on file as of 07/09/2023.    Past Medical History:  Diagnosis Date   Allergy    Bipolar disorder (HCC) 08/24/2008   Cataract    Chronic kidney disease    Depressive disorder 08/24/2008   Disorder of kidney and ureter 07/19/2009   Fibrocystic breast disease    GERD (gastroesophageal reflux disease)    Hematuria 08/24/2008   Hydronephrosis of left kidney    Hypercalcemia 07/19/2009   Hyperglycemia    Hyperlipidemia 08/24/2008   Hypertension 08/24/2008   essential, benign   Hypokalemia    Panic attack    Pure hypercholesterolemia 01/29/2009   Seasonal allergic rhinitis     Past Surgical History:  Procedure Laterality Date   ABDOMINAL HYSTERECTOMY     abdominal:due to fibroid and endometriosis. No history of abnormal paps. Ovaries removed also.   ARTHRODESIS METATARSALPHALANGEAL JOINT (MTPJ) Left 11/19/2022   Procedure: ARTHRODESIS METATARSALPHALANGEAL JOINT (MTPJ);  Surgeon: Stephanie Ball, DPM;  Location[facility];  Service: Orthopedics/Podiatry;  Laterality: Left;   BREAST BIOPSY Right 2012   FIBROEPITHEIAL LESION WITH FLORID DUCTAL   CESAREAN SECTION     CHOLECYSTECTOMY     no further information given   EYE SURGERY     KIDNEY SURGERY     as stated pt had kidney surgery with no further given   OOPHORECTOMY Bilateral 1996   Dr, DeFrancisco   TUBAL LIGATION      Family History  Problem Relation Age of Onset   Stephanie Ball Mother    Diabetes Father    Alcohol abuse Father    Cancer Paternal Grandmother        skin   Healthy Sister    Healthy Daughter    Healthy Sister    Breast cancer Neg Hx      Social History   Socioeconomic History   Marital status: Married    Spouse name: Not on file   Number of children: Not on file   Years of education: Not on file   Highest education level: 12th grade  Occupational History   Not on file  Tobacco Use   Smoking status: Some Days    Current packs/day: 0.00    Average packs/day: 0.5 packs/day for 40.0 years (20.0 ttl pk-yrs)    Types: Cigarettes    Start date: 04/09/1983    Last attempt to quit: 08/28/2022    Years since quitting: 0.8   Smokeless tobacco: Never  Vaping Use   Vaping status: Never Used  Substance and Sexual Activity   Alcohol use: No   Drug use: No   Sexual activity: Yes    Birth control/protection: None  Other  Topics Concern   Not on file  Social History Narrative   Not on file   Social Drivers of Health   Financial Resource Strain: Low Risk  (06/04/2023)   Received from [facility]   Overall Financial Resource Strain (CARDIA)    Difficulty of Paying Living Expenses: Not very hard  Food Insecurity: No Food Insecurity (06/04/2023)   [facility] from [facility]   Hunger Vital Sign    Worried About Running Out of Food in the Last Year: Never true    Ran Out of Food in the Last Year: Never true  Transportation Needs: No Transportation Needs (06/04/2023)   Received from [facility] - Transportation    In the past 12 months, has lack of transportation kept you from medical appointments or from getting medications?: No    Lack of Transportation (Non-Medical): No  Physical Activity: Insufficiently Active (04/13/2023)   Exercise Vital Sign    Days of Exercise per Week: 4 days    Minutes of Exercise per Session: 20 min  Stress: No Stress Concern Present (04/13/2023)   [employer] of Occupational Health - Occupational Stress Questionnaire    Feeling of Stress : Only a little  Social Connections: Moderately Integrated (04/13/2023)   Social  Connection and Isolation Panel [NHANES]    Frequency of Communication with Friends and Family: More than three times a week    Frequency of Social Gatherings with Friends and Family: More than three times a week    Attends Religious Services: More than 4 times per year    Active Member of [employer] or Organizations: No    Attends Engineer, structural: Not on file    Marital Status: Married  Catering manager Violence: Not on file    Review of Systems  All other systems reviewed and are negative.       Objective    BP 122/78 (Cuff Size: Large)   Pulse 90   Temp 97.8 F (36.6 C) (Oral)   Resp 16   Ht 5' (1.524 m)   Wt 173 lb (78.5 kg)   SpO2 98%   BMI 33.79 kg/m   Physical Exam Constitutional:      Appearance: Normal appearance.  HENT:     Head: Normocephalic and atraumatic.     Mouth/Throat:     Mouth: Mucous membranes are moist.     Pharynx: Oropharynx is clear.  Eyes:     Extraocular Movements: Extraocular movements intact.     Conjunctiva/sclera: Conjunctivae normal.     Pupils: Pupils are equal, round, and reactive to light.  Cardiovascular:     Rate and Rhythm: Normal rate and regular rhythm.  Pulmonary:     Effort: Pulmonary effort is normal.     Breath sounds: Normal breath sounds.  Musculoskeletal:     Right lower leg: No edema.     Left lower leg: No edema.  Skin:    General: Skin is warm and dry.  Neurological:     General: No focal deficit present.     Mental Status: She is alert. Mental status is at baseline.  Psychiatric:        Mood and Affect: Mood normal.        Behavior: Behavior normal.     Last CBC Lab Results  Component Value Date   WBC 9.5 02/17/2023   HGB 11.9 02/17/2023   HCT 35.4 02/17/2023   MCV 86 02/17/2023   MCH  28.7 02/17/2023   RDW 14.2 02/17/2023   PLT 353 02/17/2023   Last metabolic panel Lab Results  Component Value Date   GLUCOSE 116 (H) 02/17/2023   NA 145 (H) 02/17/2023   K 3.0 (L) 02/17/2023   CL 108  (H) 02/17/2023   CO2 22 02/17/2023   BUN 14 02/17/2023   CREATININE 1.54 (H) 02/17/2023   EGFR 37 (L) 02/17/2023   CALCIUM 10.3 02/17/2023   PROT 6.4 02/17/2023   ALBUMIN 3.9 02/17/2023   LABGLOB 2.5 02/17/2023   AGRATIO 1.8 11/09/2018   BILITOT 0.5 02/17/2023   ALKPHOS 105 02/17/2023   AST 13 02/17/2023   ALT 7 02/17/2023   ANIONGAP 11 02/01/2023   Last lipids Lab Results  Component Value Date   CHOL 153 09/15/2022   HDL 74 (A) 09/15/2022   LDLCALC 64 09/15/2022   TRIG 69 09/15/2022   CHOLHDL 4.6 12/24/2020   Last hemoglobin A1c Lab Results  Component Value Date   HGBA1C 5.5 02/17/2023   Last thyroid functions Lab Results  Component Value Date   TSH 1.230 11/16/2022   Last vitamin D Lab Results  Component Value Date   VD25OH 129 (H) 12/24/2020   Last vitamin B12 and Folate Lab Results  Component Value Date   VITAMINB12 148 (L) 12/24/2020        Assessment & Plan:  Primary hypertension Assessment & Plan: Blood pressure stable here today, no changes made to medications and appropriate refills sent to pharmacy.     Hypercholesterolemia without hypertriglyceridemia Assessment & Plan: Stable on statin.   Coronary artery disease involving native coronary artery of native heart without angina pectoris Assessment & Plan: Plan to recheck fasting labs at follow-up, doing well on statin.   Centrilobular emphysema (HCC) Assessment & Plan: Stable, doing well on Trelegy daily and albuterol as needed.  Vaccines up-to-date.  Lung cancer screening up-to-date.   Bipolar disorder in full remission, most recent episode unspecified type [facility]) Assessment & Plan: Stable, currently on Effexor 75 mg 3 times daily, Zyprexa 15 mg, BuSpar 30 mg twice daily and propranolol 20 mg as needed.  Effexor due for refills.  Orders: -     Venlafaxine HCl; Take 1 tablet (75 mg total) by mouth 3 (three) times daily with meals.  Dispense: 270 tablet; Refill: 0  Primary  insomnia Assessment & Plan: On Ambien and trazodone, will discontinue trazodone.   Gastroesophageal reflux disease, unspecified whether esophagitis present Assessment & Plan: Stable on Pepcid 20 mg.   Environmental allergies Assessment & Plan: Currently on an antihistamine over-the-counter and Singulair.   Stage 3b chronic kidney disease (HCC) Assessment & Plan: With nephrology, reviewed medications and discussed with the patient increasing oral hydration decreasing sodium intake.  Patient also avoiding nephrotoxic agents.   Hyperparathyroidism [facility]) Assessment & Plan: Diagnosed due to hypercalcemia, following with endocrinology, planning thyroid ultrasound soon.   Prediabetes Assessment & Plan: Last A1c normal, will continue to monitor and recheck with annual labs at follow-up.   Gout, unspecified cause, unspecified chronicity, unspecified site Assessment & Plan: Has not had a gout flare in 2 years but has colchicine to take as needed.   Positive colorectal cancer screening using Cologuard test Assessment & Plan: Referral placed to GI for colonoscopy.  Orders: -     Ambulatory referral to Gastroenterology  Colon cancer screening -     Ambulatory referral to Gastroenterology    Return in about 2 months (around 09/21/2023) for physical.   Margarita Mail, DO

## 2023-07-09 NOTE — Assessment & Plan Note (Signed)
 Diagnosed due to hypercalcemia, following with endocrinology, planning thyroid ultrasound soon.

## 2023-07-09 NOTE — Assessment & Plan Note (Signed)
 With nephrology, reviewed medications and discussed with the patient increasing oral hydration decreasing sodium intake.  Patient also avoiding nephrotoxic agents.

## 2023-07-12 ENCOUNTER — Other Ambulatory Visit: Payer: Self-pay | Admitting: Internal Medicine

## 2023-07-13 ENCOUNTER — Other Ambulatory Visit: Payer: Self-pay | Admitting: Internal Medicine

## 2023-07-13 NOTE — Telephone Encounter (Signed)
 Requested medications are due for refill today.  no  Requested medications are on the active medications list.  yes  Last refill. 04/14/2023 #90 1 rf  Future visit scheduled.   yes  Notes to clinic.  Refill/refusal not delegated.    Requested Prescriptions  Pending Prescriptions Disp Refills   zolpidem (AMBIEN) 10 MG tablet [Pharmacy Med Name: ZOLPIDEM TARTRATE 10 MG TABLET] 90 tablet 1    Sig: TAKE 1 TABLET BY MOUTH AT BEDTIME AS NEEDED FOR SLEEP.     Not Delegated - Psychiatry:  Anxiolytics/Hypnotics Failed - 07/13/2023  3:03 PM      Failed - This refill cannot be delegated      Failed - Urine Drug Screen completed in last 360 days      Passed - Valid encounter within last 6 months    Recent Outpatient Visits           2 months ago COPD exacerbation [facility])   [facility] Margarita Mail, DO   3 months ago Hyperparathyroidism [facility])   [facility] Jacky Kindle, FNP   4 months ago Hospital discharge follow-up   [facility] Merita Norton T, FNP   8 months ago Panic attacks   [facility] Jacky Kindle, FNP   10 months ago Annual physical exam   [facility] Jacky Kindle, FNP       Future Appointments             In 2 months Margarita Mail, DO [facility]   In 3 months Althea Charon, Netta Neat, DO  [facility], [facility]

## 2023-07-14 ENCOUNTER — Other Ambulatory Visit: Payer: Self-pay | Admitting: Internal Medicine

## 2023-07-14 ENCOUNTER — Encounter: Payer: Self-pay | Admitting: Internal Medicine

## 2023-07-14 DIAGNOSIS — I10 Essential (primary) hypertension: Secondary | ICD-10-CM

## 2023-07-14 MED ORDER — AMLODIPINE BESYLATE 5 MG PO TABS: 5.0000 mg | ORAL_TABLET | Freq: Every day | ORAL | 1 refills | Status: DC

## 2023-07-14 NOTE — Telephone Encounter (Signed)
 Refill request from [facility]  amLODipine (NORVASC) 5 MG tablet

## 2023-07-15 ENCOUNTER — Other Ambulatory Visit: Payer: Self-pay | Admitting: Internal Medicine

## 2023-07-15 DIAGNOSIS — I2510 Atherosclerotic heart disease of native coronary artery without angina pectoris: Secondary | ICD-10-CM

## 2023-07-15 DIAGNOSIS — E7800 Pure hypercholesterolemia, unspecified: Secondary | ICD-10-CM

## 2023-07-15 MED ORDER — ROSUVASTATIN CALCIUM 20 MG PO TABS: 20.0000 mg | ORAL_TABLET | Freq: Every day | ORAL | 1 refills | Status: DC

## 2023-07-16 ENCOUNTER — Other Ambulatory Visit: Payer: Self-pay

## 2023-07-16 ENCOUNTER — Telehealth: Payer: Self-pay

## 2023-07-16 DIAGNOSIS — Z1211 Encounter for screening for malignant neoplasm of colon: Secondary | ICD-10-CM

## 2023-07-16 DIAGNOSIS — R195 Other fecal abnormalities: Secondary | ICD-10-CM

## 2023-07-16 MED ORDER — SUTAB 1479-225-188 MG PO TABS: 12.0000 | ORAL_TABLET | Freq: Two times a day (BID) | ORAL | 0 refills | Status: AC

## 2023-07-16 NOTE — Telephone Encounter (Signed)
 Gastroenterology Pre-Procedure Review  Patient requested to use Sutab for her colonoscopy prep due to inability to tolerate liquid prep. She has been made aware of allergy contraindication to potassium chloride ingredient in Sutab  with reaction being nausea. She has requested to still have Sutab prep sent to her pharmacy as opposed to liquid prep.   Request Date: 08/11/23 Requesting Physician: Dr. Tobi Bastos  PATIENT REVIEW QUESTIONS: The patient responded to the following health history questions as indicated:    1. Are you having any GI issues? no 1st colonoscopy, positive cologuard 2. Do you have a personal history of Polyps? no 3. Do you have a family history of Colon Cancer or Polyps? no 4. Diabetes Mellitus? no 5. Joint replacements in the past 12 months?no 6. Major health problems in the past 3 months?no 7. Any artificial heart valves, MVP, or defibrillator?no    MEDICATIONS & ALLERGIES:    Patient reports the following regarding taking any anticoagulation/antiplatelet therapy:   Plavix, Coumadin, Eliquis, Xarelto, Lovenox, Pradaxa, Brilinta, or Effient? no Aspirin? no  Patient confirms/reports the following medications:  Current Outpatient Medications  Medication Sig Dispense Refill   albuterol (VENTOLIN HFA) 108 (90 Base) MCG/ACT inhaler Inhale 2 puffs into the lungs every 6 (six) hours as needed for wheezing or shortness of breath. 8 g 2   amLODipine (NORVASC) 5 MG tablet Take 1 tablet (5 mg total) by mouth daily. 90 tablet 1   busPIRone (BUSPAR) 30 MG tablet Take 1 tablet (30 mg total) by mouth 2 (two) times daily. 180 tablet 1   clotrimazole-betamethasone (LOTRISONE) cream Apply topically 2 (two) times daily. 30 g 1   colchicine 0.6 MG tablet Take 2 tabs PO at onset of symptoms, and one tab PO daily until symptoms resolve. Repeat as needed 90 tablet 1   famotidine (PEPCID) 20 MG tablet Take 20 mg by mouth 2 (two) times daily.     Fluticasone-Umeclidin-Vilant (TRELEGY ELLIPTA)  100-62.5-25 MCG/ACT AEPB Inhale 1 puff into the lungs daily. 1 each 11   hydrOXYzine (ATARAX) 25 MG tablet Take 1 tablet (25 mg total) by mouth 3 (three) times daily as needed. TAKE 1 TABLET BY MOUTH THREE TIMES A DAY AS NEEDED 60 tablet 0   lisinopril (ZESTRIL) 40 MG tablet Take 1 tablet (40 mg total) by mouth daily. 90 tablet 0   montelukast (SINGULAIR) 10 MG tablet TAKE 1 TABLET BY MOUTH EVERY DAY 90 tablet 3   OLANZapine (ZYPREXA) 15 MG tablet TAKE 1 TABLET (15 MG TOTAL) BY MOUTH DAILY. 90 tablet 3   propranolol (INDERAL) 20 MG tablet Take 1 tablet (20 mg total) by mouth daily as needed (anxiety). 90 tablet 3   rosuvastatin (CRESTOR) 20 MG tablet Take 1 tablet (20 mg total) by mouth daily. 90 tablet 1   venlafaxine (EFFEXOR) 75 MG tablet Take 1 tablet (75 mg total) by mouth 3 (three) times daily with meals. 270 tablet 0   zolpidem (AMBIEN) 10 MG tablet Take 1 tablet (10 mg total) by mouth at bedtime as needed for sleep. 90 tablet 1   No current facility-administered medications for this visit.    Patient confirms/reports the following allergies:  Allergies  Allergen Reactions   Aciphex  [Rabeprazole Sodium]    Amoxicillin-Pot Clavulanate    Iron    Macrobid  [Nitrofurantoin Monohyd Macro]    Nitrofurantoin Monohyd Macro    Potassium Chloride Nausea Only    No orders of the defined types were placed in this encounter.   AUTHORIZATION INFORMATION  Primary Insurance: 1D#: Group #:  Secondary Insurance: 1D#: Group #:  SCHEDULE INFORMATION: Date: 08/11/23 Time: Location: [facility]

## 2023-07-21 ENCOUNTER — Ambulatory Visit: Admitting: Physician Assistant

## 2023-08-04 ENCOUNTER — Encounter: Payer: Self-pay | Admitting: Gastroenterology

## 2023-08-11 ENCOUNTER — Encounter
Admission: RE | Disposition: A | Discharge status: Home / Self Care | Payer: Self-pay | Source: Home / Self Care | Attending: Gastroenterology

## 2023-08-11 ENCOUNTER — Ambulatory Visit
Admission: RE | Admit: 2023-08-11 | Discharge: 2023-08-11 | Disposition: A | Discharge status: Home / Self Care | Attending: Gastroenterology | Admitting: Gastroenterology

## 2023-08-11 DIAGNOSIS — Z1211 Encounter for screening for malignant neoplasm of colon: Secondary | ICD-10-CM | POA: Diagnosis present

## 2023-08-11 DIAGNOSIS — Z539 Procedure and treatment not carried out, unspecified reason: Secondary | ICD-10-CM | POA: Diagnosis not present

## 2023-08-11 DIAGNOSIS — R195 Other fecal abnormalities: Secondary | ICD-10-CM | POA: Diagnosis not present

## 2023-08-11 SURGERY — COLONOSCOPY
Anesthesia: General

## 2023-08-11 MED ORDER — SODIUM CHLORIDE 0.9 % IV SOLN: INTRAVENOUS | Status: DC

## 2023-08-11 NOTE — OR Nursing (Signed)
 Patient could not keep her prep down and not clean. Dr Tobi Bastos told her to call the office, ask for a new prep and Zofran and reschedule

## 2023-08-11 NOTE — H&P (Signed)
 Didn't complete prep procedure rescheduled

## 2023-08-25 ENCOUNTER — Other Ambulatory Visit: Payer: Self-pay | Admitting: Physician Assistant

## 2023-08-25 DIAGNOSIS — F410 Panic disorder [episodic paroxysmal anxiety] without agoraphobia: Secondary | ICD-10-CM

## 2023-08-25 MED ORDER — HYDROXYZINE HCL 25 MG PO TABS: 25.0000 mg | ORAL_TABLET | Freq: Three times a day (TID) | ORAL | 0 refills | Status: DC | PRN

## 2023-09-07 ENCOUNTER — Other Ambulatory Visit: Payer: Self-pay | Admitting: Physician Assistant

## 2023-09-07 DIAGNOSIS — F410 Panic disorder [episodic paroxysmal anxiety] without agoraphobia: Secondary | ICD-10-CM

## 2023-10-06 ENCOUNTER — Encounter: Payer: Self-pay | Admitting: Internal Medicine

## 2023-10-06 ENCOUNTER — Other Ambulatory Visit: Payer: Self-pay

## 2023-10-06 ENCOUNTER — Ambulatory Visit (INDEPENDENT_AMBULATORY_CARE_PROVIDER_SITE_OTHER): Admitting: Internal Medicine

## 2023-10-06 VITALS — BP 124/80 | HR 100 | Temp 98.0°F | Resp 16 | Ht 60.0 in | Wt 154.5 lb

## 2023-10-06 DIAGNOSIS — Z13220 Encounter for screening for lipoid disorders: Secondary | ICD-10-CM | POA: Diagnosis not present

## 2023-10-06 DIAGNOSIS — G4700 Insomnia, unspecified: Secondary | ICD-10-CM

## 2023-10-06 DIAGNOSIS — Z0000 Encounter for general adult medical examination without abnormal findings: Secondary | ICD-10-CM

## 2023-10-06 DIAGNOSIS — Z1231 Encounter for screening mammogram for malignant neoplasm of breast: Secondary | ICD-10-CM | POA: Diagnosis not present

## 2023-10-06 MED ORDER — ZOLPIDEM TARTRATE 10 MG PO TABS: 10.0000 mg | ORAL_TABLET | Freq: Every evening | ORAL | 0 refills | Status: DC | PRN

## 2023-10-06 NOTE — Progress Notes (Signed)
 Name: Stephanie Ball   MRN: [id number]    DOB: Dec 07, 1955   Date:10/06/2023       Progress Note  Subjective  Chief Complaint  Chief Complaint  Patient presents with   Annual Exam    HPI  Patient presents for annual CPE.  Discussed the use of AI scribe software for clinical note transcription with the patient, who gave verbal consent to proceed.  History of Present Illness Stephanie Ball is a 68 year old female who presents for an annual physical exam. She is accompanied by her husband.  She is scheduled for parathyroid surgery in August due to calcium  level issues and is under nephrology care. Recent labs showed slightly elevated triglycerides, with normal LDL and A1c levels. A follow-up with nephrology is planned.  She underwent laser eye surgery last month for vision correction after cataract surgery, eliminating her need for glasses, and is satisfied with the outcome.  She attempted a colonoscopy twice after a positive Cologuard test last fall but could not tolerate the preparation due to nausea and vomiting. Her last attempt was two months ago.  She has been smoking since age 24 and had lung cancer screening last October.  She had a full hysterectomy approximately 25 years ago for a suspected cancerous mass and severe menstrual periods. She reports no current gynecological issues or urinary incontinence.  She takes 10 mg of Ambien  and trazodone  for sleep, having reduced trazodone  from two pills to one.    Diet: regular well balanced  Exercise: none  Last Eye Exam: completed scheduled this afternoon  Last Dental Exam: completed  Flowsheet Row Office Visit from 10/06/2023 in [facility]  AUDIT-C Score 0      Depression: Phq 9 is  negative    10/06/2023    8:01 AM 07/09/2023    1:10 PM 04/14/2023    8:07 AM 11/10/2022    8:36 AM 09/02/2022   10:54 AM  Depression screen PHQ 2/9  Decreased Interest 0 0 0 0 0  Down, Depressed, Hopeless 0 0 0 0 0   PHQ - 2 Score 0 0 0 0 0  Altered sleeping 0  0 0 0  Tired, decreased energy 0  0 0 0  Change in appetite 0  0 0 0  Feeling bad or failure about yourself  0  0 0 0  Trouble concentrating 0  0 0 0  Moving slowly or fidgety/restless 0  0 0 0  Suicidal thoughts 0  0 0 0  PHQ-9 Score 0  0 0 0  Difficult doing work/chores Not difficult at all  Not difficult at all Not difficult at all Not difficult at all   Hypertension: BP Readings from Last 3 Encounters:  10/06/23 124/80  07/09/23 122/78  06/07/23 (!) 140/95   Obesity: Wt Readings from Last 3 Encounters:  10/06/23 154 lb 8 oz (70.1 kg)  07/09/23 173 lb (78.5 kg)  06/07/23 158 lb (71.7 kg)   BMI Readings from Last 3 Encounters:  10/06/23 30.17 kg/m  08/11/23 (P) 33.79 kg/m  07/09/23 33.79 kg/m     Vaccines UTD: reviewed with the patient.   Hep C Screening: completed STD testing and prevention (HIV/chl/gon/syphilis): no concerns Intimate partner violence: negative screen  Menstrual History/LMP/Abnormal Bleeding: 25 years ago - full hysterectomy due to heavy bleeding and mass that turned out not to be cancer  Discussed importance of follow up if any post-menopausal bleeding: yes Incontinence Symptoms: negative for symptoms   Breast  cancer:  - Last Mammogram: 09/10/2022; due and ordered  Osteoporosis Prevention : Discussed high calcium  and vitamin D  supplementation, weight bearing exercises Bone density :yes 09/09/2022  Cervical cancer screening: not applicable due to hysterectomy  Skin cancer: Discussed monitoring for atypical lesions  Colorectal cancer: Cologuard positive 02/24/2023   Lung cancer:  Low Dose CT Chest recommended if Age 101-80 years, 20 pack-year currently smoking OR have quit w/in 15years. Patient does qualify for screen   ECG: 12/19/21  Advanced Care Planning: A voluntary discussion about advance care planning including the explanation and discussion of advance directives.  Discussed health care proxy  and Living will, and the patient was able to identify a health care proxy as Stephanie Ball (husband).  Patient does not have a living will and power of attorney of health care   Patient Active Problem List   Diagnosis Date Noted   Bipolar disorder in full remission (HCC) 07/09/2023   Environmental allergies 07/09/2023   Positive colorectal cancer screening using Cologuard test 07/09/2023   Hyperparathyroidism (HCC) 04/14/2023   Centrilobular emphysema (HCC) 04/14/2023   Aortic atherosclerosis (HCC) 04/14/2023   Coronary artery disease involving native coronary artery of native heart without angina pectoris 04/14/2023   Prediabetes 02/17/2023   Tobacco dependence 02/17/2023   Gastroesophageal reflux disease 01/05/2023   Bipolar 1 disorder (HCC) 02/25/2021   Stage 3b chronic kidney disease (HCC) 12/18/2020   Gout 02/10/2016   Insomnia 12/07/2014   Hypercholesterolemia without hypertriglyceridemia 01/29/2009   Primary hypertension 08/24/2008    Past Surgical History:  Procedure Laterality Date   ABDOMINAL HYSTERECTOMY     abdominal:due to fibroid and endometriosis. No history of abnormal paps. Ovaries removed also.   ARTHRODESIS METATARSALPHALANGEAL JOINT (MTPJ) Left 11/19/2022   Procedure: ARTHRODESIS METATARSALPHALANGEAL JOINT (MTPJ);  Surgeon: Pink Bridges, DPM;  [facility];  Service: Orthopedics/Podiatry;  Laterality: Left;   BREAST BIOPSY Right 2012   FIBROEPITHEIAL LESION WITH FLORID DUCTAL   CESAREAN SECTION     CHOLECYSTECTOMY     no further information given   EYE SURGERY     KIDNEY SURGERY     as stated pt had kidney surgery with no further given   OOPHORECTOMY Bilateral 1996   Dr, DeFrancisco   TUBAL LIGATION      Family History  Problem Relation Age of Onset   Anuerysm Mother    Diabetes Father    Alcohol abuse Father    Cancer Paternal Grandmother        skin   Healthy Sister    Healthy Daughter    Healthy Sister    Breast cancer Neg  Hx     Social History   Socioeconomic History   Marital status: Married    Spouse name: Not on file   Number of children: Not on file   Years of education: Not on file   Highest education level: 12th grade  Occupational History   Not on file  Tobacco Use   Smoking status: Some Days    Current packs/day: 0.00    Average packs/day: 0.5 packs/day for 40.0 years (20.0 ttl pk-yrs)    Types: Cigarettes    Start date: 04/09/1983    Last attempt to quit: 08/28/2022    Years since quitting: 1.1   Smokeless tobacco: Never  Vaping Use   Vaping status: Never Used  Substance and Sexual Activity   Alcohol use: No   Drug use: No   Sexual activity: Yes    Birth control/protection: None  Other Topics Concern   Not on file  Social History Narrative   Not on file   Social Drivers of Health   Financial Resource Strain: Low Risk  (10/06/2023)   Overall Financial Resource Strain (CARDIA)    Difficulty of Paying Living Expenses: Not very hard  Food Insecurity: No Food Insecurity (10/06/2023)   Hunger Vital Sign    Worried About Running Out of Food in the Last Year: Never true    Ran Out of Food in the Last Year: Never true  Transportation Needs: No Transportation Needs (06/04/2023)   [facility] from [facility] - [facility]    In the past 12 months, has lack of transportation kept you from medical appointments or from getting medications?: No    Lack of Transportation (Non-Medical): No  Physical Activity: Inactive (10/06/2023)   Exercise Vital Sign    Days of Exercise per Week: 0 days    Minutes of Exercise per Session: 0 min  Stress: No Stress Concern Present (10/06/2023)   [employer] of Occupational Health - Occupational Stress Questionnaire    Feeling of Stress : Only a little  Social Connections: Moderately Integrated (10/06/2023)   Social Connection and Isolation Panel [NHANES]    Frequency of Communication with Friends and Family: More than three times  a week    Frequency of Social Gatherings with Friends and Family: More than three times a week    Attends Religious Services: More than 4 times per year    Active Member of [employer] or Organizations: No    Attends Banker Meetings: Never    Marital Status: Married  Catering manager Violence: Not At Risk (10/06/2023)   Humiliation, Afraid, Rape, and Kick questionnaire    Fear of Current or Ex-Partner: No    Emotionally Abused: No    Physically Abused: No    Sexually Abused: No     Current Outpatient Medications:    albuterol  (VENTOLIN  HFA) 108 (90 Base) MCG/ACT inhaler, Inhale 2 puffs into the lungs every 6 (six) hours as needed for wheezing or shortness of breath., Disp: 8 g, Rfl: 2   amLODipine  (NORVASC ) 5 MG tablet, Take 1 tablet (5 mg total) by mouth daily., Disp: 90 tablet, Rfl: 1   busPIRone  (BUSPAR ) 30 MG tablet, Take 1 tablet (30 mg total) by mouth 2 (two) times daily., Disp: 180 tablet, Rfl: 1   clotrimazole -betamethasone  (LOTRISONE ) cream, Apply topically 2 (two) times daily., Disp: 30 g, Rfl: 1   colchicine  0.6 MG tablet, Take 2 tabs PO at onset of symptoms, and one tab PO daily until symptoms resolve. Repeat as needed, Disp: 90 tablet, Rfl: 1   famotidine (PEPCID) 20 MG tablet, Take 20 mg by mouth 2 (two) times daily., Disp: , Rfl:    Fluticasone-Umeclidin-Vilant (TRELEGY ELLIPTA ) 100-62.5-25 MCG/ACT AEPB, Inhale 1 puff into the lungs daily., Disp: 1 each, Rfl: 11   hydrOXYzine  (ATARAX ) 25 MG tablet, TAKE 1 TABLET (25 MG TOTAL) BY MOUTH 3 (THREE) TIMES DAILY AS NEEDED., Disp: 270 tablet, Rfl: 1   lisinopril  (ZESTRIL ) 40 MG tablet, Take 1 tablet (40 mg total) by mouth daily., Disp: 90 tablet, Rfl: 0   montelukast  (SINGULAIR ) 10 MG tablet, TAKE 1 TABLET BY MOUTH EVERY DAY, Disp: 90 tablet, Rfl: 3   OLANZapine  (ZYPREXA ) 15 MG tablet, TAKE 1 TABLET (15 MG TOTAL) BY MOUTH DAILY., Disp: 90 tablet, Rfl: 3   propranolol  (INDERAL ) 20 MG tablet, Take 1 tablet (20 mg total) by  mouth daily as needed (anxiety)., Disp: 90 tablet, Rfl: 3   rosuvastatin  (CRESTOR ) 20 MG tablet, Take 1 tablet (20 mg total) by mouth daily., Disp: 90 tablet, Rfl: 1   venlafaxine  (EFFEXOR ) 75 MG tablet, Take 1 tablet (75 mg total) by mouth 3 (three) times daily with meals., Disp: 270 tablet, Rfl: 0   zolpidem  (AMBIEN ) 10 MG tablet, Take 1 tablet (10 mg total) by mouth at bedtime as needed for sleep., Disp: 90 tablet, Rfl: 1  Allergies  Allergen Reactions   Aciphex  [Rabeprazole Sodium]    Amoxicillin-Pot Clavulanate    Iron    Macrobid  [Nitrofurantoin Monohyd Macro]    Nitrofurantoin Monohyd Macro    Potassium Chloride  Nausea Only     Review of Systems  All other systems reviewed and are negative.   Objective  Vitals:   10/06/23 0802  Pulse: 100  Resp: 16  Temp: 98 F (36.7 C)  TempSrc: Oral  SpO2: 95%  Weight: 154 lb 8 oz (70.1 kg)  Height: 5' (1.524 m)    Body mass index is 30.17 kg/m.  Physical Exam Constitutional:      Appearance: Normal appearance.  HENT:     Head: Normocephalic and atraumatic.     Mouth/Throat:     Mouth: Mucous membranes are moist.     Pharynx: Oropharynx is clear.  Eyes:     Extraocular Movements: Extraocular movements intact.     Conjunctiva/sclera: Conjunctivae normal.     Pupils: Pupils are equal, round, and reactive to light.  Neck:     Comments: No thyromegaly Cardiovascular:     Rate and Rhythm: Normal rate and regular rhythm.  Pulmonary:     Effort: Pulmonary effort is normal.     Breath sounds: Normal breath sounds.  Musculoskeletal:     Cervical back: No tenderness.     Right lower leg: No edema.     Left lower leg: No edema.  Lymphadenopathy:     Cervical: No cervical adenopathy.  Skin:    General: Skin is warm and dry.  Neurological:     General: No focal deficit present.     Mental Status: She is alert. Mental status is at baseline.  Psychiatric:        Mood and Affect: Mood normal.        Behavior: Behavior  normal.     Last CBC Lab Results  Component Value Date   WBC 9.5 02/17/2023   HGB 11.9 02/17/2023   HCT 35.4 02/17/2023   MCV 86 02/17/2023   MCH 28.7 02/17/2023   RDW 14.2 02/17/2023   PLT 353 02/17/2023   Last metabolic panel Lab Results  Component Value Date   GLUCOSE 116 (H) 02/17/2023   NA 145 (H) 02/17/2023   K 3.0 (L) 02/17/2023   CL 108 (H) 02/17/2023   CO2 22 02/17/2023   BUN 14 02/17/2023   CREATININE 1.54 (H) 02/17/2023   EGFR 37 (L) 02/17/2023   CALCIUM  10.3 02/17/2023   PROT 6.4 02/17/2023   ALBUMIN 3.9 02/17/2023   LABGLOB 2.5 02/17/2023   AGRATIO 1.8 11/09/2018   BILITOT 0.5 02/17/2023   ALKPHOS 105 02/17/2023   AST 13 02/17/2023   ALT 7 02/17/2023   ANIONGAP 11 02/01/2023   Last lipids Lab Results  Component Value Date   CHOL 153 09/15/2022   HDL 74 (A) 09/15/2022   LDLCALC 64 09/15/2022   TRIG 69 09/15/2022   CHOLHDL 4.6 12/24/2020   Last hemoglobin A1c Lab  Results  Component Value Date   HGBA1C 5.5 02/17/2023   Last thyroid  functions Lab Results  Component Value Date   TSH 1.230 11/16/2022   Last vitamin D  Lab Results  Component Value Date   VD25OH 129 (H) 12/24/2020   Last vitamin B12 and Folate Lab Results  Component Value Date   VITAMINB12 148 (L) 12/24/2020      Assessment & Plan  Assessment & Plan Parathyroid adenoma Scheduled for parathyroidectomy in August to address hypercalcemia. Plan to remove one parathyroid gland, decision based on intraoperative findings. - Proceed with scheduled parathyroidectomy in August.  Colorectal cancer screening Previous positive Cologuard test indicates potential colorectal pathology. Emphasized importance of completing colonoscopy due to risk of undetected cancerous polyps. Explore alternative preparation methods. - Advise contacting Gl for alternative colonoscopy preparation options after parathyroid surgery. - Emphasize importance of completing colonoscopy later in the  year.  Hyperlipidemia Triglycerides slightly elevated on labs from insurance, other cholesterol parameters normal.  Insomnia Current regimen of 10 mg Ambien  and 50 mg trazodone  effective. - Refill trazodone  prescription. - Schedule follow-up appointment in 90 days for medication review, can be virtual.  General Health Maintenance Blood pressure well-controlled. Weight loss due to dietary monitoring. Up-to-date on vaccinations. Due for mammogram. Continues to smoke, advised smoking cessation. - Order mammogram. - Encourage smoking cessation. - Continue regular lung cancer screening.  - MM 3D SCREENING MAMMOGRAM BILATERAL BREAST; Future - traZODone  (DESYREL ) 100 MG tablet; Take 1 tablet (100 mg total) by mouth at bedtime. - zolpidem  (AMBIEN ) 10 MG tablet; Take 1 tablet (10 mg total) by mouth at bedtime as needed for sleep.  Dispense: 90 tablet; Refill: 0   -USPSTF grade A and B recommendations reviewed with patient; age-appropriate recommendations, preventive care, screening tests, etc discussed and encouraged; healthy living encouraged; see AVS for patient education given to patient -Discussed importance of 150 minutes of physical activity weekly, eat two servings of fish weekly, eat one serving of tree nuts ( cashews, pistachios, pecans, almonds.Aaron Aas) every other day, eat 6 servings of fruit/vegetables daily and drink plenty of water and avoid sweet beverages.   -Reviewed Health Maintenance: Yes.

## 2023-10-19 ENCOUNTER — Ambulatory Visit: Payer: Self-pay | Admitting: Family Medicine

## 2023-11-22 ENCOUNTER — Telehealth: Payer: Self-pay | Admitting: Internal Medicine

## 2023-11-22 ENCOUNTER — Other Ambulatory Visit: Payer: Self-pay | Admitting: Internal Medicine

## 2023-11-22 ENCOUNTER — Other Ambulatory Visit: Payer: Self-pay | Admitting: Emergency Medicine

## 2023-11-22 DIAGNOSIS — F410 Panic disorder [episodic paroxysmal anxiety] without agoraphobia: Secondary | ICD-10-CM

## 2023-11-22 DIAGNOSIS — J302 Other seasonal allergic rhinitis: Secondary | ICD-10-CM

## 2023-11-22 MED ORDER — MONTELUKAST SODIUM 10 MG PO TABS: 10.0000 mg | ORAL_TABLET | Freq: Every day | ORAL | 3 refills | Status: AC

## 2023-11-22 NOTE — Telephone Encounter (Signed)
 [facility]  montelukast  (SINGULAIR ) 10 MG tablet

## 2023-11-22 NOTE — Telephone Encounter (Signed)
 Sent to Dr.Andrews for refills

## 2023-11-24 NOTE — Telephone Encounter (Signed)
 Requested Prescriptions  Pending Prescriptions Disp Refills   propranolol  (INDERAL ) 20 MG tablet [Pharmacy Med Name: PROPRANOLOL  20 MG TABLET] 90 tablet 3    Sig: TAKE 1 TABLET (20 MG TOTAL) BY MOUTH DAILY AS NEEDED (ANXIETY).     Cardiovascular:  Beta Blockers Passed - 11/24/2023  2:53 PM      Passed - Last BP in normal range    BP Readings from Last 1 Encounters:  10/06/23 124/80         Passed - Last Heart Rate in normal range    Pulse Readings from Last 1 Encounters:  10/06/23 100         Passed - Valid encounter within last 6 months    Recent Outpatient Visits           1 month ago Annual physical exam   [facility] Bernardo Fend, DO   4 months ago Primary hypertension   [facility] Bernardo Fend, DO   5 months ago Upper respiratory infection, acute   [facility] [place], [place], PA-C       Future Appointments             In 1 month Bernardo Fend, DO [facility], [facility]

## 2023-11-30 ENCOUNTER — Other Ambulatory Visit: Payer: Self-pay | Admitting: Internal Medicine

## 2023-11-30 NOTE — Telephone Encounter (Signed)
clotrimazole-betamethasone (LOTRISONE) cream

## 2023-12-01 MED ORDER — CLOTRIMAZOLE-BETAMETHASONE 1-0.05 % EX CREA: TOPICAL_CREAM | Freq: Two times a day (BID) | CUTANEOUS | 1 refills | Status: AC

## 2023-12-02 ENCOUNTER — Other Ambulatory Visit: Payer: Self-pay | Admitting: Internal Medicine

## 2023-12-02 ENCOUNTER — Other Ambulatory Visit: Payer: Self-pay | Admitting: Physician Assistant

## 2023-12-02 DIAGNOSIS — F3170 Bipolar disorder, currently in remission, most recent episode unspecified: Secondary | ICD-10-CM

## 2023-12-02 DIAGNOSIS — I10 Essential (primary) hypertension: Secondary | ICD-10-CM

## 2023-12-02 NOTE — Telephone Encounter (Signed)
 Requested medications are due for refill today.  yes  Requested medications are on the active medications list.  yes  Last refill. 07/09/2023 #270 0 rf  Future visit scheduled.   yes  Notes to clinic.  Labs are expired.    Requested Prescriptions  Pending Prescriptions Disp Refills   venlafaxine  (EFFEXOR ) 75 MG tablet [Pharmacy Med Name: VENLAFAXINE  HCL 75 MG TABLET] 270 tablet 0    Sig: Take 1 tablet (75 mg total) by mouth 3 (three) times daily with meals.     Psychiatry: Antidepressants - SNRI - desvenlafaxine & venlafaxine  Failed - 12/02/2023  3:46 PM      Failed - Cr in normal range and within 360 days    Creat  Date Value Ref Range Status  12/24/2020 1.57 (H) 0.50 - 1.05 mg/dL Final   Creatinine, Ser  Date Value Ref Range Status  02/17/2023 1.54 (H) 0.57 - 1.00 mg/dL Final         Failed - Lipid Panel in normal range within the last 12 months    Cholesterol, Total  Date Value Ref Range Status  11/09/2018 194 100 - 199 mg/dL Final   Cholesterol  Date Value Ref Range Status  09/15/2022 153 0 - 200 Final   LDL Cholesterol (Calc)  Date Value Ref Range Status  12/24/2020 98 mg/dL (calc) Final    Comment:    Reference range: <100 . Desirable range <100 mg/dL for primary prevention;   <70 mg/dL for patients with CHD or diabetic patients  with > or = 2 CHD risk factors. SABRA LDL-C is now calculated using the Martin-Hopkins  calculation, which is a validated novel method providing  better accuracy than the Friedewald equation in the  estimation of LDL-C.  Gladis APPLETHWAITE et al. SANDREA. 7986;689(80): 2061-2068  (http://education.[facility].com/faq/FAQ164)    LDL Cholesterol  Date Value Ref Range Status  09/15/2022 64  Final   HDL  Date Value Ref Range Status  09/15/2022 74 (A) 35 - 70 Final  11/09/2018 42 >39 mg/dL Final   Triglycerides  Date Value Ref Range Status  09/15/2022 69 40 - 160 Final         Passed - Last BP in normal range    BP Readings from  Last 1 Encounters:  10/06/23 124/80         Passed - Valid encounter within last 6 months    Recent Outpatient Visits           1 month ago Annual physical exam   [facility] Bernardo Fend, DO   4 months ago Primary hypertension   [facility] Bernardo Fend, DO   5 months ago Upper respiratory infection, acute   [facility] [place], [place], PA-C       Future Appointments             In 1 month Bernardo Fend, DO [facility], [facility]

## 2023-12-07 ENCOUNTER — Encounter: Payer: Self-pay | Admitting: Internal Medicine

## 2023-12-28 ENCOUNTER — Other Ambulatory Visit: Payer: Self-pay | Admitting: Physician Assistant

## 2023-12-28 ENCOUNTER — Other Ambulatory Visit: Payer: Self-pay | Admitting: Internal Medicine

## 2023-12-28 DIAGNOSIS — G4700 Insomnia, unspecified: Secondary | ICD-10-CM

## 2023-12-28 DIAGNOSIS — F419 Anxiety disorder, unspecified: Secondary | ICD-10-CM

## 2023-12-30 NOTE — Telephone Encounter (Signed)
 Requested medication (s) are due for refill today: na   Requested medication (s) are on the active medication list: yes   Last refill:  10/06/23- 04/03/24 #90 0 refills  Future visit scheduled: yes in 2 weeks   Notes to clinic:  not delegated per protocol. Do you want to refill Rx?     Requested Prescriptions  Pending Prescriptions Disp Refills   zolpidem  (AMBIEN ) 10 MG tablet [Pharmacy Med Name: ZOLPIDEM  TARTRATE 10 MG TABLET] 90 tablet 0    Sig: TAKE 1 TABLET BY MOUTH AT BEDTIME AS NEEDED FOR SLEEP.     Not Delegated - Psychiatry:  Anxiolytics/Hypnotics Failed - 12/30/2023  1:01 PM      Failed - This refill cannot be delegated      Failed - Urine Drug Screen completed in last 360 days      Passed - Valid encounter within last 6 months    Recent Outpatient Visits           2 months ago Annual physical exam   [facility] Bernardo Fend, DO   5 months ago Primary hypertension   [facility] Bernardo Fend, DO   6 months ago Upper respiratory infection, acute   [facility] [place], [place], PA-C       Future Appointments             In 2 weeks Bernardo Fend, DO [facility], [place]

## 2024-01-06 ENCOUNTER — Other Ambulatory Visit: Payer: Self-pay | Admitting: Internal Medicine

## 2024-01-06 DIAGNOSIS — E7800 Pure hypercholesterolemia, unspecified: Secondary | ICD-10-CM

## 2024-01-06 DIAGNOSIS — I2510 Atherosclerotic heart disease of native coronary artery without angina pectoris: Secondary | ICD-10-CM

## 2024-01-06 NOTE — Telephone Encounter (Signed)
 Requested medication (s) are due for refill today: yes  Requested medication (s) are on the active medication list: yes  Last refill:  07/15/23 #90 1 RF  Future visit scheduled: yes  Notes to clinic:  overdue labs   Requested Prescriptions  Pending Prescriptions Disp Refills   rosuvastatin  (CRESTOR ) 20 MG tablet [Pharmacy Med Name: ROSUVASTATIN  CALCIUM  20 MG TAB] 90 tablet 1    Sig: TAKE 1 TABLET BY MOUTH EVERY DAY     Cardiovascular:  Antilipid - Statins 2 Failed - 01/06/2024  1:57 PM      Failed - Cr in normal range and within 360 days    Creat  Date Value Ref Range Status  12/24/2020 1.57 (H) 0.50 - 1.05 mg/dL Final   Creatinine, Ser  Date Value Ref Range Status  02/17/2023 1.54 (H) 0.57 - 1.00 mg/dL Final         Failed - Lipid Panel in normal range within the last 12 months    Cholesterol, Total  Date Value Ref Range Status  11/09/2018 194 100 - 199 mg/dL Final   Cholesterol  Date Value Ref Range Status  09/15/2022 153 0 - 200 Final   LDL Cholesterol (Calc)  Date Value Ref Range Status  12/24/2020 98 mg/dL (calc) Final    Comment:    Reference range: <100 . Desirable range <100 mg/dL for primary prevention;   <70 mg/dL for patients with CHD or diabetic patients  with > or = 2 CHD risk factors. SABRA LDL-C is now calculated using the Martin-Hopkins  calculation, which is a validated novel method providing  better accuracy than the Friedewald equation in the  estimation of LDL-C.  Gladis APPLETHWAITE et al. SANDREA. 7986;689(80): 2061-2068  (http://education.[facility].com/faq/FAQ164)    LDL Cholesterol  Date Value Ref Range Status  09/15/2022 64  Final   HDL  Date Value Ref Range Status  09/15/2022 74 (A) 35 - 70 Final  11/09/2018 42 >39 mg/dL Final   Triglycerides  Date Value Ref Range Status  09/15/2022 69 40 - 160 Final         Passed - Patient is not pregnant      Passed - Valid encounter within last 12 months    Recent Outpatient Visits            3 months ago Annual physical exam   [facility] Bernardo Fend, DO   6 months ago Primary hypertension   [facility] Bernardo Fend, DO   7 months ago Upper respiratory infection, acute   [place] [facility] [place], [place], PA-C       Future Appointments             In 1 week Bernardo Fend, DO [facility], [place]

## 2024-01-12 ENCOUNTER — Ambulatory Visit: Admitting: Internal Medicine

## 2024-01-13 ENCOUNTER — Encounter: Payer: Self-pay | Admitting: Internal Medicine

## 2024-01-13 ENCOUNTER — Ambulatory Visit: Admitting: Internal Medicine

## 2024-01-13 ENCOUNTER — Telehealth: Payer: Self-pay

## 2024-01-13 VITALS — BP 128/84 | HR 88 | Temp 98.1°F | Resp 16 | Ht 60.0 in | Wt 156.4 lb

## 2024-01-13 DIAGNOSIS — N1832 Chronic kidney disease, stage 3b: Secondary | ICD-10-CM

## 2024-01-13 DIAGNOSIS — J432 Centrilobular emphysema: Secondary | ICD-10-CM

## 2024-01-13 DIAGNOSIS — Z9889 Other specified postprocedural states: Secondary | ICD-10-CM

## 2024-01-13 DIAGNOSIS — Z9089 Acquired absence of other organs: Secondary | ICD-10-CM

## 2024-01-13 DIAGNOSIS — Z1231 Encounter for screening mammogram for malignant neoplasm of breast: Secondary | ICD-10-CM

## 2024-01-13 DIAGNOSIS — I2510 Atherosclerotic heart disease of native coronary artery without angina pectoris: Secondary | ICD-10-CM

## 2024-01-13 DIAGNOSIS — I10 Essential (primary) hypertension: Secondary | ICD-10-CM

## 2024-01-13 DIAGNOSIS — Z23 Encounter for immunization: Secondary | ICD-10-CM

## 2024-01-13 DIAGNOSIS — E7800 Pure hypercholesterolemia, unspecified: Secondary | ICD-10-CM

## 2024-01-13 DIAGNOSIS — F319 Bipolar disorder, unspecified: Secondary | ICD-10-CM

## 2024-01-13 DIAGNOSIS — E876 Hypokalemia: Secondary | ICD-10-CM

## 2024-01-13 DIAGNOSIS — G4700 Insomnia, unspecified: Secondary | ICD-10-CM

## 2024-01-13 LAB — BASIC METABOLIC PANEL WITH GFR
BUN/Creatinine Ratio: 10 (calc) (ref 6–22)
BUN: 20 mg/dL (ref 7–25)
CO2: 25 mmol/L (ref 20–32)
Calcium: 9.7 mg/dL (ref 8.6–10.4)
Chloride: 108 mmol/L (ref 98–110)
Creat: 1.94 mg/dL — ABNORMAL HIGH (ref 0.50–1.05)
Glucose, Bld: 101 mg/dL — ABNORMAL HIGH (ref 65–99)
Potassium: 3.8 mmol/L (ref 3.5–5.3)
Sodium: 141 mmol/L (ref 135–146)
eGFR: 28 mL/min/1.73m2 — ABNORMAL LOW (ref >=60)

## 2024-01-13 LAB — LIPID PANEL
Cholesterol: 146 mg/dL (ref <200)
HDL: 56 mg/dL (ref >=50)
LDL Cholesterol (Calc): 65 mg/dL
Non-HDL Cholesterol (Calc): 90 mg/dL (ref <130)
Total CHOL/HDL Ratio: 2.6 (calc) (ref <5.0)
Triglycerides: 175 mg/dL — ABNORMAL HIGH (ref <150)

## 2024-01-13 MED ORDER — AMLODIPINE BESYLATE 5 MG PO TABS: 5.0000 mg | ORAL_TABLET | Freq: Every day | ORAL | 1 refills | Status: AC

## 2024-01-13 MED ORDER — ROSUVASTATIN CALCIUM 20 MG PO TABS: 20.0000 mg | ORAL_TABLET | Freq: Every day | ORAL | 1 refills | Status: AC

## 2024-01-13 MED ORDER — TRAZODONE HCL 100 MG PO TABS: 100.0000 mg | ORAL_TABLET | Freq: Every day | ORAL | 1 refills | Status: AC

## 2024-01-13 MED ORDER — LISINOPRIL 40 MG PO TABS: 40.0000 mg | ORAL_TABLET | Freq: Every day | ORAL | 1 refills | Status: AC

## 2024-01-13 MED ORDER — OLANZAPINE 15 MG PO TABS: 15.0000 mg | ORAL_TABLET | Freq: Every day | ORAL | 3 refills | Status: AC

## 2024-01-13 NOTE — Progress Notes (Signed)
 Established Patient Office Visit  Subjective    Patient ID: Stephanie Ball, female    DOB: [birth year]  Age: 68 y.o. MRN: [id number]  CC:  Chief Complaint  Patient presents with   Medical Management of Chronic Issues    3 month recheck    HPI Stephanie Ball presents to follow up on chronic medical conditions.   Discussed the use of AI scribe software for clinical note transcription with the patient, who gave verbal consent to proceed.  History of Present Illness Stephanie Ball is a 68 year old female with chronic kidney disease and parathyroid issues who presents for a six-month follow-up and lab review.  She underwent parathyroid surgery on August 4th and is feeling well post-operatively. Her potassium levels were low at 2.6 mEq/L on the day of surgery but have improved to 3.3 mEq/L. She takes over-the-counter potassium supplements due to difficulty swallowing prescribed tablets. Her calcium  levels, previously high, have improved.  She has chronic kidney disease with only one kidney and decreased renal function. Hypertension is managed with amlodipine  and lisinopril , both of which need refills.  COPD is stable with Trelegy, and she rarely uses albuterol . She has received her RSV vaccine and is due for a flu shot today.  Her mental health medications include Ambien , Buspar , venlafaxine , propranolol , and Zyprexa . She faces insurance issues with Ambien , requiring prior authorization. She also takes trazodone  and Pepcid over the counter.  Cholesterol levels are due for re-evaluation, and she is on Crestor  for management.   Hypertension: -Medications: Amlodipine  5 mg, Lisinopril  40 mg,  -Patient is compliant with above medications and reports no side effects. -Denies any SOB, CP, vision changes, LE edema or symptoms of hypotension  HLD: -Medications: Crestor  20 mg, aspirin    -Patient is compliant with above medications and reports no side effects.  -Last lipid panel:  Lipid Panel     Component Value Date/Time   CHOL 153 09/15/2022 0000   CHOL 194 11/09/2018 1420   TRIG 69 09/15/2022 0000   HDL 74 (A) 09/15/2022 0000   HDL 42 11/09/2018 1420   CHOLHDL 4.6 12/24/2020 0722   LDLCALC 64 09/15/2022 0000   LDLCALC 98 12/24/2020 0722   LABVLDL 73 (H) 11/09/2018 1420    COPD: -COPD status: stable -Current medications: Trelegy, Albuterol  PRN -Satisfied with current treatment?: yes -Oxygen use: no -Dyspnea frequency: rarely -Cough frequency: sometimes -Rescue inhaler frequency:  rarely  -Limitation of activity: no -Pneumovax: Up to Date -Influenza: due today  Bipolar 1/Anxiety/Insomnia: -Currently on Effexor  75 mg TID, Zyprexa  15 mg, Buspar  30 mg BID, Trazodone  200 mg, Ambien  10 mg at bedtime, Propanolol 20 mg PRN (takes rarely) -Patient states that her moods are stable -Needing prior auth for Ambien  dose and frequency  GERD: -Currently taking Pepcid 20 mg, symptoms well controlled   Environmental Allergies: -Currently OTC Zyrtec 10 mg, symptoms controlled   CKD3b: -Following with 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  Hyperparathyroidism: -Following with Endocrinology, just had partial parathyroidectomy in August, needs to repeat calcium  and potassium   Hx of Pre-Diabetes: -Last A1c 10/24 5.5%  Hx of Gout:  -Hasn't had a flare in 2 years  -Has Colchicine  PRN  Health Maintenance: -Blood work due -[employer], due and ordered -Cologuard positive, tried to do a colonoscopy but was unable to tolerate the medications -Lung cancer screening 10/24 Lung0RADS 1 -Flu vaccine today    Outpatient Encounter Medications as of 01/13/2024  Medication Sig   albuterol  (  VENTOLIN  HFA) 108 (90 Base) MCG/ACT inhaler Inhale 2 puffs into the lungs every 6 (six) hours as needed for wheezing or shortness of breath.   amLODipine  (NORVASC ) 5 MG tablet Take 1 tablet (5 mg total) by mouth daily.    busPIRone  (BUSPAR ) 30 MG tablet TAKE 1 TABLET BY MOUTH 2 TIMES DAILY.   clotrimazole -betamethasone  (LOTRISONE ) cream Apply topically 2 (two) times daily.   colchicine  0.6 MG tablet Take 2 tabs PO at onset of symptoms, and one tab PO daily until symptoms resolve. Repeat as needed   famotidine (PEPCID) 20 MG tablet Take 20 mg by mouth 2 (two) times daily.   Fluticasone-Umeclidin-Vilant (TRELEGY ELLIPTA ) 100-62.5-25 MCG/ACT AEPB Inhale 1 puff into the lungs daily.   hydrOXYzine  (ATARAX ) 25 MG tablet TAKE 1 TABLET (25 MG TOTAL) BY MOUTH 3 (THREE) TIMES DAILY AS NEEDED.   lisinopril  (ZESTRIL ) 40 MG tablet TAKE 1 TABLET BY MOUTH EVERY DAY   montelukast  (SINGULAIR ) 10 MG tablet Take 1 tablet (10 mg total) by mouth daily.   OLANZapine  (ZYPREXA ) 15 MG tablet TAKE 1 TABLET (15 MG TOTAL) BY MOUTH DAILY.   potassium chloride  SA (KLOR-CON  M) 20 MEQ tablet Take 20 mEq by mouth.   propranolol  (INDERAL ) 20 MG tablet TAKE 1 TABLET (20 MG TOTAL) BY MOUTH DAILY AS NEEDED (ANXIETY).   rosuvastatin  (CRESTOR ) 20 MG tablet Take 1 tablet (20 mg total) by mouth daily.   traZODone  (DESYREL ) 100 MG tablet Take 1 tablet (100 mg total) by mouth at bedtime.   venlafaxine  (EFFEXOR ) 75 MG tablet TAKE 1 TABLET (75 MG TOTAL) BY MOUTH 3 (THREE) TIMES DAILY WITH MEALS.   zolpidem  (AMBIEN ) 10 MG tablet TAKE 1 TABLET BY MOUTH AT BEDTIME AS NEEDED FOR SLEEP.   No facility-administered encounter medications on file as of 01/13/2024.    Past Medical History:  Diagnosis Date   Allergy    Bipolar disorder (HCC) 08/24/2008   Cataract    Chronic kidney disease    Depressive disorder 08/24/2008   Disorder of kidney and ureter 07/19/2009   Fibrocystic breast disease    GERD (gastroesophageal reflux disease)    Hematuria 08/24/2008   Hydronephrosis of left kidney    Hypercalcemia 07/19/2009   Hyperglycemia    Hyperlipidemia 08/24/2008   Hypertension 08/24/2008   essential, benign   Hypokalemia    Panic attack    Pure  hypercholesterolemia 01/29/2009   Seasonal allergic rhinitis     Past Surgical History:  Procedure Laterality Date   ABDOMINAL HYSTERECTOMY     abdominal:due to fibroid and endometriosis. No history of abnormal paps. Ovaries removed also.   ARTHRODESIS METATARSALPHALANGEAL JOINT (MTPJ) Left 11/19/2022   Procedure: ARTHRODESIS METATARSALPHALANGEAL JOINT (MTPJ);  Surgeon: Lennie Barter, DPM;  Location:  [facility];  Service: Orthopedics/Podiatry;  Laterality: Left;   BREAST BIOPSY Right 2012   FIBROEPITHEIAL LESION WITH FLORID DUCTAL   CESAREAN SECTION     CHOLECYSTECTOMY     no further information given   EYE SURGERY     KIDNEY SURGERY     as stated pt had kidney surgery with no further given   OOPHORECTOMY Bilateral 1996   Dr, DeFrancisco   TUBAL LIGATION      Family History  Problem Relation Age of Onset   Anuerysm Mother    Diabetes Father    Alcohol abuse Father    Cancer Paternal Grandmother        skin   Healthy Sister    Healthy Daughter    Healthy Sister  Breast cancer Neg Hx     Social History   Socioeconomic History   Marital status: Married    Spouse name: Not on file   Number of children: Not on file   Years of education: Not on file   Highest education level: 12th grade  Occupational History   Not on file  Tobacco Use   Smoking status: Some Days    Current packs/day: 0.00    Average packs/day: 0.5 packs/day for 40.0 years (20.0 ttl pk-yrs)    Types: Cigarettes    Start date: 04/09/1983    Last attempt to quit: 08/28/2022    Years since quitting: 1.3   Smokeless tobacco: Never  Vaping Use   Vaping status: Never Used  Substance and Sexual Activity   Alcohol use: No   Drug use: No   Sexual activity: Yes    Birth control/protection: None  Other Topics Concern   Not on file  Social History Narrative   Not on file   Social Drivers of Health   Financial Resource Strain: Low Risk  (10/06/2023)   Overall Financial Resource Strain (CARDIA)     Difficulty of Paying Living Expenses: Not very hard  Food Insecurity: No Food Insecurity (10/06/2023)   Hunger Vital Sign    Worried About Running Out of Food in the Last Year: Never true    Ran Out of Food in the Last Year: Never true  Transportation Needs: No Transportation Needs (06/04/2023)   Received from [facility]   PRAPARE - Transportation    In the past 12 months, has lack of transportation kept you from medical appointments or from getting medications?: No    Lack of Transportation (Non-Medical): No  Physical Activity: Inactive (10/06/2023)   Exercise Vital Sign    Days of Exercise per Week: 0 days    Minutes of Exercise per Session: 0 min  Stress: No Stress Concern Present (10/06/2023)   [employer] of Occupational Health - Occupational Stress Questionnaire    Feeling of Stress : Only a little  Social Connections: Moderately Integrated (10/06/2023)   Social Connection and Isolation Panel    Frequency of Communication with Friends and Family: More than three times a week    Frequency of Social Gatherings with Friends and Family: More than three times a week    Attends Religious Services: More than 4 times per year    Active Member of [employer] or Organizations: No    Attends Banker Meetings: Never    Marital Status: Married  Catering manager Violence: Not At Risk (10/06/2023)   Humiliation, Afraid, Rape, and Kick questionnaire    Fear of Current or Ex-Partner: No    Emotionally Abused: No    Physically Abused: No    Sexually Abused: No    Review of Systems  Respiratory:  Negative for cough, shortness of breath and wheezing.   All other systems reviewed and are negative.       Objective    BP 128/84 (Cuff Size: Large)   Pulse 88   Temp 98.1 F (36.7 C) (Oral)   Resp 16   Ht 5' (1.524 m)   Wt 156 lb 6.4 oz (70.9 kg)   BMI 30.54 kg/m   Physical Exam Constitutional:      Appearance: Normal appearance.  HENT:     Head:  Normocephalic and atraumatic.  Eyes:     Conjunctiva/sclera: Conjunctivae normal.  Cardiovascular:     Rate and Rhythm: Normal rate and  regular rhythm.  Pulmonary:     Effort: Pulmonary effort is normal.     Breath sounds: Normal breath sounds.  Musculoskeletal:     Right lower leg: No edema.     Left lower leg: No edema.  Skin:    General: Skin is warm and dry.  Neurological:     General: No focal deficit present.     Mental Status: She is alert. Mental status is at baseline.  Psychiatric:        Mood and Affect: Mood normal.        Behavior: Behavior normal.     Last CBC Lab Results  Component Value Date   WBC 9.5 02/17/2023   HGB 11.9 02/17/2023   HCT 35.4 02/17/2023   MCV 86 02/17/2023   MCH 28.7 02/17/2023   RDW 14.2 02/17/2023   PLT 353 02/17/2023   Last metabolic panel Lab Results  Component Value Date   GLUCOSE 116 (H) 02/17/2023   NA 145 (H) 02/17/2023   K 3.0 (L) 02/17/2023   CL 108 (H) 02/17/2023   CO2 22 02/17/2023   BUN 14 02/17/2023   CREATININE 1.54 (H) 02/17/2023   EGFR 37 (L) 02/17/2023   CALCIUM  10.3 02/17/2023   PROT 6.4 02/17/2023   ALBUMIN 3.9 02/17/2023   LABGLOB 2.5 02/17/2023   AGRATIO 1.8 11/09/2018   BILITOT 0.5 02/17/2023   ALKPHOS 105 02/17/2023   AST 13 02/17/2023   ALT 7 02/17/2023   ANIONGAP 11 02/01/2023   Last lipids Lab Results  Component Value Date   CHOL 153 09/15/2022   HDL 74 (A) 09/15/2022   LDLCALC 64 09/15/2022   TRIG 69 09/15/2022   CHOLHDL 4.6 12/24/2020   Last hemoglobin A1c Lab Results  Component Value Date   HGBA1C 5.5 02/17/2023   Last thyroid  functions Lab Results  Component Value Date   TSH 1.230 11/16/2022   Last vitamin D  Lab Results  Component Value Date   VD25OH 129 (H) 12/24/2020   Last vitamin B12 and Folate Lab Results  Component Value Date   VITAMINB12 148 (L) 12/24/2020        Assessment & Plan:   Assessment & Plan Chronic kidney disease status post parathyroidectomy  with hypokalemia Chronic kidney disease with improved calcium  levels post-parathyroidectomy. Persistent hypokalemia with potassium levels improving from 2.6 to 3.3 mEq/L. Currently on OTC potassium supplements due to difficulty swallowing prescribed tablets. - Order basic metabolic panel to check potassium and renal function. - Continue OTC potassium supplements.  Essential hypertension Blood pressure well-controlled at 128/84 mmHg on current medication regimen. - Refill lisinopril  and amlodipine  prescriptions.  Pure hypercholesterolemia Cholesterol levels not checked in over a year. Last lipid panel from 2022 was normal. Currently on Crestor . - Order lipid panel. - Refill Crestor  prescription.  Atherosclerotic heart disease of native coronary artery without angina No current angina symptoms. Management includes controlling cholesterol and blood pressure.  Chronic obstructive pulmonary disease (COPD) COPD well-managed with current medication regimen. Trelegy effective as albuterol  use is minimal. Up-to-date with RSV and pneumonia vaccines. - Administer flu vaccine. - Refill Trelegy prescription if needed.  Depression and bipolar disorder Managed with Buspar , Effexor , and Zyprexa . Zyprexa  due for refill. - Refill Zyprexa  prescription. - Refill Buspar  and trazodone  as needed.  Insomnia Insomnia managed with Ambien . Insurance requires prior authorization for dosage and frequency. - Send message to prior authorization team for Ambien . - Continue current Ambien  prescription.  - lisinopril  (ZESTRIL ) 40 MG tablet; Take 1 tablet (40 mg  total) by mouth daily.  Dispense: 90 tablet; Refill: 1 - amLODipine  (NORVASC ) 5 MG tablet; Take 1 tablet (5 mg total) by mouth daily.  Dispense: 90 tablet; Refill: 1 - Lipid Profile - rosuvastatin  (CRESTOR ) 20 MG tablet; Take 1 tablet (20 mg total) by mouth daily.  Dispense: 90 tablet; Refill: 1 - OLANZapine  (ZYPREXA ) 15 MG tablet; Take 1 tablet (15 mg  total) by mouth daily.  Dispense: 90 tablet; Refill: 3 - traZODone  (DESYREL ) 100 MG tablet; Take 1 tablet (100 mg total) by mouth at bedtime.  Dispense: 90 tablet; Refill: 1 - Basic Metabolic Panel (BMET) - MM 3D SCREENING MAMMOGRAM BILATERAL BREAST; Future - Flu vaccine HIGH DOSE PF(Fluzone Trivalent)  Return in about 6 months (around 07/12/2024).   Sharyle Fischer, DO

## 2024-01-13 NOTE — Telephone Encounter (Signed)
 Pt states needs PA for Ambien  most likely for quantity exemption

## 2024-01-14 ENCOUNTER — Ambulatory Visit: Payer: Self-pay | Admitting: Internal Medicine

## 2024-01-14 ENCOUNTER — Telehealth: Payer: Self-pay | Admitting: Pharmacy Technician

## 2024-01-14 ENCOUNTER — Other Ambulatory Visit (HOSPITAL_COMMUNITY): Payer: Self-pay

## 2024-01-14 NOTE — Telephone Encounter (Signed)
 Pharmacy Patient Advocate Encounter  Received notification from [facility] that Prior Authorization for Zolpidem  Tartrate 10MG  tablets  has been APPROVED from 01/14/24 to 01/13/25. Ran test claim, Copay is $0.56. This test claim was processed through [facility]- copay amounts may vary at other pharmacies due to pharmacy/plan contracts, or as the patient moves through the different stages of their insurance plan.   PA #/Case ID/Reference #: [id number]

## 2024-01-14 NOTE — Telephone Encounter (Signed)
 Pharmacy Patient Advocate Encounter   Received notification from Pt Calls Messages that prior authorization for Zolpidem  Tartrate 10MG  tablets  is required/requested.   Insurance verification completed.   The patient is insured through [facility] .   Per test claim: PA required; PA started via CoverMyMeds. KEY [id number] . Waiting for clinical questions to populate.

## 2024-01-14 NOTE — Telephone Encounter (Signed)
 PA request has been Started. New Encounter has been or will be created for follow up. For additional info see Pharmacy Prior Auth telephone encounter from 01/14/24.

## 2024-01-17 ENCOUNTER — Other Ambulatory Visit (HOSPITAL_COMMUNITY): Payer: Self-pay

## 2024-02-20 ENCOUNTER — Other Ambulatory Visit: Payer: Self-pay | Admitting: Internal Medicine

## 2024-02-20 DIAGNOSIS — F410 Panic disorder [episodic paroxysmal anxiety] without agoraphobia: Secondary | ICD-10-CM

## 2024-02-22 NOTE — Telephone Encounter (Signed)
 Requested Prescriptions  Pending Prescriptions Disp Refills   propranolol  (INDERAL ) 20 MG tablet [Pharmacy Med Name: PROPRANOLOL  20 MG TABLET] 90 tablet 1    Sig: TAKE 1 TABLET (20 MG TOTAL) BY MOUTH DAILY AS NEEDED (ANXIETY).     Cardiovascular:  Beta Blockers Passed - 02/22/2024 12:41 PM      Passed - Last BP in normal range    BP Readings from Last 1 Encounters:  01/13/24 128/84         Passed - Last Heart Rate in normal range    Pulse Readings from Last 1 Encounters:  01/13/24 88         Passed - Valid encounter within last 6 months    Recent Outpatient Visits           1 month ago Primary hypertension   [facility] Bernardo Fend, DO   4 months ago Annual physical exam   [facility] Bernardo Fend, DO   7 months ago Primary hypertension   [facility] Bernardo Fend, DO   8 months ago Upper respiratory infection, acute   [facility] [place], Janna, PA-C

## 2024-02-26 ENCOUNTER — Other Ambulatory Visit: Payer: Self-pay | Admitting: Physician Assistant

## 2024-02-26 DIAGNOSIS — F410 Panic disorder [episodic paroxysmal anxiety] without agoraphobia: Secondary | ICD-10-CM

## 2024-03-07 ENCOUNTER — Other Ambulatory Visit: Payer: Self-pay | Admitting: Internal Medicine

## 2024-03-07 DIAGNOSIS — F3170 Bipolar disorder, currently in remission, most recent episode unspecified: Secondary | ICD-10-CM

## 2024-03-08 NOTE — Telephone Encounter (Signed)
 Requested Prescriptions  Pending Prescriptions Disp Refills   venlafaxine  (EFFEXOR ) 75 MG tablet [Pharmacy Med Name: VENLAFAXINE  HCL 75 MG TABLET] 270 tablet 0    Sig: TAKE 1 TABLET (75 MG TOTAL) BY MOUTH 3 (THREE) TIMES DAILY WITH MEALS.     Psychiatry: Antidepressants - SNRI - desvenlafaxine & venlafaxine  Failed - 03/08/2024 11:14 AM      Failed - Cr in normal range and within 360 days    Creat  Date Value Ref Range Status  01/13/2024 1.94 (H) 0.50 - 1.05 mg/dL Final         Failed - Lipid Panel in normal range within the last 12 months    Cholesterol, Total  Date Value Ref Range Status  11/09/2018 194 100 - 199 mg/dL Final   Cholesterol  Date Value Ref Range Status  01/13/2024 146 <200 mg/dL Final   LDL Cholesterol (Calc)  Date Value Ref Range Status  01/13/2024 65 mg/dL (calc) Final    Comment:    Reference range: <100 . Desirable range <100 mg/dL for primary prevention;   <70 mg/dL for patients with CHD or diabetic patients  with > or = 2 CHD risk factors. SABRA LDL-C is now calculated using the Martin-Hopkins  calculation, which is a validated novel method providing  better accuracy than the Friedewald equation in the  estimation of LDL-C.  Gladis APPLETHWAITE et al. SANDREA. 7986;689(80): 2061-2068  (http://education.[facility].com/faq/FAQ164)    HDL  Date Value Ref Range Status  01/13/2024 56 > OR = 50 mg/dL Final  92/91/7979 42 >60 mg/dL Final   Triglycerides  Date Value Ref Range Status  01/13/2024 175 (H) <150 mg/dL Final         Passed - Last BP in normal range    BP Readings from Last 1 Encounters:  01/13/24 128/84         Passed - Valid encounter within last 6 months    Recent Outpatient Visits           1 month ago Primary hypertension   [facility] Bernardo Fend, DO   5 months ago Annual physical exam   [facility] Bernardo Fend, DO   8 months ago Primary hypertension   [facility] Bernardo Fend, DO   9 months ago Upper respiratory infection, acute   [facility] [place], Janna, PA-C

## 2024-03-16 ENCOUNTER — Other Ambulatory Visit: Payer: Self-pay | Admitting: Emergency Medicine

## 2024-03-16 ENCOUNTER — Telehealth: Payer: Self-pay | Admitting: Internal Medicine

## 2024-03-16 MED ORDER — TRELEGY ELLIPTA 100-62.5-25 MCG/ACT IN AEPB: 1.0000 | INHALATION_SPRAY | Freq: Every day | RESPIRATORY_TRACT | 11 refills | Status: AC

## 2024-03-16 NOTE — Telephone Encounter (Signed)
Order sent for refill

## 2024-03-16 NOTE — Telephone Encounter (Signed)
 Fluticasone-Umeclidin-Vilant (TRELEGY ELLIPTA) 100-62.5-25 MCG/ACT AEPB

## 2024-03-31 ENCOUNTER — Other Ambulatory Visit: Payer: Self-pay | Admitting: Internal Medicine

## 2024-03-31 DIAGNOSIS — G4700 Insomnia, unspecified: Secondary | ICD-10-CM

## 2024-04-03 ENCOUNTER — Other Ambulatory Visit: Payer: Self-pay | Admitting: Internal Medicine

## 2024-04-03 ENCOUNTER — Encounter: Payer: Self-pay | Admitting: Internal Medicine

## 2024-04-03 DIAGNOSIS — G4700 Insomnia, unspecified: Secondary | ICD-10-CM

## 2024-04-03 MED ORDER — ZOLPIDEM TARTRATE 10 MG PO TABS: 10.0000 mg | ORAL_TABLET | Freq: Every evening | ORAL | 0 refills | Status: AC | PRN

## 2024-04-04 NOTE — Telephone Encounter (Signed)
 Requested medications are due for refill today.  no  Requested medications are on the active medications list.  yes  Last refill. yesterday  Future visit scheduled.   yes  Notes to clinic.  Refusal not delegated.    Requested Prescriptions  Pending Prescriptions Disp Refills   zolpidem  (AMBIEN ) 10 MG tablet [Pharmacy Med Name: ZOLPIDEM  TARTRATE 10 MG TABLET] 90 tablet 0    Sig: TAKE 1 TABLET BY MOUTH EVERY DAY AT BEDTIME AS NEEDED FOR SLEEP     Not Delegated - Psychiatry:  Anxiolytics/Hypnotics Failed - 04/04/2024  5:00 PM      Failed - This refill cannot be delegated      Failed - Urine Drug Screen completed in last 360 days      Passed - Valid encounter within last 6 months    Recent Outpatient Visits           2 months ago Primary hypertension   [facility] Bernardo Fend, DO   6 months ago Annual physical exam   [facility] Bernardo Fend, DO   9 months ago Primary hypertension   [facility] Bernardo Fend, DO   10 months ago Upper respiratory infection, acute   [facility] [place], Janna, PA-C

## 2024-06-03 ENCOUNTER — Other Ambulatory Visit: Payer: Self-pay | Admitting: Internal Medicine

## 2024-06-03 DIAGNOSIS — F3170 Bipolar disorder, currently in remission, most recent episode unspecified: Secondary | ICD-10-CM

## 2024-07-12 ENCOUNTER — Ambulatory Visit: Admitting: Internal Medicine
